# Patient Record
Sex: Female | Born: 1975 | Race: Black or African American | Hispanic: No | Marital: Single | Smoking: Never smoker
Health system: Southern US, Community
[De-identification: ages and names within clinical notes are randomized; demographics above are authoritative.]

## PROBLEM LIST (undated history)

## (undated) DIAGNOSIS — T7840XA Allergy, unspecified, initial encounter: Secondary | ICD-10-CM

## (undated) DIAGNOSIS — E669 Obesity, unspecified: Secondary | ICD-10-CM

## (undated) DIAGNOSIS — I10 Essential (primary) hypertension: Secondary | ICD-10-CM

## (undated) DIAGNOSIS — J45909 Unspecified asthma, uncomplicated: Secondary | ICD-10-CM

## (undated) DIAGNOSIS — M2012 Hallux valgus (acquired), left foot: Secondary | ICD-10-CM

## (undated) DIAGNOSIS — F419 Anxiety disorder, unspecified: Secondary | ICD-10-CM

## (undated) DIAGNOSIS — R7303 Prediabetes: Secondary | ICD-10-CM

## (undated) DIAGNOSIS — K219 Gastro-esophageal reflux disease without esophagitis: Secondary | ICD-10-CM

## (undated) DIAGNOSIS — E559 Vitamin D deficiency, unspecified: Secondary | ICD-10-CM

## (undated) HISTORY — DX: Allergy, unspecified, initial encounter: T78.40XA

## (undated) HISTORY — DX: Unspecified asthma, uncomplicated: J45.909

## (undated) HISTORY — DX: Gastro-esophageal reflux disease without esophagitis: K21.9

## (undated) HISTORY — DX: Prediabetes: R73.03

## (undated) HISTORY — DX: Anxiety disorder, unspecified: F41.9

## (undated) HISTORY — DX: Essential (primary) hypertension: I10

## (undated) HISTORY — DX: Obesity, unspecified: E66.9

## (undated) HISTORY — DX: Vitamin D deficiency, unspecified: E55.9

## (undated) HISTORY — PX: OTHER SURGICAL HISTORY: SHX169

## (undated) HISTORY — PX: WISDOM TOOTH EXTRACTION: SHX21

---

## 2006-11-05 ENCOUNTER — Emergency Department: Payer: Self-pay | Admitting: Emergency Medicine

## 2006-11-15 ENCOUNTER — Emergency Department: Payer: Self-pay | Admitting: Emergency Medicine

## 2010-03-06 ENCOUNTER — Ambulatory Visit: Payer: Self-pay

## 2010-03-08 ENCOUNTER — Ambulatory Visit: Payer: Self-pay

## 2011-06-06 ENCOUNTER — Ambulatory Visit: Payer: Self-pay | Admitting: Internal Medicine

## 2011-06-18 ENCOUNTER — Encounter: Payer: Self-pay | Admitting: Internal Medicine

## 2011-06-18 ENCOUNTER — Ambulatory Visit (INDEPENDENT_AMBULATORY_CARE_PROVIDER_SITE_OTHER): Payer: 59 | Admitting: Internal Medicine

## 2011-06-18 DIAGNOSIS — Z8249 Family history of ischemic heart disease and other diseases of the circulatory system: Secondary | ICD-10-CM | POA: Insufficient documentation

## 2011-06-18 DIAGNOSIS — I10 Essential (primary) hypertension: Secondary | ICD-10-CM

## 2011-06-18 NOTE — Patient Instructions (Signed)
Your physician has requested that you have an echocardiogram. Echocardiography is a painless test that uses sound waves to create images of your heart. It provides your doctor with information about the size and shape of your heart and how well your heart's chambers and valves are working. This procedure takes approximately one hour. There are no restrictions for this procedure.  We will call you with results and will schedule a follow up at that time.

## 2011-06-18 NOTE — Assessment & Plan Note (Signed)
She will follow this along

## 2011-06-18 NOTE — Progress Notes (Signed)
  HPI  Katelyn Wilson is a 35 y.o. female Seen at the request of Dr. Darrick Huntsman because of concerns about hypertrophic cardiomyopathy in the family.  She is a single child of a patient of ours who has a diagnosis of hypertrophic cardiomyopathy with previously implanted ICD and appropriate therapy. She has 2 paternal aunts who have died suddenly one more recently and it is this latter death that precipitates this evaluation.  The patient has hypertension. She denies dyspnea however orthopnea nocturnal dyspnea or palpitations.  She has an episode of syncope remotely that occurred following a procedure. She has had no episodic lightheadedness.  past Medical history is notable for an allergic diathesis  No past medical history on file.  Past Surgical History  Procedure Date  . None     Current Outpatient Prescriptions  Medication Sig Dispense Refill  . ALPRAZolam (XANAX) 0.5 MG tablet Take 0.5 mg by mouth as needed.        . cetirizine (ZYRTEC) 10 MG tablet Take 10 mg by mouth as needed.        Marland Kitchen PROVENTIL HFA 108 (90 BASE) MCG/ACT inhaler Inhale 1 puff into the lungs as needed.        Allergies  Allergen Reactions  . Penicillins    Social history she is single she does not use cigarettes she drinks alcohol occasionally she denies use of recreational drugs.  Family history is positive for [facility] as well as diabetes hypertension and cancer  Review of Systems negative except from HPI and PMH  Physical Exam Well developed and well nourished young [place] American female appearing her stated age moderately obese in no acute distress HENT normal E scleral and icterus clear Neck Supple JVP flat; carotids brisk and full Clear to ausculation Regular rate and rhythm, no murmurs gallops or rub Soft with active bowel sounds No clubbing cyanosis and edema Alert and oriented, grossly normal motor and sensory function Skin Warm and Dry  ECGsinus rhythm at 77 Intervals 0.18/0.08/0.40 Axis  is 21 Otherwise normal  Assessment and  Plan

## 2011-06-18 NOTE — Assessment & Plan Note (Signed)
We will plan to pursue genetic testing in her father and used as a genetic screen for the rest of the family. We'll plan to undertake an echo in the patient to look for evidence of hypertrophic cardiopathy

## 2011-06-24 ENCOUNTER — Other Ambulatory Visit (INDEPENDENT_AMBULATORY_CARE_PROVIDER_SITE_OTHER): Payer: 59 | Admitting: *Deleted

## 2011-06-24 DIAGNOSIS — I369 Nonrheumatic tricuspid valve disorder, unspecified: Secondary | ICD-10-CM

## 2011-06-24 DIAGNOSIS — I379 Nonrheumatic pulmonary valve disorder, unspecified: Secondary | ICD-10-CM

## 2011-06-24 DIAGNOSIS — I10 Essential (primary) hypertension: Secondary | ICD-10-CM

## 2011-06-24 DIAGNOSIS — Z8249 Family history of ischemic heart disease and other diseases of the circulatory system: Secondary | ICD-10-CM

## 2011-07-04 ENCOUNTER — Telehealth: Payer: Self-pay | Admitting: *Deleted

## 2011-07-04 NOTE — Telephone Encounter (Signed)
Called and left msg with pt to call back regarding which genetic testing Dr. Graciela Husbands recommended.

## 2011-07-07 ENCOUNTER — Other Ambulatory Visit: Payer: Self-pay

## 2011-07-07 NOTE — Telephone Encounter (Signed)
Jasmine December spoke to pt, and gave her recommendation per Dr. Graciela Husbands.

## 2011-09-22 ENCOUNTER — Encounter: Payer: Self-pay | Admitting: Internal Medicine

## 2011-09-22 ENCOUNTER — Telehealth: Payer: Self-pay | Admitting: *Deleted

## 2011-09-22 NOTE — Telephone Encounter (Signed)
Notified pt lab results negative for hcm reflex genetic testing. Will contact her father to complete testing.

## 2011-09-23 NOTE — Telephone Encounter (Signed)
thanks

## 2011-10-23 ENCOUNTER — Ambulatory Visit: Payer: Self-pay

## 2011-11-14 ENCOUNTER — Encounter: Payer: Self-pay | Admitting: Internal Medicine

## 2011-11-14 ENCOUNTER — Ambulatory Visit (INDEPENDENT_AMBULATORY_CARE_PROVIDER_SITE_OTHER): Payer: 59 | Admitting: Internal Medicine

## 2011-11-14 ENCOUNTER — Ambulatory Visit: Payer: Self-pay | Admitting: Internal Medicine

## 2011-11-14 VITALS — BP 126/86 | HR 87 | Temp 97.9°F | Resp 16 | Ht 64.0 in | Wt 236.2 lb

## 2011-11-14 DIAGNOSIS — R05 Cough: Secondary | ICD-10-CM

## 2011-11-14 DIAGNOSIS — R059 Cough, unspecified: Secondary | ICD-10-CM

## 2011-11-14 DIAGNOSIS — H6120 Impacted cerumen, unspecified ear: Secondary | ICD-10-CM

## 2011-11-14 DIAGNOSIS — E119 Type 2 diabetes mellitus without complications: Secondary | ICD-10-CM

## 2011-11-14 DIAGNOSIS — I10 Essential (primary) hypertension: Secondary | ICD-10-CM

## 2011-11-14 DIAGNOSIS — Z309 Encounter for contraceptive management, unspecified: Secondary | ICD-10-CM

## 2011-11-14 DIAGNOSIS — J189 Pneumonia, unspecified organism: Secondary | ICD-10-CM

## 2011-11-14 LAB — POCT URINE PREGNANCY: Preg Test, Ur: NEGATIVE

## 2011-11-14 MED ORDER — BENZONATATE 200 MG PO CAPS: 200.0000 mg | ORAL_CAPSULE | Freq: Three times a day (TID) | ORAL | Status: AC | PRN

## 2011-11-14 MED ORDER — NORETHIN ACE-ETH ESTRAD-FE 1-20 MG-MCG(24) PO TABS: 1.0000 | ORAL_TABLET | Freq: Every day | ORAL | Status: DC

## 2011-11-14 MED ORDER — NEBIVOLOL HCL 5 MG PO TABS: 5.0000 mg | ORAL_TABLET | Freq: Every day | ORAL | Status: DC

## 2011-11-14 MED ORDER — DOXYCYCLINE HYCLATE 50 MG PO CAPS: 50.0000 mg | ORAL_CAPSULE | Freq: Two times a day (BID) | ORAL | Status: AC

## 2011-11-14 NOTE — Progress Notes (Signed)
Subjective:    Patient ID: Katelyn Wilson, female    DOB: 1976-03-22, 35 y.o.   MRN: [id number]  HPI  29 you AA female recently evaluated for HOCM due to FH of sudden death (father and paternal aunt), presents for followup on recent treatment for bronchitis .  She was treated on Nov 15 for bronchitis with laryngitis,  With a z pack and prednisone 6 day taper , told to increase her use of inhaler.  Still coughing at night but her wheezing has improved .  She did not have an x ray done.  Past Medical History  Diagnosis Date  . Diabetes mellitus   . Obesity   . Obesity (BMI 30-39.9)   . Anxiety    Current Outpatient Prescriptions on File Prior to Visit  Medication Sig Dispense Refill  . ALPRAZolam (XANAX) 0.5 MG tablet Take 0.5 mg by mouth as needed.        . cetirizine (ZYRTEC) 10 MG tablet Take 10 mg by mouth as needed.        Marland Kitchen PROVENTIL HFA 108 (90 BASE) MCG/ACT inhaler Inhale 1 puff into the lungs as needed.         Review of Systems  Constitutional: Negative for fever, chills and unexpected weight change.  HENT: Positive for rhinorrhea. Negative for hearing loss, ear pain, nosebleeds, congestion, sore throat, facial swelling, sneezing, mouth sores, trouble swallowing, neck pain, neck stiffness, voice change, postnasal drip, sinus pressure, tinnitus and ear discharge.   Eyes: Negative for pain, discharge, redness and visual disturbance.  Respiratory: Positive for cough. Negative for chest tightness, shortness of breath, wheezing and stridor.   Cardiovascular: Negative for chest pain, palpitations and leg swelling.  Musculoskeletal: Negative for myalgias and arthralgias.  Skin: Negative for color change and rash.  Neurological: Negative for dizziness, weakness, light-headedness and headaches.  Hematological: Negative for adenopathy.   BP 126/86  Pulse 87  Temp(Src) 97.9 F (36.6 C) (Oral)  Resp 16  Ht 5\' 4"  (1.626 m)  Wt 236 lb 4 oz (107.162 kg)  BMI 40.55 kg/m2  SpO2 99%  LMP  11/01/2011     Objective:   Physical Exam  Constitutional: She is oriented to person, place, and time. She appears well-developed and well-nourished.  HENT:  Mouth/Throat: Oropharynx is clear and moist.  Eyes: EOM are normal. Pupils are equal, round, and reactive to light. No scleral icterus.  Neck: Normal range of motion. Neck supple. No JVD present. No thyromegaly present.  Cardiovascular: Normal rate, regular rhythm, normal heart sounds and intact distal pulses.   Pulmonary/Chest: Effort normal and breath sounds normal.  Abdominal: Soft. Bowel sounds are normal. She exhibits no mass. There is no tenderness.  Musculoskeletal: Normal range of motion. She exhibits no edema.  Lymphadenopathy:    She has no cervical adenopathy.  Neurological: She is alert and oriented to person, place, and time.  Skin: Skin is warm and dry.  Psychiatric: She has a normal mood and affect.      Assessment & Plan:   Hypertension Not at goal with one medication.  Recent ECHO per patient showed mild ventricular wall thickening,  Will add bystolic.   Cough Persistent for over two weeks .  CXR and one week of empiric doxycycline ordered.  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  Will start low dose Bystolic.   Contraceptive management She is requesting to resume OCPs for contracptive management.  UPT was negative today and she is not a smoker and has no personal history of thrombosis. Rx given.    Updated Medication List Outpatient Encounter Prescriptions as of 11/14/2011  Medication Sig Dispense Refill  . ALPRAZolam (XANAX) 0.5 MG tablet Take 0.5 mg by mouth as needed.        . cetirizine (ZYRTEC) 10 MG tablet Take 10 mg by mouth as needed.        . mometasone (NASONEX) 50 MCG/ACT nasal spray Place 2 sprays into the nose daily.        . Olopatadine HCl (PATANASE) 0.6 % SOLN Place into the nose.         Marland Kitchen PROVENTIL HFA 108 (90 BASE) MCG/ACT inhaler Inhale 1 puff into the lungs as needed.      . benzonatate (TESSALON) 200 MG capsule Take 1 capsule (200 mg total) by mouth 3 (three) times daily as needed for cough.  45 capsule  1  . doxycycline (VIBRAMYCIN) 50 MG capsule Take 1 capsule (50 mg total) by mouth 2 (two) times daily.  14 capsule  0  . nebivolol (BYSTOLIC) 5 MG tablet Take 1 tablet (5 mg total) by mouth daily.  30 tablet  11  . Norethindrone Acetate-Ethinyl Estrad-FE (LOESTRIN 24 FE) 1-20 MG-MCG(24) tablet Take 1 tablet by mouth daily.  1 Package  11  . DISCONTD: Norethindrone Acetate-Ethinyl Estrad-FE (LOESTRIN 24 FE) 1-20 MG-MCG(24) tablet Take 1 tablet by mouth daily.  1 Package  11

## 2011-11-14 NOTE — Patient Instructions (Addendum)
I am treating you for pneumonia because of your persistent symptoms with doxycycline and tessalon for cough   Please go get a chest x ray at your convenience  Start your OCPS on the Sunday after your next menstrual period begins.   We are starting bystolic for your blood pressure

## 2011-11-14 NOTE — Assessment & Plan Note (Signed)
Not at goal with one medication.  Recent ECHO per patient showed mild ventricular wall thickening,  Will add bystolic.

## 2011-11-16 ENCOUNTER — Encounter: Payer: Self-pay | Admitting: Internal Medicine

## 2011-11-16 DIAGNOSIS — R05 Cough: Secondary | ICD-10-CM | POA: Insufficient documentation

## 2011-11-16 DIAGNOSIS — Z309 Encounter for contraceptive management, unspecified: Secondary | ICD-10-CM | POA: Insufficient documentation

## 2011-11-16 DIAGNOSIS — E669 Obesity, unspecified: Secondary | ICD-10-CM | POA: Insufficient documentation

## 2011-11-16 DIAGNOSIS — F419 Anxiety disorder, unspecified: Secondary | ICD-10-CM | POA: Insufficient documentation

## 2011-11-16 DIAGNOSIS — R059 Cough, unspecified: Secondary | ICD-10-CM | POA: Insufficient documentation

## 2011-11-16 DIAGNOSIS — R7303 Prediabetes: Secondary | ICD-10-CM | POA: Insufficient documentation

## 2011-11-16 NOTE — Assessment & Plan Note (Signed)
Her last A1c was 6.2 in July 2012 and urine ws negative for protein.  Reminders for annual eye exam and 3 month followups for diabetes monitoring.

## 2011-11-16 NOTE — Assessment & Plan Note (Addendum)
She is requesting to resume OCPs for contracptive management.  UPT was negative today and she is not a smoker and has no personal history of thrombosis. Rx given.

## 2011-11-16 NOTE — Assessment & Plan Note (Signed)
Persistent for over two weeks .  CXR and one week of empiric doxycycline ordered.

## 2011-11-16 NOTE — Assessment & Plan Note (Addendum)
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  Will start low dose Bystolic.

## 2011-11-17 ENCOUNTER — Telehealth: Payer: Self-pay | Admitting: Internal Medicine

## 2011-11-17 NOTE — Telephone Encounter (Signed)
Message copied by Edd Fabian on Mon Nov 17, 2011  5:01 PM ------      Message from: Duncan Dull      Created: Sun Nov 16, 2011  4:01 PM      Regarding: Needs fasting labs       Please remind Katelyn Wilson that she has not had a hgba1c, urine microalb / cr ratio , fasting lipids, and CMET since July and should have them this week and follow up in 3 months

## 2011-11-17 NOTE — Telephone Encounter (Signed)
Patient will come in Thursday for labs.

## 2011-11-18 ENCOUNTER — Telehealth: Payer: Self-pay | Admitting: Internal Medicine

## 2011-11-18 NOTE — Telephone Encounter (Signed)
Left message notifying patient.

## 2011-11-18 NOTE — Telephone Encounter (Signed)
Chest xray is fine

## 2011-11-20 ENCOUNTER — Other Ambulatory Visit (INDEPENDENT_AMBULATORY_CARE_PROVIDER_SITE_OTHER): Payer: 59 | Admitting: *Deleted

## 2011-11-20 ENCOUNTER — Telehealth: Payer: Self-pay | Admitting: *Deleted

## 2011-11-20 DIAGNOSIS — I10 Essential (primary) hypertension: Secondary | ICD-10-CM

## 2011-11-20 NOTE — Telephone Encounter (Signed)
Patient came in this morning for labs and a micoralbumin was part of the order, but it wasn't collected. Patient says that she will come in the week of Christmas to give [place] urine sample to run the micro. All of the other labs were collected and sent to [facility] at patient's request. I just wanted you to know about the urine.

## 2011-11-20 NOTE — Telephone Encounter (Signed)
Thanks. Hopefully this will save it in chart where I can find it!

## 2011-11-21 LAB — COMPREHENSIVE METABOLIC PANEL
Albumin/Globulin Ratio: 1.4 (ref 1.1–2.5)
Calcium: 8.8 mg/dL (ref 8.7–10.2)
Creatinine, Ser: 0.8 mg/dL (ref 0.57–1.00)
GFR calc non Af Amer: 96 mL/min/{1.73_m2} (ref >59)
Globulin, Total: 2.8 g/dL (ref 1.5–4.5)
Sodium: 136 mmol/L (ref 134–144)
Total Protein: 6.8 g/dL (ref 6.0–8.5)

## 2011-11-21 LAB — LIPID PANEL
Cholesterol, Total: 169 mg/dL (ref 100–199)
VLDL Cholesterol Cal: 23 mg/dL (ref 5–40)

## 2011-11-21 LAB — HEMOGLOBIN A1C: Est. average glucose Bld gHb Est-mCnc: 134 mg/dL

## 2011-12-08 ENCOUNTER — Encounter: Payer: Self-pay | Admitting: Internal Medicine

## 2011-12-11 ENCOUNTER — Encounter: Payer: Self-pay | Admitting: Internal Medicine

## 2011-12-22 ENCOUNTER — Encounter: Payer: Self-pay | Admitting: Internal Medicine

## 2011-12-29 ENCOUNTER — Other Ambulatory Visit: Payer: Self-pay | Admitting: Internal Medicine

## 2011-12-29 DIAGNOSIS — Z309 Encounter for contraceptive management, unspecified: Secondary | ICD-10-CM

## 2011-12-29 MED ORDER — NORETHIN ACE-ETH ESTRAD-FE 1-20 MG-MCG(24) PO TABS: 1.0000 | ORAL_TABLET | Freq: Every day | ORAL | Status: DC

## 2011-12-29 MED ORDER — NEBIVOLOL HCL 5 MG PO TABS: 5.0000 mg | ORAL_TABLET | Freq: Every day | ORAL | Status: DC

## 2011-12-29 NOTE — Telephone Encounter (Signed)
[phone] Pt called her rx company has changed she needs 90 rx for bystolic and lostrine 24   Please advise pt when these are done and she will come by to pick them up

## 2011-12-29 NOTE — Telephone Encounter (Signed)
Patient is now required to have 90 day supply on rxs. She wants to pick these up. Please call when ready.

## 2011-12-30 NOTE — Telephone Encounter (Signed)
Patient needs written script for the bystolic. She needs it for mail order.

## 2011-12-31 ENCOUNTER — Telehealth: Payer: Self-pay | Admitting: *Deleted

## 2011-12-31 DIAGNOSIS — Z309 Encounter for contraceptive management, unspecified: Secondary | ICD-10-CM

## 2011-12-31 MED ORDER — NORETHIN ACE-ETH ESTRAD-FE 1-20 MG-MCG(24) PO TABS: 1.0000 | ORAL_TABLET | Freq: Every day | ORAL | Status: DC

## 2011-12-31 MED ORDER — NEBIVOLOL HCL 5 MG PO TABS: 5.0000 mg | ORAL_TABLET | Freq: Every day | ORAL | Status: DC

## 2011-12-31 NOTE — Telephone Encounter (Signed)
Patient notified that rxs are ready.  

## 2011-12-31 NOTE — Telephone Encounter (Signed)
Pt requests 90 day scripts for mail order, she will pick up when ready.  Note sent to Dr. Darrick Huntsman for approval.

## 2012-01-01 ENCOUNTER — Encounter: Payer: Self-pay | Admitting: Internal Medicine

## 2012-01-01 ENCOUNTER — Ambulatory Visit (INDEPENDENT_AMBULATORY_CARE_PROVIDER_SITE_OTHER): Payer: 59 | Admitting: Internal Medicine

## 2012-01-01 VITALS — BP 108/88 | HR 72 | Temp 99.1°F | Wt 236.0 lb

## 2012-01-01 DIAGNOSIS — M549 Dorsalgia, unspecified: Secondary | ICD-10-CM

## 2012-01-01 DIAGNOSIS — M62830 Muscle spasm of back: Secondary | ICD-10-CM

## 2012-01-01 DIAGNOSIS — M5380 Other specified dorsopathies, site unspecified: Secondary | ICD-10-CM

## 2012-01-01 DIAGNOSIS — E119 Type 2 diabetes mellitus without complications: Secondary | ICD-10-CM

## 2012-01-01 DIAGNOSIS — I10 Essential (primary) hypertension: Secondary | ICD-10-CM

## 2012-01-01 LAB — POCT URINALYSIS DIPSTICK
Bilirubin, UA: NEGATIVE
Glucose, UA: NEGATIVE
Nitrite, UA: NEGATIVE
Urobilinogen, UA: 0.2

## 2012-01-01 MED ORDER — HYDROCHLOROTHIAZIDE 12.5 MG PO TABS: 12.5000 mg | ORAL_TABLET | Freq: Every day | ORAL | Status: DC

## 2012-01-01 MED ORDER — METHOCARBAMOL 750 MG PO TABS: 750.0000 mg | ORAL_TABLET | Freq: Four times a day (QID) | ORAL | Status: AC

## 2012-01-01 NOTE — Progress Notes (Signed)
Subjective:    Patient ID: Katelyn Wilson, female    DOB: Jan 21, 1976, 36 y.o.   MRN: [id number]  HPIr, J. this is a healthy 36 year old African American female with a history of obesity who presents with right-sided back pain lasting one week. Patient states the pain occurred after falling while walking her dog. She fell to her knees and did does remember stretching out the right side since the dog was on a leash the help of the right hand. Since then she's had pain in the upper upper right midthoracic region which is aggravated by lying down at night. It is occasionally stabbing in nature and precipitated by sudden movements or coughing. She denies any history of kidney stones or hematuria and she has no history of abdominal pain or nausea. Past Medical History  Diagnosis Date  . Diabetes mellitus   . Obesity   . Obesity (BMI 30-39.9)   . Anxiety   . Hypertension    Current Outpatient Prescriptions on File Prior to Visit  Medication Sig Dispense Refill  . ALPRAZolam (XANAX) 0.5 MG tablet Take 0.5 mg by mouth as needed.        . cetirizine (ZYRTEC) 10 MG tablet Take 10 mg by mouth as needed.        . mometasone (NASONEX) 50 MCG/ACT nasal spray Place 2 sprays into the nose daily.        . nebivolol (BYSTOLIC) 5 MG tablet Take 1 tablet (5 mg total) by mouth daily.  90 tablet  3  . Norethindrone Acetate-Ethinyl Estrad-FE (LOESTRIN 24 FE) 1-20 MG-MCG(24) tablet Take 1 tablet by mouth daily.  3 Package  3  . Olopatadine HCl (PATANASE) 0.6 % SOLN Place into the nose.        Marland Kitchen PROVENTIL HFA 108 (90 BASE) MCG/ACT inhaler Inhale 1 puff into the lungs as needed.          Review of Systems  Constitutional: Negative for fever, chills and unexpected weight change.  HENT: Negative for hearing loss, ear pain, nosebleeds, congestion, sore throat, facial swelling, rhinorrhea, sneezing, mouth sores, trouble swallowing, neck pain, neck stiffness, voice change, postnasal drip, sinus pressure, tinnitus and ear  discharge.   Eyes: Negative for pain, discharge, redness and visual disturbance.  Respiratory: Negative for cough, chest tightness, shortness of breath, wheezing and stridor.   Cardiovascular: Negative for chest pain, palpitations and leg swelling.  Musculoskeletal: Positive for back pain. Negative for myalgias and arthralgias.  Skin: Negative for color change and rash.  Neurological: Negative for dizziness, weakness, light-headedness and headaches.  Hematological: Negative for adenopathy.       Objective:   Physical Exam  Constitutional: She is oriented to person, place, and time. She appears well-developed and well-nourished.  HENT:  Mouth/Throat: Oropharynx is clear and moist.  Eyes: EOM are normal. Pupils are equal, round, and reactive to light. No scleral icterus.  Neck: Normal range of motion. Neck supple. No JVD present. No thyromegaly present.  Cardiovascular: Normal rate, regular rhythm, normal heart sounds and intact distal pulses.   Pulmonary/Chest: Effort normal and breath sounds normal.  Abdominal: Soft. Bowel sounds are normal. She exhibits no mass. There is no tenderness.  Musculoskeletal: Normal range of motion. She exhibits tenderness. She exhibits no edema.       Arms: Lymphadenopathy:    She has no cervical adenopathy.  Neurological: She is alert and oriented to person, place, and time.  Skin: Skin is warm and dry.  Psychiatric: She has a normal  mood and affect.          Assessment & Plan:   Spasm of muscle, back Secondary to recent fall. Continue nonsteroidal anti-inflammatories and add muscle relaxant. Also try alternating ice and heat for 15 minutes a time to help the muscle flaps.  Hypertension Well controlled on current regimen no changes today.  Diabetes mellitus type 2, diet-controlled Well-controlled on diet alone. Hemoglobin A1c was 6.3 in December. Patient encouraged to continue to exercise and follow glycemic index diet.    Updated  Medication List Outpatient Encounter Prescriptions as of 01/01/2012  Medication Sig Dispense Refill  . ALPRAZolam (XANAX) 0.5 MG tablet Take 0.5 mg by mouth as needed.        . cetirizine (ZYRTEC) 10 MG tablet Take 10 mg by mouth as needed.        . mometasone (NASONEX) 50 MCG/ACT nasal spray Place 2 sprays into the nose daily.        . nebivolol (BYSTOLIC) 5 MG tablet Take 1 tablet (5 mg total) by mouth daily.  90 tablet  3  . Norethindrone Acetate-Ethinyl Estrad-FE (LOESTRIN 24 FE) 1-20 MG-MCG(24) tablet Take 1 tablet by mouth daily.  3 Package  3  . Olopatadine HCl (PATANASE) 0.6 % SOLN Place into the nose.        Marland Kitchen PROVENTIL HFA 108 (90 BASE) MCG/ACT inhaler Inhale 1 puff into the lungs as needed.      . hydrochlorothiazide (HYDRODIURIL) 12.5 MG tablet Take 1 tablet (12.5 mg total) by mouth daily.  30 tablet  3  . methocarbamol (ROBAXIN-750) 750 MG tablet Take 1 tablet (750 mg total) by mouth 4 (four) times daily.  30 tablet  1

## 2012-01-01 NOTE — Patient Instructions (Signed)
You pulled a muscle,  But we are going to be careful and rule out UTI and gallbladder disease before you go.  I will call you with the results.    Ibuprofen 800 mg every 8 hours,  Add tylenol 500 mg to ibuprofern for pain relief  Robaxin,  Muscle relaxer at night

## 2012-01-02 ENCOUNTER — Encounter: Payer: Self-pay | Admitting: Internal Medicine

## 2012-01-02 DIAGNOSIS — M62830 Muscle spasm of back: Secondary | ICD-10-CM | POA: Insufficient documentation

## 2012-01-02 NOTE — Telephone Encounter (Signed)
Pt came in yesterday, picked up scripts.

## 2012-01-02 NOTE — Assessment & Plan Note (Signed)
Secondary to recent fall. Continue nonsteroidal anti-inflammatories and add muscle relaxant. Also try alternating ice and heat for 15 minutes a time to help the muscle flaps.

## 2012-01-02 NOTE — Assessment & Plan Note (Signed)
Well controlled on current regimen.  no changes today.   

## 2012-01-02 NOTE — Assessment & Plan Note (Signed)
Well-controlled on diet alone. Hemoglobin A1c was 6.3 in December. Patient encouraged to continue to exercise and follow glycemic index diet.

## 2012-01-03 LAB — CULTURE, URINE COMPREHENSIVE: Organism ID, Bacteria: NO GROWTH

## 2012-01-14 ENCOUNTER — Telehealth: Payer: Self-pay | Admitting: Internal Medicine

## 2012-01-14 NOTE — Telephone Encounter (Signed)
Advised pt.  She says she is still having some pain from her pulled muscle, I advised her pulled muscles can take  weeks to feel better.

## 2012-01-14 NOTE — Telephone Encounter (Signed)
Her recent labs were normal.

## 2012-01-14 NOTE — Telephone Encounter (Signed)
Left message on machine asking that pt call back.

## 2012-02-13 ENCOUNTER — Encounter: Payer: Self-pay | Admitting: Internal Medicine

## 2012-02-26 ENCOUNTER — Telehealth: Payer: Self-pay | Admitting: Internal Medicine

## 2012-02-26 NOTE — Telephone Encounter (Signed)
Diabetes remains well controlled.She will need to add fasting lipids and urine microalb/cr ratio to next lab draw in 3 months along with cmet and hgba1c. And will need to followup with me then , unless she is scheduled to see me soon.

## 2012-02-27 NOTE — Telephone Encounter (Signed)
Patient notified.  She has an appt next Tuesday.

## 2012-03-02 ENCOUNTER — Encounter: Payer: Self-pay | Admitting: Internal Medicine

## 2012-03-02 ENCOUNTER — Ambulatory Visit (INDEPENDENT_AMBULATORY_CARE_PROVIDER_SITE_OTHER): Payer: 59 | Admitting: Internal Medicine

## 2012-03-02 VITALS — BP 120/84 | HR 88 | Temp 98.3°F | Wt 237.0 lb

## 2012-03-02 DIAGNOSIS — E119 Type 2 diabetes mellitus without complications: Secondary | ICD-10-CM

## 2012-03-02 DIAGNOSIS — E669 Obesity, unspecified: Secondary | ICD-10-CM

## 2012-03-02 DIAGNOSIS — I10 Essential (primary) hypertension: Secondary | ICD-10-CM

## 2012-03-02 MED ORDER — AMLODIPINE BESYLATE 5 MG PO TABS: 5.0000 mg | ORAL_TABLET | Freq: Every day | ORAL | Status: DC

## 2012-03-02 NOTE — Patient Instructions (Signed)
Consider the Low Glycemic Index Diet and 6 smaller meals daily :   7 AM Low carbohydrate Protein  Shakes (EAS Carb Control  Or Atkins ,  Available everywhere,   In  cases at [employer]Js )  2.5 carbs  (Add or substitute a toasted sandwhich thin w/ peanut butter  = 17 net carbs )  10 AM: Protein bar by Atkins (snack size,  Chocolate lover's variety at  [employer])    Lunch: sandwich on pita bread or flatbread (Joseph's makes a pita bread and a flat bread , available at [employer] and [employer]; Toufayah makes a low carb flatbread available at [employer] and [employer]) [employer] and Mission make low carb tortillas whole wheat available at [employer]  3 PM:  Mid day :  Another protein bar,  Or a  cheese stick, fruit (cherries,  Berries, apples,  Kiwi, )  Fruit plus cheese.    6 PM  Dinner:  "mean and green:"  Meat/chicken/fish or pinto beans, salad, and green veggie : use ranch, vinagrette,  [employer], etc  Avoid [place],  [address] and [place].   9 PM snack : Breyer's low carb fudgiscle (100 cal , 3 net carbs)  or  ice cream bar (Carb Smart) Weight Watcher's ice cream bar , or your shake

## 2012-03-06 ENCOUNTER — Encounter: Payer: Self-pay | Admitting: Internal Medicine

## 2012-03-06 NOTE — Progress Notes (Signed)
Patient ID: Katelyn Wilson, female   DOB: 02-12-1976, 36 y.o.   MRN: [id number]     Patient Active Problem List  Diagnoses  . High blood pressure  . Family history of hypertrophic cardiomyopathy  . Hypertension  . Obesity (BMI 30-39.9)  . Anxiety  . Cough  . Contraceptive management  . Diabetes mellitus type 2, diet-controlled  . Back pain  . Spasm of muscle, back    Subjective:  CC:   Chief Complaint  Patient presents with  . Follow-up    HPI:   Katelyn Wilson a 36 y.o. female who presents   for six-month follow up on obesity hypertension and diet-controlled diabetes. At her last visit she was having right thoracic back pain and shoulder pain secondary to a hyperextension event which occurred while walking her dog. Her symptoms have improved but she has recently going detoxing/position the exercise class which has aggravated her symptoms. She denies any numbness or tingling loss of strength and daily pain. Her weight has been stable for the last year this apparently since her BMI is 40 and she is trying to lose weight. Her exercise habits have not been consistent and she is not following a prescribed diet.   Past Medical History  Diagnosis Date  . Diabetes mellitus   . Obesity   . Obesity (BMI 30-39.9)   . Anxiety   . Hypertension     Past Surgical History  Procedure Date  . None          The following portions of the patient's history were reviewed and updated as appropriate: Allergies, current medications, and problem list.    Review of Systems:   12 Pt  review of systems was negative except those addressed in the HPI,     History   Social History  . Marital Status: Single    Spouse Name: N/A    Number of Children: 0  . Years of Education: N/A   Occupational History  . project Surveyor, [employer]   Social History Main Topics  . Smoking status: Never Smoker   . Smokeless tobacco: Never Used  . Alcohol Use: 2.5 oz/week    5 drink(s) per week    occasional  . Drug Use: No  . Sexually Active: Not on file   Other Topics Concern  . Not on file   Social History Narrative  . No narrative on file    Objective:  BP 120/84  Pulse 88  Temp(Src) 98.3 F (36.8 C) (Oral)  Wt 237 lb (107.502 kg)  SpO2 100%  LMP 02/12/2012  General appearance: alert, cooperative and appears stated age Ears: normal TM's and external ear canals both ears Throat: lips, mucosa, and tongue normal; teeth and gums normal Neck: no adenopathy, no carotid bruit, supple, symmetrical, trachea midline and thyroid not enlarged, symmetric, no tenderness/mass/nodules Back: symmetric, no curvature. ROM normal. No CVA tenderness. Lungs: clear to auscultation bilaterally Heart: regular rate and rhythm, S1, S2 normal, no murmur, click, rub or gallop Abdomen: soft, non-tender; bowel sounds normal; no masses,  no organomegaly Pulses: 2+ and symmetric Skin: Skin color, texture, turgor normal. No rashes or lesions Lymph nodes: Cervical, supraclavicular, and axillary nodes normal.  Assessment and Plan:  Obesity (BMI 30-39.9) She has had no improvement overal in reducing BMI due to lack of consistent dietary efforts and exercise.  I have addressed  BMI and recommended a low glycemic index diet utilizing smaller more frequent meals to increase metabolism.  I have  also recommended that patient start exercising with a goal of 30 minutes of aerobic exercise a minimum of 5 days per week. Direct have recommended a goal weight loss of 4 pounds per month and we'll see her back in 3 months..    Diabetes mellitus type 2, diet-controlled Historically. well controlled. Last A1c was 6.3 in December. Triglycerides and LDL were both normal. Is normal renal function and normal foot exam. Reminders for diabetic eye exam to be done annually.    Updated Medication List Outpatient Encounter Prescriptions as of 03/02/2012  Medication Sig Dispense Refill  . ALPRAZolam (XANAX) 0.5 MG  tablet Take 0.5 mg by mouth as needed.        . cetirizine (ZYRTEC) 10 MG tablet Take 10 mg by mouth as needed.        . hydrochlorothiazide (HYDRODIURIL) 12.5 MG tablet Take 1 tablet (12.5 mg total) by mouth daily.  30 tablet  3  . mometasone (NASONEX) 50 MCG/ACT nasal spray Place 2 sprays into the nose daily.        . nebivolol (BYSTOLIC) 5 MG tablet Take 1 tablet (5 mg total) by mouth daily.  90 tablet  3  . Norethindrone Acetate-Ethinyl Estrad-FE (LOESTRIN 24 FE) 1-20 MG-MCG(24) tablet Take 1 tablet by mouth daily.  3 Package  3  . Olopatadine HCl (PATANASE) 0.6 % SOLN Place into the nose.        Marland Kitchen PROVENTIL HFA 108 (90 BASE) MCG/ACT inhaler Inhale 1 puff into the lungs as needed.      Marland Kitchen amLODipine (NORVASC) 5 MG tablet Take 1 tablet (5 mg total) by mouth daily.  90 tablet  3     No orders of the defined types were placed in this encounter.    Return in about 3 months (around 06/02/2012).

## 2012-03-06 NOTE — Assessment & Plan Note (Signed)
Historically. well controlled. Last A1c was 6.3 in December. Triglycerides and LDL were both normal. Is normal renal function and normal foot exam. Reminders for diabetic eye exam to be done annually.

## 2012-03-06 NOTE — Assessment & Plan Note (Signed)
She has had no improvement overal in reducing BMI due to lack of consistent dietary efforts and exercise.  I have addressed  BMI and recommended a low glycemic index diet utilizing smaller more frequent meals to increase metabolism.  I have also recommended that patient start exercising with a goal of 30 minutes of aerobic exercise a minimum of 5 days per week. Direct have recommended a goal weight loss of 4 pounds per month and we'll see her back in 3 months.Marland Kitchen

## 2012-03-09 ENCOUNTER — Encounter: Payer: Self-pay | Admitting: Internal Medicine

## 2012-05-24 ENCOUNTER — Other Ambulatory Visit: Payer: Self-pay | Admitting: Internal Medicine

## 2012-06-02 ENCOUNTER — Ambulatory Visit: Payer: 59 | Admitting: Internal Medicine

## 2012-06-09 ENCOUNTER — Other Ambulatory Visit: Payer: Self-pay | Admitting: Internal Medicine

## 2012-06-09 DIAGNOSIS — E119 Type 2 diabetes mellitus without complications: Secondary | ICD-10-CM

## 2012-06-09 DIAGNOSIS — E785 Hyperlipidemia, unspecified: Secondary | ICD-10-CM

## 2012-06-09 DIAGNOSIS — Z79899 Other long term (current) drug therapy: Secondary | ICD-10-CM

## 2012-06-15 ENCOUNTER — Telehealth: Payer: Self-pay | Admitting: Internal Medicine

## 2012-06-15 NOTE — Telephone Encounter (Signed)
Diabetes is well controlled on diet alone, cholesterol all other labs fine .  Repeat A1c and  CMET in 3 months

## 2012-06-16 NOTE — Telephone Encounter (Signed)
Patient notified

## 2012-06-18 ENCOUNTER — Encounter: Payer: Self-pay | Admitting: Internal Medicine

## 2012-06-18 ENCOUNTER — Ambulatory Visit (INDEPENDENT_AMBULATORY_CARE_PROVIDER_SITE_OTHER): Payer: 59 | Admitting: Internal Medicine

## 2012-06-18 VITALS — BP 110/78 | HR 86 | Temp 98.4°F | Resp 16 | Wt 232.0 lb

## 2012-06-18 DIAGNOSIS — E1169 Type 2 diabetes mellitus with other specified complication: Secondary | ICD-10-CM

## 2012-06-18 DIAGNOSIS — E119 Type 2 diabetes mellitus without complications: Secondary | ICD-10-CM

## 2012-06-18 DIAGNOSIS — E538 Deficiency of other specified B group vitamins: Secondary | ICD-10-CM

## 2012-06-18 DIAGNOSIS — E669 Obesity, unspecified: Secondary | ICD-10-CM

## 2012-06-18 DIAGNOSIS — I10 Essential (primary) hypertension: Secondary | ICD-10-CM

## 2012-06-18 MED ORDER — HYDROCHLOROTHIAZIDE 12.5 MG PO CAPS: 12.5000 mg | ORAL_CAPSULE | ORAL | Status: DC

## 2012-06-18 MED ORDER — AMLODIPINE BESYLATE 5 MG PO TABS: 2.5000 mg | ORAL_TABLET | Freq: Every day | ORAL | Status: DC

## 2012-06-18 MED ORDER — CYANOCOBALAMIN 1000 MCG/ML IJ SOLN
1000.0000 ug | Freq: Once | INTRAMUSCULAR | Status: AC
  Administered 2012-06-18: 1000 ug via INTRAMUSCULAR

## 2012-06-18 NOTE — Patient Instructions (Addendum)
You may decrease the amlodipine to 1/2 tablet daily.  If after one week bp is still < 130/80,  You can stop it.       Consider a Low Glycemic Index Diet and eating 6 smaller meals daily .  This frequent feeding stimulates your metabolism and the lower glycemic index foods will lower your blood sugars:   This is an example of a "Low GI"  Diet:  All of the foods can be found at grocery stores and in bulk at [employer]  club   7 AM Breakfast:  Low carbohydrate Protein  Shakes (I recommend the EAS AdvantEdge "Carb Control" shakes  Or the low carb shakes by Atkins.   Both are available everywhere:  In  cases at [employer]  Or in 4 packs at grocery stores and pharmacies  2.5 carbs  (Alternative is  a toasted Arnold's Sandwhich Thin w/ peanut butter, a Begel Thin with cream cheese and salmon) or  a scrambled egg burrito made with a low carb tortilla .  Avoid cereal and bananas, oatmeal too!   10 AM: Protein bar by Atkins (the snack size,  Many varieties , available widely again)    Lunch: sandwich of [place] avocado and cheese on a lower carbohydrate pita bread, flatbread, or tortilla   (Joseph's makes a pita bread and a flat bread  50 cal and 4 net carbs ; Toufayan makes a low carb flatbread 100 cal and 9 net carbs ) and  Mission makes a low carb whole wheat tortilla  210 cal and 6 net carbs  3 PM:  Mid day :  Another protein bar,  Or a  cheese stick,  Or 1 ounce of  almonds, walnuts, pistachios, pecans, peanuts,  Macadamia nuts. Or low GI fruit serving: cherries,  Berries, whipped cream  6 PM  Dinner:  "mean and green:"  Meat/chicken/fish or a high protein legume; , with a green salad, and a low GI  Veggie (broccoli, cauliflower, green beans, spinach, brussel sprouts. Lima beans) : Avoid "Low fat dressings! They are loaded with sugar! Instead use ranch, vinagrette,  Blue cheese, etc  9 PM snack : Breyer's "low carb" fudgsicle or  ice cream bar (Carb Smart), or  Weight Watcher's ice cream bar , or another protein  shake or a serving of fresh fruit with whipped cream (Avoid bananas, pineapple, grapes  and watermelon on a regular basis because they are high in sugar   Remember that snack Substitutions should be less than 15 to 20 carbs  Per serving. Remember to subtract fiber grams to get the "net carbs."

## 2012-06-18 NOTE — Progress Notes (Signed)
Patient ID: Katelyn Wilson, female   DOB: [birth year], 36 y.o.   MRN: [id number] Patient Active Problem List  Diagnosis  . High blood pressure  . Family history of hypertrophic cardiomyopathy  . Obesity (BMI 30-39.9)  . Anxiety  . Contraceptive management  . Diabetes mellitus type 2, diet-controlled    Subjective:  CC:   Chief Complaint  Patient presents with  . Follow-up    3 month    HPI:   Katelyn L Chavisis a 36 y.o. female who presents for follow up on diet controlled diabetes, obesity, etc.  Recent labs done at [facility] are not available at time of visit but were reviewed several days ago by me and were reported as normal, well controlled, with a1c < 7.0. She has lost 5 lbs, which she attributes to initiation of an exercise program .  She she reports that up until 3 weeks ago when it started to rain a daily basis she was walking at lunchtime to 3 miles per day. She has not changed her diet very much due to her schedule and her inability to plan her meals and advance. She does not check her blood sugars on a regular basis. She denies any joint pain shortness of breath skin changes or heat or cold intolerance. She has no trouble with insomnia averages 6-8 hours of sleep per night. She admits that the low glycemic index diet that I have laid out for her at prior visit seemed impossible for her and she is more inclined to follow a Weight Watchers type of regimen, as that has worked for her in the past.   Past Medical History  Diagnosis Date  . Diabetes mellitus   . Obesity   . Obesity (BMI 30-39.9)   . Anxiety   . Hypertension     Past Surgical History  Procedure Date  . None          The following portions of the patient's history were reviewed and updated as appropriate: Allergies, current medications, and problem list.    Review of Systems:   12 Pt  review of systems was negative except those addressed in the HPI,     History   Social History  . Marital Status:  Single    Spouse Name: N/A    Number of Children: 0  . Years of Education: N/A   Occupational History  . project Surveyor, quantity   Social History Main Topics  . Smoking status: Never Smoker   . Smokeless tobacco: Never Used  . Alcohol Use: 2.5 oz/week    5 drink(s) per week     occasional  . Drug Use: No  . Sexually Active: Not on file   Other Topics Concern  . Not on file   Social History Narrative  . No narrative on file    Objective:  BP 110/78  Pulse 86  Temp 98.4 F (36.9 C) (Oral)  Resp 16  Wt 232 lb (105.235 kg)  SpO2 97%  LMP 05/23/2012  General appearance: alert, cooperative and appears stated age Ears: normal TM's and external ear canals both ears Throat: lips, mucosa, and tongue normal; teeth and gums normal Neck: no adenopathy, no carotid bruit, supple, symmetrical, trachea midline and thyroid not enlarged, symmetric, no tenderness/mass/nodules Back: symmetric, no curvature. ROM normal. No CVA tenderness. Lungs: clear to auscultation bilaterally Heart: regular rate and rhythm, S1, S2 normal, no murmur, click, rub or gallop Abdomen: soft, non-tender; bowel sounds normal; no masses,  no  organomegaly Pulses: 2+ and symmetric Skin: Skin color, texture, turgor normal. No rashes or lesions Lymph nodes: Cervical, supraclavicular, and axillary nodes normal.  Assessment and Plan:  High blood pressure Improved compared to last time. She is motivated to stop using medication but only when directed. We discussed decreasing her amlodipine dose by 50% today as long as her blood pressures remain less than 130 systolic I recommend he continue hydrochlorothiazide to manage any fluid retention and bloating might be causing her.  Obesity (BMI 30-39.9) With diet-controlled diabetes is complication. Secondary lifestyle environment. He continue to recommend local estimated index diet but have encouraged her to find her own version of this whether the weight watchers  manifested persistent or another outlines diet. She has lost 5 pounds. I recommended that she could be more consistent with her exercise with a goal of 30 minutes 5 times a week of aerobic exercise.  Diabetes mellitus type 2, diet-controlled Well-controlled with diet alone. Given her hypertension she will need a repeat urinalysis for protein and a change in medication to ACE inhibitor if positive prior tests have been negative. Given her age I'm reluctant to start a statin for her LDL goal of 70 or less. Depending on her current LDL we'll recommend diet versus trial of red yeast rice.   Updated Medication List Outpatient Encounter Prescriptions as of 06/18/2012  Medication Sig Dispense Refill  . ALPRAZolam (XANAX) 0.5 MG tablet Take 0.5 mg by mouth as needed.        Marland Kitchen amLODipine (NORVASC) 5 MG tablet Take 0.5 tablets (2.5 mg total) by mouth daily.  30 tablet  3  . cetirizine (ZYRTEC) 10 MG tablet Take 10 mg by mouth as needed.        . hydrochlorothiazide (MICROZIDE) 12.5 MG capsule Take 1 capsule (12.5 mg total) by mouth every morning.  30 capsule  2  . mometasone (NASONEX) 50 MCG/ACT nasal spray Place 2 sprays into the nose daily.        . Olopatadine HCl (PATANASE) 0.6 % SOLN Place into the nose.        Marland Kitchen PROVENTIL HFA 108 (90 BASE) MCG/ACT inhaler Inhale 1 puff into the lungs as needed.      Marland Kitchen DISCONTD: amLODipine (NORVASC) 5 MG tablet Take 1 tablet (5 mg total) by mouth daily.  90 tablet  3  . DISCONTD: hydrochlorothiazide (MICROZIDE) 12.5 MG capsule TAKE ONE CAPSULE BY MOUTH ONE TIME DAILY  30 capsule  2  . DISCONTD: nebivolol (BYSTOLIC) 5 MG tablet Take 1 tablet (5 mg total) by mouth daily.  90 tablet  3  . DISCONTD: Norethindrone Acetate-Ethinyl Estrad-FE (LOESTRIN 24 FE) 1-20 MG-MCG(24) tablet Take 1 tablet by mouth daily.  3 Package  3  . cyanocobalamin ((VITAMIN B-12)) injection 1,000 mcg          Orders Placed This Encounter  Procedures  . COMPLETE METABOLIC PANEL WITH GFR  .  Hemoglobin A1c    No Follow-up on file.

## 2012-06-20 ENCOUNTER — Encounter: Payer: Self-pay | Admitting: Internal Medicine

## 2012-06-20 NOTE — Assessment & Plan Note (Addendum)
Improved compared to last time. She is motivated to stop using medication but only when directed. We discussed decreasing her amlodipine dose by 50% today as long as her blood pressures remain less than 130 systolic I recommend he continue hydrochlorothiazide to manage any fluid retention and bloating might be causing her.

## 2012-06-20 NOTE — Assessment & Plan Note (Signed)
Well-controlled with diet alone. Given her hypertension she will need a repeat urinalysis for protein and a change in medication to ACE inhibitor if positive prior tests have been negative. Given her age I'm reluctant to start a statin for her LDL goal of 70 or less. Depending on her current LDL we'll recommend diet versus trial of red yeast rice.

## 2012-06-20 NOTE — Assessment & Plan Note (Signed)
With diet-controlled diabetes is complication. Secondary lifestyle environment. He continue to recommend local estimated index diet but have encouraged her to find her own version of this whether the weight watchers manifested persistent or another outlines diet. She has lost 5 pounds. I recommended that she could be more consistent with her exercise with a goal of 30 minutes 5 times a week of aerobic exercise.

## 2012-06-24 ENCOUNTER — Encounter: Payer: Self-pay | Admitting: Internal Medicine

## 2012-08-05 ENCOUNTER — Other Ambulatory Visit: Payer: Self-pay | Admitting: *Deleted

## 2012-08-05 NOTE — Telephone Encounter (Signed)
Opened in error

## 2012-08-31 ENCOUNTER — Encounter: Payer: Self-pay | Admitting: Internal Medicine

## 2012-08-31 ENCOUNTER — Ambulatory Visit (INDEPENDENT_AMBULATORY_CARE_PROVIDER_SITE_OTHER): Payer: 59 | Admitting: Internal Medicine

## 2012-08-31 VITALS — BP 106/78 | HR 102 | Temp 98.0°F | Resp 18 | Wt 233.8 lb

## 2012-08-31 DIAGNOSIS — I10 Essential (primary) hypertension: Secondary | ICD-10-CM

## 2012-08-31 DIAGNOSIS — J069 Acute upper respiratory infection, unspecified: Secondary | ICD-10-CM

## 2012-08-31 DIAGNOSIS — E119 Type 2 diabetes mellitus without complications: Secondary | ICD-10-CM

## 2012-08-31 MED ORDER — ALBUTEROL SULFATE HFA 108 (90 BASE) MCG/ACT IN AERS: 1.0000 | INHALATION_SPRAY | Freq: Four times a day (QID) | RESPIRATORY_TRACT | Status: DC

## 2012-08-31 MED ORDER — AZITHROMYCIN 500 MG PO TABS: 500.0000 mg | ORAL_TABLET | Freq: Every day | ORAL | Status: DC

## 2012-08-31 MED ORDER — HYDROCHLOROTHIAZIDE 12.5 MG PO CAPS: 12.5000 mg | ORAL_CAPSULE | ORAL | Status: DC

## 2012-08-31 MED ORDER — AMLODIPINE BESYLATE 5 MG PO TABS: 2.5000 mg | ORAL_TABLET | Freq: Every day | ORAL | Status: DC

## 2012-08-31 NOTE — Progress Notes (Signed)
Patient ID: Katelyn Wilson, female   DOB: Jul 21, 1976, 36 y.o.   MRN: [id number] Patient Active Problem List  Diagnosis  . High blood pressure  . Family history of hypertrophic cardiomyopathy  . Obesity (BMI 30-39.9)  . Anxiety  . Contraceptive management  . Diabetes mellitus type 2, diet-controlled  . URI (upper respiratory infection)    Subjective:  CC:   Chief Complaint  Patient presents with  . Sinusitis  . Cough    HPI:   Katelyn L Chavisis a 36 y.o. female who presents with a 5 day history of rhinitis,  sore throat, sinus congestion.  No fevers, myalgias, purulent sinus drainage. Minimal cough.  Has history of allergic rhinits and uses steroid nasal spray and eye drops regularly.  Past Medical History  Diagnosis Date  . Diabetes mellitus   . Obesity   . Obesity (BMI 30-39.9)   . Anxiety   . Hypertension   . allergic rhinitis     Past Surgical History  Procedure Date  . None     The following portions of the patient's history were reviewed and updated as appropriate: Allergies, current medications, and problem list.    Review of Systems:   12 Pt  review of systems was negative except those addressed in the HPI,     History   Social History  . Marital Status: Single    Spouse Name: N/A    Number of Children: 0  . Years of Education: N/A   Occupational History  . project Surveyor, quantity   Social History Main Topics  . Smoking status: Never Smoker   . Smokeless tobacco: Never Used  . Alcohol Use: 2.5 oz/week    5 drink(s) per week     occasional  . Drug Use: No  . Sexually Active: Not on file   Other Topics Concern  . Not on file   Social History Narrative  . No narrative on file    Objective:  BP 106/78  Pulse 102  Temp 98 F (36.7 C) (Oral)  Resp 18  Wt 233 lb 12 oz (106.028 kg)  SpO2 97%  LMP 08/14/2012  General appearance: alert, cooperative and appears stated age Ears: normal TM's and external ear canals both ears Throat:  lips, mucosa, and tongue normal; teeth and gums normal Neck: no adenopathy, no carotid bruit, supple, symmetrical, trachea midline and thyroid not enlarged, symmetric, no tenderness/mass/nodules Back: symmetric, no curvature. ROM normal. No CVA tenderness. Lungs: clear to auscultation bilaterally Heart: regular rate and rhythm, S1, S2 normal, no murmur, click, rub or gallop Abdomen: soft, non-tender; bowel sounds normal; no masses,  no organomegaly Pulses: 2+ and symmetric Skin: Skin color, texture, turgor normal. No rashes or lesions Lymph nodes: Cervical, supraclavicular, and axillary nodes normal.  Assessment and Plan:  Diabetes mellitus type 2, diet-controlled Repeat hgba1c is due. Last one was 6.2   URI (upper respiratory infection) .This URI is most likely viral   I have explained that in viral URIS, an antibiotic will not help the symptoms and will increase the risk of developing diarrhea.,  Continue oral and nasal decongestants,  Ibuprofen 400 mg and tylenol 650 mq 8 hrs for aches and pains,    And abx only if symptoms worsen to include fevers, facial pain, purulent sputum./drainage.   High blood pressure She has resumed amlodipine for elevated bp at recent health screening.    Updated Medication List Outpatient Encounter Prescriptions as of 08/31/2012  Medication Sig Dispense Refill  .  albuterol (PROVENTIL HFA) 108 (90 BASE) MCG/ACT inhaler Inhale 1 puff into the lungs 4 (four) times daily.  8.5 g  6  . ALPRAZolam (XANAX) 0.5 MG tablet Take 0.5 mg by mouth as needed.        Marland Kitchen amLODipine (NORVASC) 5 MG tablet Take 0.5 tablets (2.5 mg total) by mouth daily.  30 tablet  0  . cetirizine (ZYRTEC) 10 MG tablet Take 10 mg by mouth as needed.        . hydrochlorothiazide (MICROZIDE) 12.5 MG capsule Take 1 capsule (12.5 mg total) by mouth every morning.  90 capsule  3  . mometasone (NASONEX) 50 MCG/ACT nasal spray Place 2 sprays into the nose daily.        . Olopatadine HCl (PATANASE)  0.6 % SOLN Place into the nose.        Marland Kitchen DISCONTD: amLODipine (NORVASC) 5 MG tablet Take 0.5 tablets (2.5 mg total) by mouth daily.  30 tablet  3  . DISCONTD: amLODipine (NORVASC) 5 MG tablet Take 0.5 tablets (2.5 mg total) by mouth daily.  90 tablet  3  . DISCONTD: hydrochlorothiazide (MICROZIDE) 12.5 MG capsule Take 1 capsule (12.5 mg total) by mouth every morning.  30 capsule  2  . DISCONTD: PROVENTIL HFA 108 (90 BASE) MCG/ACT inhaler Inhale 1 puff into the lungs as needed.      Marland Kitchen azithromycin (ZITHROMAX) 500 MG tablet Take 1 tablet (500 mg total) by mouth daily.  7 tablet  0     No orders of the defined types were placed in this encounter.    No Follow-up on file.

## 2012-08-31 NOTE — Assessment & Plan Note (Signed)
Repeat hgba1c is due. Last one was 6.2

## 2012-08-31 NOTE — Patient Instructions (Addendum)
You have a viral  Syndrome .  The post nasal drip is causing your sore throat.  Lavage your sinuses twice daly with Simply saline nasal spray.  Use benadryl 25 mg every 8 hours and Sudafed PE 10 to 30 every 8 hours to manage the drainage and congestion.  Gargle with salt water often for the sore throat.  I am calling in Cheratussin cough syrup (has codeine) for the cough.  If the throat is no better  In 3 to 4 days OR  if you develop T > 100.4,  Green nasal discharge,  Or facial pain,  Call for an antibiotic.

## 2012-08-31 NOTE — Assessment & Plan Note (Signed)
.  This URI is most likely viral   I have explained that in viral URIS, an antibiotic will not help the symptoms and will increase the risk of developing diarrhea.,  Continue oral and nasal decongestants,  Ibuprofen 400 mg and tylenol 650 mq 8 hrs for aches and pains,    And abx only if symptoms worsen to include fevers, facial pain, purulent sputum./drainage.

## 2012-08-31 NOTE — Assessment & Plan Note (Signed)
She has resumed amlodipine for elevated bp at recent health screening.

## 2012-09-08 ENCOUNTER — Telehealth: Payer: Self-pay | Admitting: Internal Medicine

## 2012-09-08 LAB — HM PAP SMEAR: HM Pap smear: NEGATIVE

## 2012-09-08 NOTE — Telephone Encounter (Signed)
Her labs have been ordered since July.  Please clarify request. Did they not receive them?  Because we do not fax,  We use the [facility] interface,.

## 2012-09-08 NOTE — Telephone Encounter (Signed)
Patient is needing lab order faxed to [facility] [phone]

## 2012-09-08 NOTE — Telephone Encounter (Signed)
Patient needs written lab order to be faxed to [facility] at [phone]. Thanks!

## 2012-09-09 ENCOUNTER — Telehealth: Payer: Self-pay | Admitting: Internal Medicine

## 2012-09-09 NOTE — Telephone Encounter (Signed)
Message left notifying patient to call and schedule lab appt due to interface with [facility].

## 2012-09-09 NOTE — Telephone Encounter (Signed)
Pt wants to go [facility] for her labs Pt would like to go Monday or tuesday

## 2012-09-17 ENCOUNTER — Other Ambulatory Visit: Payer: Self-pay | Admitting: Internal Medicine

## 2012-09-20 ENCOUNTER — Ambulatory Visit: Payer: 59 | Admitting: Internal Medicine

## 2012-09-20 LAB — COMPREHENSIVE METABOLIC PANEL
Albumin: 4.2 g/dL (ref 3.5–5.5)
BUN: 7 mg/dL (ref 6–20)
CO2: 28 mmol/L (ref 19–28)
Calcium: 9.4 mg/dL (ref 8.7–10.2)
Chloride: 99 mmol/L (ref 97–108)
GFR calc Af Amer: 106 mL/min/{1.73_m2} (ref >59)
Glucose: 121 mg/dL — ABNORMAL HIGH (ref 65–99)
Total Bilirubin: 0.2 mg/dL (ref 0.0–1.2)
Total Protein: 7 g/dL (ref 6.0–8.5)

## 2012-09-20 LAB — LIPID PANEL W/O CHOL/HDL RATIO
Cholesterol, Total: 160 mg/dL (ref 100–199)
HDL: 55 mg/dL (ref >39)
LDL Calculated: 87 mg/dL (ref 0–99)
Triglycerides: 90 mg/dL (ref 0–149)
VLDL Cholesterol Cal: 18 mg/dL (ref 5–40)

## 2012-09-20 LAB — MICROALBUMIN / CREATININE URINE RATIO
Creatinine, Ur: 104.5 mg/dL (ref 16.0–327.0)
Microalbumin, Urine: <1 ug/mL (ref 0.0–17.0)

## 2012-10-07 ENCOUNTER — Telehealth: Payer: Self-pay | Admitting: Internal Medicine

## 2012-10-07 NOTE — Telephone Encounter (Signed)
Spoke to patient, she will make an appt for 3 months.

## 2012-10-07 NOTE — Telephone Encounter (Signed)
Pt called stating she received her labs in mail.  Pt wanted to know when she needed to come back

## 2013-06-22 ENCOUNTER — Ambulatory Visit (INDEPENDENT_AMBULATORY_CARE_PROVIDER_SITE_OTHER): Payer: 59 | Admitting: Internal Medicine

## 2013-06-22 ENCOUNTER — Encounter: Payer: Self-pay | Admitting: Internal Medicine

## 2013-06-22 VITALS — BP 106/82 | HR 78 | Temp 98.2°F | Resp 14 | Wt 229.2 lb

## 2013-06-22 DIAGNOSIS — E669 Obesity, unspecified: Secondary | ICD-10-CM

## 2013-06-22 DIAGNOSIS — E119 Type 2 diabetes mellitus without complications: Secondary | ICD-10-CM

## 2013-06-22 DIAGNOSIS — D259 Leiomyoma of uterus, unspecified: Secondary | ICD-10-CM

## 2013-06-22 DIAGNOSIS — I10 Essential (primary) hypertension: Secondary | ICD-10-CM

## 2013-06-22 MED ORDER — ALPRAZOLAM 0.5 MG PO TABS: 0.5000 mg | ORAL_TABLET | ORAL | Status: DC | PRN

## 2013-06-22 NOTE — Patient Instructions (Addendum)
You are doing great!  Our goal is to get weight under 200; then we will suspend your bp meds to see if you still need them.  We are checking hgba1c,  CMET and iron studies  Return in 3 months

## 2013-06-22 NOTE — Progress Notes (Signed)
Patient ID: Katelyn Wilson, female   DOB: Apr 30, 1976, 37 y.o.   MRN: [id number]  Patient Active Problem List   Diagnosis Date Noted  . Fibroid uterus 06/22/2013  . Contraceptive management 11/16/2011  . Diabetes mellitus type 2, diet-controlled 11/16/2011  . Obesity (BMI 30-39.9)   . Anxiety   . High blood pressure 06/18/2011  . Family history of hypertrophic cardiomyopathy 06/18/2011    Subjective:  CC:   Chief Complaint  Patient presents with  . Follow-up    BP medication    HPI:   Katelyn Wilson a 37 y.o. female who presents Follow up on chronic conditions including DM, diet controlled, HTN,  GAD,  And obesity. She is actively trying to reduce her weight using  a web based interactive program called [employer] fitness pal .  She is exercising 3 times per week using the T 25 workout program .  Has reduced the starches in her diet. She is not checking her blood sugars since her diabetes has been diet controlled. She has lost 4 pounds since September. She has no joint pain.   Past Medical History  Diagnosis Date  . Diabetes mellitus   . Obesity   . Obesity (BMI 30-39.9)   . Anxiety   . Hypertension   . allergic rhinitis     Past Surgical History  Procedure Laterality Date  . None         The following portions of the patient's history were reviewed and updated as appropriate: Allergies, current medications, and problem list.    Review of Systems:   Patient denies headache, fevers, malaise, unintentional weight loss, skin rash, eye pain, sinus congestion and sinus pain, sore throat, dysphagia,  hemoptysis , cough, dyspnea, wheezing, chest pain, palpitations, orthopnea, edema, abdominal pain, nausea, melena, diarrhea, constipation, flank pain, dysuria, hematuria, urinary  Frequency, nocturia, numbness, tingling, seizures,  Focal weakness, Loss of consciousness,  Tremor, insomnia, depression, anxiety, and suicidal ideation.     History   Social History  . Marital  Status: Single    Spouse Name: N/A    Number of Children: 0  . Years of Education: N/A   Occupational History  . project Surveyor, quantity   Social History Main Topics  . Smoking status: Never Smoker   . Smokeless tobacco: Never Used  . Alcohol Use: 2.5 oz/week    5 drink(s) per week     Comment: occasional  . Drug Use: No  . Sexually Active: Not on file   Other Topics Concern  . Not on file   Social History Narrative  . No narrative on file    Objective:  BP 106/82  Pulse 78  Temp(Src) 98.2 F (36.8 C) (Oral)  Resp 14  Wt 229 lb 4 oz (103.987 kg)  BMI 39.33 kg/m2  SpO2 97%  LMP 01/23/2013  General appearance: alert, cooperative and appears stated age Ears: normal TM's and external ear canals both ears Throat: lips, mucosa, and tongue normal; teeth and gums normal Neck: no adenopathy, no carotid bruit, supple, symmetrical, trachea midline and thyroid not enlarged, symmetric, no tenderness/mass/nodules Back: symmetric, no curvature. ROM normal. No CVA tenderness. Lungs: clear to auscultation bilaterally Heart: regular rate and rhythm, S1, S2 normal, no murmur, click, rub or gallop Abdomen: soft, non-tender; bowel sounds normal; no masses,  no organomegaly Pulses: 2+ and symmetric Skin: Skin color, texture, turgor normal. No rashes or lesions Lymph nodes: Cervical, supraclavicular, and axillary nodes normal. Foot exam:  Nails are  well trimmed,  No callouses,  Sensation intact to microfilament   Assessment and Plan:  Diabetes mellitus type 2, diet-controlled She is overdue for hemoglobin A1c. Last one was 10.1 in October. Reminded to get diabetic eye exam done. Low glycemic index diet reviewed with patient. Recommended to increase her exercise to 5 days per week for 30 minutes of aerobic exercise.  Obesity (BMI 30-39.9) I have spent 20 minutes addressing her   BMI and recommended a low glycemic index diet utilizing smaller more frequent meals to increase  metabolism.  I have also recommended that patient start exercising with a goal of 30 minutes of aerobic exercise a minimum of 5 days per week.    High blood pressure Controlled with amlodipine and hydrochlorothiazide.  A total of 40 minutes was spent with patient more than half of which was spent in counseling, reviewing records from other prviders and coordination of care.  Updated Medication List Outpatient Encounter Prescriptions as of 06/22/2013  Medication Sig Dispense Refill  . albuterol (PROVENTIL HFA) 108 (90 BASE) MCG/ACT inhaler Inhale 1 puff into the lungs 4 (four) times daily.  8.5 g  6  . amLODipine (NORVASC) 5 MG tablet Take 0.5 tablets (2.5 mg total) by mouth daily.  30 tablet  0  . cetirizine (ZYRTEC) 10 MG tablet Take 10 mg by mouth as needed.        . hydrochlorothiazide (MICROZIDE) 12.5 MG capsule Take 1 capsule (12.5 mg total) by mouth every morning.  90 capsule  3  . mometasone (NASONEX) 50 MCG/ACT nasal spray Place 2 sprays into the nose daily.        . norethindrone-ethinyl estradiol (GILDESS 1/20) 1-20 MG-MCG tablet Take 1 tablet by mouth daily.      . Olopatadine HCl (PATANASE) 0.6 % SOLN Place into the nose.        Marland Kitchen ALPRAZolam (XANAX) 0.5 MG tablet Take 1 tablet (0.5 mg total) by mouth as needed for sleep or anxiety.  30 tablet  4  . azithromycin (ZITHROMAX) 500 MG tablet Take 1 tablet (500 mg total) by mouth daily.  7 tablet  0  . [DISCONTINUED] ALPRAZolam (XANAX) 0.5 MG tablet Take 0.5 mg by mouth as needed.         No facility-administered encounter medications on file as of 06/22/2013.     Orders Placed This Encounter  Procedures  . HM DIABETES FOOT EXAM    No Follow-up on file.

## 2013-06-23 ENCOUNTER — Encounter: Payer: Self-pay | Admitting: Internal Medicine

## 2013-06-24 ENCOUNTER — Encounter: Payer: Self-pay | Admitting: Emergency Medicine

## 2013-06-24 NOTE — Assessment & Plan Note (Addendum)
I have spent 20 minutes addressing her   BMI and recommended a low glycemic index diet utilizing smaller more frequent meals to increase metabolism.  I have also recommended that patient start exercising with a goal of 30 minutes of aerobic exercise a minimum of 5 days per week.

## 2013-06-24 NOTE — Assessment & Plan Note (Signed)
Controlled with amlodipine and hydrochlorothiazide.

## 2013-06-24 NOTE — Assessment & Plan Note (Signed)
She is overdue for hemoglobin A1c. Last one was 10.1 in October. Reminded to get diabetic eye exam done. Low glycemic index diet reviewed with patient. Recommended to increase her exercise to 5 days per week for 30 minutes of aerobic exercise.

## 2013-06-29 ENCOUNTER — Encounter: Payer: Self-pay | Admitting: Internal Medicine

## 2013-06-29 ENCOUNTER — Telehealth: Payer: Self-pay | Admitting: Internal Medicine

## 2013-06-29 DIAGNOSIS — E119 Type 2 diabetes mellitus without complications: Secondary | ICD-10-CM

## 2013-06-29 NOTE — Assessment & Plan Note (Signed)
a1c 6.08 June 2013

## 2013-06-29 NOTE — Telephone Encounter (Signed)
Left message for patient to return call to office. 

## 2013-06-29 NOTE — Telephone Encounter (Signed)
Not taking iron supplement but she believes her birth control has some iron in it. Placed lab order up front for patient to pick up as requested.

## 2013-06-29 NOTE — Telephone Encounter (Signed)
[facility] labs received.  Her A1c is 6.2 indicating that her diabetes is well-controlled. However she has not had fasting lipids or a urine microscopic protein analysis in over a year. She needs to do these before next visit .  she will need an order for [facility]. I will print out a letter.   Problem #2 she is not anemic but her iron stores were borderline. Is she taking an iron supplement?

## 2013-06-29 NOTE — Telephone Encounter (Signed)
i suggest repeat the iron studies in 6 months continue current iron in birth control

## 2013-07-05 NOTE — Telephone Encounter (Signed)
Left message to call office

## 2013-07-06 NOTE — Telephone Encounter (Signed)
Message left on mobile per patients request.

## 2013-07-13 ENCOUNTER — Encounter: Payer: Self-pay | Admitting: Internal Medicine

## 2013-07-15 ENCOUNTER — Other Ambulatory Visit: Payer: Self-pay | Admitting: *Deleted

## 2013-07-17 MED ORDER — ALPRAZOLAM 0.5 MG PO TABS: 0.5000 mg | ORAL_TABLET | ORAL | Status: DC | PRN

## 2013-07-18 NOTE — Telephone Encounter (Signed)
Script faxed as requested.

## 2013-08-15 ENCOUNTER — Encounter: Payer: Self-pay | Admitting: Internal Medicine

## 2013-09-08 ENCOUNTER — Encounter: Payer: Self-pay | Admitting: Internal Medicine

## 2013-09-23 ENCOUNTER — Ambulatory Visit: Payer: 59 | Admitting: Internal Medicine

## 2013-09-27 NOTE — Telephone Encounter (Signed)
Faxed 08/10/13

## 2013-10-07 ENCOUNTER — Ambulatory Visit: Payer: 59 | Admitting: Internal Medicine

## 2013-10-13 ENCOUNTER — Other Ambulatory Visit: Payer: Self-pay

## 2013-10-16 ENCOUNTER — Other Ambulatory Visit: Payer: Self-pay | Admitting: Internal Medicine

## 2013-10-20 ENCOUNTER — Encounter: Payer: Self-pay | Admitting: *Deleted

## 2013-10-21 ENCOUNTER — Ambulatory Visit (INDEPENDENT_AMBULATORY_CARE_PROVIDER_SITE_OTHER): Payer: 59 | Admitting: Internal Medicine

## 2013-10-21 ENCOUNTER — Encounter: Payer: Self-pay | Admitting: Internal Medicine

## 2013-10-21 VITALS — BP 124/72 | HR 76 | Temp 98.1°F | Resp 12 | Ht 64.0 in | Wt 234.2 lb

## 2013-10-21 DIAGNOSIS — I10 Essential (primary) hypertension: Secondary | ICD-10-CM

## 2013-10-21 DIAGNOSIS — E119 Type 2 diabetes mellitus without complications: Secondary | ICD-10-CM

## 2013-10-21 MED ORDER — HYDROCHLOROTHIAZIDE 12.5 MG PO CAPS: 12.5000 mg | ORAL_CAPSULE | ORAL | Status: DC

## 2013-10-21 MED ORDER — AMLODIPINE BESYLATE 5 MG PO TABS: ORAL_TABLET | ORAL | Status: DC

## 2013-10-21 NOTE — Progress Notes (Signed)
Patient ID: Katelyn Wilson, female   DOB: 1976/04/17, 37 y.o.   MRN: [id number]   Patient Active Problem List   Diagnosis Date Noted  . Severe obesity (BMI >= 40) 10/23/2013  . Fibroid uterus 06/22/2013  . Contraceptive management 11/16/2011  . Type II or unspecified type diabetes mellitus without mention of complication, not stated as uncontrolled 11/16/2011  . Anxiety   . High blood pressure 06/18/2011  . Family history of hypertrophic cardiomyopathy 06/18/2011    Subjective:  CC:   Chief Complaint  Patient presents with  . Follow-up    3 month    HPI:   Katelyn L Chavisis a 37 y.o. female who presents for Follow up on diet controlled diabetes mellitus, hypertension and obesity.    Cc: Scratchy throat for the last 2 days , multiple sick contacts at work .  Denies fevers, myalgias and cough but has had some rhinitis.   DM:  Her last diabetic eye exam was in July by Dr. Alvester Morin. She has had prior Lasik procedure.  She has not been following a low GI diet or exercising for the last 4 weeks after stopping to recover from a viral URI.  Her schedule has also become complicated bc she is taking  a night time class for work.     Past Medical History  Diagnosis Date  . Diabetes mellitus   . Obesity   . Obesity (BMI 30-39.9)   . Anxiety   . Hypertension   . allergic rhinitis     Past Surgical History  Procedure Laterality Date  . None         The following portions of the patient's history were reviewed and updated as appropriate: Allergies, current medications, and problem list.    Review of Systems:   12 Pt  review of systems was negative except those addressed in the HPI,     History   Social History  . Marital Status: Single    Spouse Name: N/A    Number of Children: 0  . Years of Education: N/A   Occupational History  . project Surveyor, quantity   Social History Main Topics  . Smoking status: Never Smoker   . Smokeless tobacco: Never Used  . Alcohol Use:  2.5 oz/week    5 drink(s) per week     Comment: occasional  . Drug Use: No  . Sexual Activity: Not on file   Other Topics Concern  . Not on file   Social History Narrative  . No narrative on file    Objective:  Filed Vitals:   10/21/13 1408  BP: 124/72  Pulse: 76  Temp: 98.1 F (36.7 C)  Resp: 12     General appearance: alert, cooperative and appears stated age Ears: normal TM's and external ear canals both ears Throat: lips, mucosa, and tongue normal; teeth and gums normal Neck: no adenopathy, no carotid bruit, supple, symmetrical, trachea midline and thyroid not enlarged, symmetric, no tenderness/mass/nodules Back: symmetric, no curvature. ROM normal. No CVA tenderness. Lungs: clear to auscultation bilaterally Heart: regular rate and rhythm, S1, S2 normal, no murmur, click, rub or gallop Abdomen: soft, non-tender; bowel sounds normal; no masses,  no organomegaly Pulses: 2+ and symmetric Skin: Skin color, texture, turgor normal. No rashes or lesions Lymph nodes: Cervical, supraclavicular, and axillary nodes normal.  Assessment and Plan:  Type II or unspecified type diabetes mellitus without mention of complication, not stated as uncontrolled Well-controlled on diet alone .  hemoglobin A1c  has been consistently less than 7.0 . Patient is up-to-date on eye exams and foot exam was done today.  There is  no proteinuria on today's micro urinalysis .  Fasting lipids and A1c  have been ordered and therapy will be advised if indicated according to new ACC guidelines based on patient's 10 year risk of CAD.  I have addressed  BMI and recommended wt loss of 10% of body weigh over the next 6 months using a low glycemic index diet and regular exercise a minimum of 5 days per week.     Severe obesity (BMI >= 40) I have addressed  BMI and recommended wt loss of 10% of body weigh over the next 6 months using a low glycemic index diet and regular exercise a minimum of 5 days per week.  Since she is not exercising or follow a low GI diet, appetite suppressants have not been discussed   High blood pressure Well controlled on current regimen. Renal function has been stable, no changes until labs can be reviewed.    Updated Medication List Outpatient Encounter Prescriptions as of 10/21/2013  Medication Sig  . albuterol (PROVENTIL HFA) 108 (90 BASE) MCG/ACT inhaler Inhale 1 puff into the lungs 4 (four) times daily.  Marland Kitchen amLODipine (NORVASC) 5 MG tablet Take one-half tablet by  mouth daily  . cetirizine (ZYRTEC) 10 MG tablet Take 10 mg by mouth as needed.    . hydrochlorothiazide (MICROZIDE) 12.5 MG capsule Take 1 capsule (12.5 mg total) by mouth every morning.  . mometasone (NASONEX) 50 MCG/ACT nasal spray Place 2 sprays into the nose daily.    . norethindrone-ethinyl estradiol (GILDESS 1/20) 1-20 MG-MCG tablet Take 1 tablet by mouth daily.  . Olopatadine HCl (PATANASE) 0.6 % SOLN Place into the nose.    . [DISCONTINUED] amLODipine (NORVASC) 5 MG tablet Take one-half tablet by  mouth daily  . [DISCONTINUED] hydrochlorothiazide (MICROZIDE) 12.5 MG capsule Take 1 capsule (12.5 mg total) by mouth every morning.  . [DISCONTINUED] ALPRAZolam (XANAX) 0.5 MG tablet Take 1 tablet (0.5 mg total) by mouth as needed for sleep or anxiety.  . [DISCONTINUED] azithromycin (ZITHROMAX) 500 MG tablet Take 1 tablet (500 mg total) by mouth daily.     Orders Placed This Encounter  Procedures  . HM DIABETES EYE EXAM    No Follow-up on file.

## 2013-10-21 NOTE — Progress Notes (Signed)
Pre-visit discussion using our clinic review tool. No additional management support is needed unless otherwise documented below in the visit note.  

## 2013-10-23 ENCOUNTER — Encounter: Payer: Self-pay | Admitting: Internal Medicine

## 2013-10-23 NOTE — Assessment & Plan Note (Signed)
Well controlled on current regimen. Renal function has been stable, no changes until labs can be reviewed.

## 2013-10-23 NOTE — Assessment & Plan Note (Signed)
Well-controlled on diet alone .  hemoglobin A1c has been consistently less than 7.0 . Patient is up-to-date on eye exams and foot exam was done today.  There is  no proteinuria on today's micro urinalysis .  Fasting lipids and A1c  have been ordered and therapy will be advised if indicated according to new ACC guidelines based on patient's 10 year risk of CAD.  I have addressed  BMI and recommended wt loss of 10% of body weigh over the next 6 months using a low glycemic index diet and regular exercise a minimum of 5 days per week.

## 2013-10-23 NOTE — Assessment & Plan Note (Signed)
I have addressed  BMI and recommended wt loss of 10% of body weigh over the next 6 months using a low glycemic index diet and regular exercise a minimum of 5 days per week. Since she is not exercising or follow a low GI diet, appetite suppressants have not been discussed

## 2013-10-25 MED ORDER — PREDNISONE (PAK) 10 MG PO TABS: ORAL_TABLET | ORAL | Status: DC

## 2013-10-25 MED ORDER — BENZONATATE 200 MG PO CAPS: 200.0000 mg | ORAL_CAPSULE | Freq: Three times a day (TID) | ORAL | Status: DC | PRN

## 2013-12-09 ENCOUNTER — Other Ambulatory Visit: Payer: Self-pay | Admitting: Internal Medicine

## 2013-12-10 LAB — COMPREHENSIVE METABOLIC PANEL
ALBUMIN: 4 g/dL (ref 3.5–5.5)
ALT: 11 IU/L (ref 0–32)
AST: 12 IU/L (ref 0–40)
Albumin/Globulin Ratio: 1.4 (ref 1.1–2.5)
Alkaline Phosphatase: 43 IU/L (ref 39–117)
BUN/Creatinine Ratio: 10 (ref 8–20)
BUN: 8 mg/dL (ref 6–20)
CO2: 25 mmol/L (ref 18–29)
Calcium: 9.4 mg/dL (ref 8.7–10.2)
Chloride: 100 mmol/L (ref 97–108)
Creatinine, Ser: 0.79 mg/dL (ref 0.57–1.00)
GFR calc non Af Amer: 97 mL/min/{1.73_m2} (ref >59)
GFR, EST AFRICAN AMERICAN: 111 mL/min/{1.73_m2} (ref >59)
Globulin, Total: 2.8 g/dL (ref 1.5–4.5)
Glucose: 116 mg/dL — ABNORMAL HIGH (ref 65–99)
Potassium: 5.3 mmol/L — ABNORMAL HIGH (ref 3.5–5.2)
Sodium: 139 mmol/L (ref 134–144)
TOTAL PROTEIN: 6.8 g/dL (ref 6.0–8.5)

## 2013-12-10 LAB — TSH: TSH: 1.01 u[IU]/mL (ref 0.450–4.500)

## 2013-12-10 LAB — LIPID PANEL W/O CHOL/HDL RATIO
Cholesterol, Total: 164 mg/dL (ref 100–199)
HDL: 59 mg/dL (ref >39)
LDL CALC: 88 mg/dL (ref 0–99)
TRIGLYCERIDES: 85 mg/dL (ref 0–149)
VLDL CHOLESTEROL CAL: 17 mg/dL (ref 5–40)

## 2013-12-10 LAB — HGB A1C W/O EAG: Hgb A1c MFr Bld: 6.3 % — ABNORMAL HIGH (ref 4.8–5.6)

## 2013-12-11 ENCOUNTER — Encounter: Payer: Self-pay | Admitting: Internal Medicine

## 2014-01-27 ENCOUNTER — Ambulatory Visit (INDEPENDENT_AMBULATORY_CARE_PROVIDER_SITE_OTHER): Payer: 59 | Admitting: Internal Medicine

## 2014-01-27 ENCOUNTER — Other Ambulatory Visit: Payer: Self-pay | Admitting: Internal Medicine

## 2014-01-27 ENCOUNTER — Encounter: Payer: Self-pay | Admitting: Internal Medicine

## 2014-01-27 VITALS — BP 118/88 | HR 79 | Temp 97.9°F | Resp 16 | Wt 230.5 lb

## 2014-01-27 DIAGNOSIS — E875 Hyperkalemia: Secondary | ICD-10-CM

## 2014-01-27 DIAGNOSIS — E119 Type 2 diabetes mellitus without complications: Secondary | ICD-10-CM

## 2014-01-27 DIAGNOSIS — I10 Essential (primary) hypertension: Secondary | ICD-10-CM

## 2014-01-27 DIAGNOSIS — D649 Anemia, unspecified: Secondary | ICD-10-CM

## 2014-01-27 MED ORDER — CYANOCOBALAMIN 1000 MCG SL SUBL: 1.0000 | SUBLINGUAL_TABLET | Freq: Every day | SUBLINGUAL | Status: DC

## 2014-01-27 MED ORDER — PHENTERMINE HCL 37.5 MG PO TABS: 37.5000 mg | ORAL_TABLET | Freq: Every day | ORAL | Status: DC

## 2014-01-27 NOTE — Patient Instructions (Signed)
I am prescribing phentermine to help you lose 23 lbs by June (or August if you must dealy...) Take 1/2 tablet daily in the morning  Return before the ed of the month for vital signs.  It will not be refilled until you do this. Make sure you have developed a consistent exercise program 4 to 5 days per week/30 minutes   This is my version of a  "Low GI"  Diet:  It will still lower your blood sugars and allow you to lose 4 to 8  lbs  per month if you follow it carefully.  Your goal with exercise is a minimum of 30 minutes of aerobic exercise 5 days per week (Walking does not count once it becomes easy!)    All of the foods can be found at grocery stores and in bulk at [employer].  The Atkins protein bars and shakes are available in more varieties at [employer], [employer] and [place].     7 AM Breakfast:  Choose from the following:  Low carbohydrate Protein  Shakes (I recommend the EAS AdvantEdge "Carb Control" shakes  Or the low carb shakes by Atkins.    2.5 carbs   Arnold's "Sandwhich Thin"toasted  w/ peanut butter (no jelly: about 20 net carbs  "Bagel Thin" with cream cheese and salmon: about 20 carbs   a scrambled egg/bacon/cheese burrito made with Mission's "carb balance" whole wheat tortilla  (about 10 net carbs )   Avoid cereal and bananas, oatmeal and cream of wheat and grits. They are loaded with carbohydrates!   10 AM: high protein snack  Protein bar by Atkins (the snack size, under 200 cal, usually < 6 net carbs).    A stick of cheese:  Around 1 carb,  100 cal     Dannon Light n Fit [place] Yogurt  (80 cal, 8 carbs)  Other so called "protein bars" and Greek yogurts tend to be loaded with carbohydrates.  Remember, in food advertising, the word "energy" is synonymous for " carbohydrate."  Lunch:   A Sandwich using the bread choices listed, Can use any  Eggs,  lunchmeat, grilled meat or canned tuna), avocado, regular mayo/mustard  and cheese.  A Salad using blue cheese, ranch,  Goddess  or vinagrette,  No croutons or "confetti" and no "candied nuts" but regular nuts OK.   No pretzels or chips.  Pickles and miniature sweet peppers are a good low carb alternative that provide a "crunch"  The bread is the only source of carbohydrate in a sandwich and  can be decreased by trying some of these alternatives to traditional loaf bread  Joseph's makes a pita bread and a flat bread that are 50 cal and 4 net carbs available at [employer] and [place].  This can be toasted to use with hummous as well  Toufayan makes a low carb flatbread that's 100 cal and 9 net carbs available at [employer] and [employer]J's makes 2 sizes of  Low carb whole wheat tortilla  (The large one is 210 cal and 6 net carbs) Avoid "Low fat dressings, as well as Barry Brunner and [place] dressings They are loaded with sugar!   3 PM/ Mid day  Snack:  Consider  1 ounce of  almonds, walnuts, pistachios, pecans, peanuts,  Macadamia nuts or a nut medley.  Avoid "granola"; the dried cranberries and raisins are loaded with carbohydrates. Mixed nuts as long as there are no raisins,  cranberries or dried fruit.  6 PM  Dinner:     Meat/fowl/fish with a green salad, and either broccoli, cauliflower, green beans, spinach, brussel sprouts or  Lima beans. DO NOT BREAD THE PROTEIN!!      There is a low carb pasta by Dreamfield's that is acceptable and tastes great: only 5 digestible carbs/serving.( All grocery stores but BJs carry it )  Try Hurley Cisco Angelo's chicken piccata or chicken or eggplant parm over low carb pasta.(Lowes and BJs)   Marjory Lies Sanchez's "Carnitas" (pulled pork, no sauce,  0 carbs) or his beef pot roast to make a dinner burrito (at [employer]J's)  Pesto over low carb pasta (bj's sells a good quality pesto in the center refrigerated section of the deli   Whole wheat pasta is still full of digestible carbs and  Not as low in glycemic index as Dreamfield's.   Brown rice is still rice,  So skip the rice and noodles if you eat  [place] or [place] (or at least limit to 1/2 cup)  9 PM snack :   Breyer's "low carb" fudgsicle or  ice cream bar (Carb Smart line), or  Weight Watcher's ice cream bar , or another "no sugar added" ice cream;  a serving of fresh berries/cherries with whipped cream   Cheese or DANNON'S LlGHT N FIT [place] YOGURT  Avoid bananas, pineapple, grapes  and watermelon on a regular basis because they are high in sugar.  THINK OF THEM AS DESSERT  Remember that snack Substitutions should be less than 10 NET carbs per serving and meals < 20 carbs. Remember to subtract fiber grams to get the "net carbs."

## 2014-01-27 NOTE — Progress Notes (Signed)
Patient ID: Katelyn Wilson, female   DOB: [birth year], 38 y.o.   MRN: [id number]  Patient Active Problem List   Diagnosis Date Noted  . Severe obesity (BMI >= 40) 10/23/2013  . Fibroid uterus 06/22/2013  . Contraceptive management 11/16/2011  . Type II or unspecified type diabetes mellitus without mention of complication, not stated as uncontrolled 11/16/2011  . Anxiety   . High blood pressure 06/18/2011  . Family history of hypertrophic cardiomyopathy 06/18/2011    Subjective:  CC:   Chief Complaint  Patient presents with  . Follow-up  . Diabetes    HPI:   Katelyn Wilson is a 38 y.o. female who presents for Follow up on diet controlled diabetes mellitus, hypertension and obesity.    DM:  Her last diabetic eye exam was in July by Dr. Gloriann Loan. She has had prior Lasik procedure.  She has been trying to follow a low GI diet and walking several times a week but has not lost weight,  She has received an ultimatum  For [facility] that she will receive an obesity tax if she has not lost 10% of bodyweight by June .  Her schedule has also become complicated bc she is taking  a night time class for work.      Past Medical History  Diagnosis Date  . Diabetes mellitus   . Obesity   . Obesity (BMI 30-39.9)   . Anxiety   . Hypertension   . allergic rhinitis     Past Surgical History  Procedure Laterality Date  . None         The following portions of the patient's history were reviewed and updated as appropriate: Allergies, current medications, and problem list.    Review of Systems:   Patient denies headache, fevers, malaise, unintentional weight loss, skin rash, eye pain, sinus congestion and sinus pain, sore throat, dysphagia,  hemoptysis , cough, dyspnea, wheezing, chest pain, palpitations, orthopnea, edema, abdominal pain, nausea, melena, diarrhea, constipation, flank pain, dysuria, hematuria, urinary  Frequency, nocturia, numbness, tingling, seizures,  Focal weakness, Loss of  consciousness,  Tremor, insomnia, depression, anxiety, and suicidal ideation.     History   Social History  . Marital Status: Single    Spouse Name: N/A    Number of Children: 0  . Years of Education: N/A   Occupational History  . project Environmental education officer   Social History Main Topics  . Smoking status: Never Smoker   . Smokeless tobacco: Never Used  . Alcohol Use: 2.5 oz/week    5 drink(s) per week     Comment: occasional  . Drug Use: No  . Sexual Activity: Yes   Other Topics Concern  . Not on file   Social History Narrative  . No narrative on file    Objective:  Filed Vitals:   01/27/14 1545  BP: 118/88  Pulse: 79  Temp: 97.9 F (36.6 C)  Resp: 16     General appearance: alert, cooperative and appears stated age Ears: normal TM's and external ear canals both ears Throat: lips, mucosa, and tongue normal; teeth and gums normal Neck: no adenopathy, no carotid bruit, supple, symmetrical, trachea midline and thyroid not enlarged, symmetric, no tenderness/mass/nodules Back: symmetric, no curvature. ROM normal. No CVA tenderness. Lungs: clear to auscultation bilaterally Heart: regular rate and rhythm, S1, S2 normal, no murmur, click, rub or gallop Abdomen: soft, non-tender; bowel sounds normal; no masses,  no organomegaly Pulses: 2+ and symmetric Skin: Skin color, texture, turgor  normal. No rashes or lesions Lymph nodes: Cervical, supraclavicular, and axillary nodes normal.  Assessment and Plan:  Severe obesity (BMI >= 40) I have addressed  BMI and recommended wt loss of 10% of body weigh over the next 6 months using a low glycemic index diet and regular exercise a minimum of 5 days per week. Trial of phentermine requested as she has used it in the past [facility]   Type II or unspecified type diabetes mellitus without mention of complication, not stated as uncontrolled Well-controlled on diet alone .  hemoglobin A1c has been  consistently less than 7.0 . Patient is up-to-date on eye exams and foot exam was done today.  There is  no proteinuria on today's micro urinalysis .  Fasting lipids and A1c  have been ordered and therapy will be advised if indicated according to new ACC guidelines based on patient's 10 year risk of CAD.  I have addressed  BMI and recommended wt loss of 10% of body weight over the next 6 months using a low glycemic index diet and regular exercise a minimum of 5 days per week.       High blood pressure Well controlled on current regimen. Renal function stable, no changes today.  Cautioned about the potential effect of phentermine on bp .   She will return in 2 or 3 weeksfor vital sign check .    Updated Medication List Outpatient Encounter Prescriptions as of 01/27/2014  Medication Sig  . albuterol (PROVENTIL HFA) 108 (90 BASE) MCG/ACT inhaler Inhale 1 puff into the lungs 4 (four) times daily.  Marland Kitchen amLODipine (NORVASC) 5 MG tablet Take one-half tablet by  mouth daily  . cetirizine (ZYRTEC) 10 MG tablet Take 10 mg by mouth as needed.    . hydrochlorothiazide (MICROZIDE) 12.5 MG capsule Take 1 capsule (12.5 mg total) by mouth every morning.  . mometasone (NASONEX) 50 MCG/ACT nasal spray Place 2 sprays into the nose daily.    . norethindrone-ethinyl estradiol (GILDESS 1/20) 1-20 MG-MCG tablet Take 1 tablet by mouth daily.  . Olopatadine HCl (PATANASE) 0.6 % SOLN Place into the nose.    . benzonatate (TESSALON) 200 MG capsule Take 1 capsule (200 mg total) by mouth 3 (three) times daily as needed for cough.  . phentermine (ADIPEX-P) 37.5 MG tablet Take 1 tablet (37.5 mg total) by mouth daily before breakfast.  . predniSONE (STERAPRED UNI-PAK) 10 MG tablet 6 tablets on Day 1 , then reduce by 1 tablet daily until gone  . [DISCONTINUED] Cyanocobalamin 1000 MCG SUBL Place 1 tablet (1,000 mcg total) under the tongue daily.     Orders Placed This Encounter  Procedures  . Vitamin B12  . Basic metabolic  panel  . Vitamin B12  . Basic metabolic panel    No Follow-up on file.

## 2014-01-29 ENCOUNTER — Encounter: Payer: Self-pay | Admitting: Internal Medicine

## 2014-01-29 NOTE — Assessment & Plan Note (Signed)
Well-controlled on diet alone .  hemoglobin A1c has been consistently less than 7.0 . Patient is up-to-date on eye exams and foot exam was done today.  There is  no proteinuria on today's micro urinalysis .  Fasting lipids and A1c  have been ordered and therapy will be advised if indicated according to new ACC guidelines based on patient's 10 year risk of CAD.  I have addressed  BMI and recommended wt loss of 10% of body weight over the next 6 months using a low glycemic index diet and regular exercise a minimum of 5 days per week.

## 2014-01-29 NOTE — Assessment & Plan Note (Signed)
Well controlled on current regimen. Renal function stable, no changes today.  Cautioned about the potential effect of phentermine on bp .   She will return in 2 or 3 weeksfor vital sign check .

## 2014-01-29 NOTE — Assessment & Plan Note (Signed)
I have addressed  BMI and recommended wt loss of 10% of body weigh over the next 6 months using a low glycemic index diet and regular exercise a minimum of 5 days per week. Trial of phentermine requested as she has used it in the past when she was treated at the [facility]

## 2014-01-30 ENCOUNTER — Telehealth: Payer: Self-pay | Admitting: Internal Medicine

## 2014-01-30 NOTE — Telephone Encounter (Signed)
Relevant patient education assigned to patient using Emmi. ° °

## 2014-02-22 ENCOUNTER — Telehealth: Payer: Self-pay | Admitting: *Deleted

## 2014-02-22 ENCOUNTER — Ambulatory Visit (INDEPENDENT_AMBULATORY_CARE_PROVIDER_SITE_OTHER): Payer: 59 | Admitting: *Deleted

## 2014-02-22 VITALS — BP 118/88 | HR 102 | Resp 16

## 2014-02-22 DIAGNOSIS — I10 Essential (primary) hypertension: Secondary | ICD-10-CM

## 2014-02-22 NOTE — Telephone Encounter (Signed)
Pt presents for BP/pulse check. Has been taking Phentermine 0.5 tab daily. Reports a 10 lb weight loss so far. No complaints. Pulse 102. BP 118/88. Dr. Tullo notified 

## 2014-02-22 NOTE — Progress Notes (Signed)
Pt presents for BP/pulse check. Has been taking Phentermine 0.5 tab daily. Reports a 10 lb weight loss so far. No complaints. Pulse 102. BP 118/88. Dr. Derrel Nip notified

## 2014-02-22 NOTE — Telephone Encounter (Signed)
The wt los is great . The pulse is a little elevated from the medication but if she is not feeling jittery or short of breath she can continue it

## 2014-02-22 NOTE — Telephone Encounter (Signed)
Sent mychart message

## 2014-02-27 ENCOUNTER — Encounter: Payer: Self-pay | Admitting: Internal Medicine

## 2014-03-08 ENCOUNTER — Encounter: Payer: Self-pay | Admitting: Internal Medicine

## 2014-03-08 ENCOUNTER — Ambulatory Visit (INDEPENDENT_AMBULATORY_CARE_PROVIDER_SITE_OTHER): Payer: 59 | Admitting: Internal Medicine

## 2014-03-08 VITALS — BP 128/98 | HR 89 | Temp 98.1°F | Wt 229.0 lb

## 2014-03-08 DIAGNOSIS — L02219 Cutaneous abscess of trunk, unspecified: Secondary | ICD-10-CM

## 2014-03-08 DIAGNOSIS — L03319 Cellulitis of trunk, unspecified: Principal | ICD-10-CM

## 2014-03-08 MED ORDER — DOXYCYCLINE HYCLATE 100 MG PO TABS: 100.0000 mg | ORAL_TABLET | Freq: Two times a day (BID) | ORAL | Status: DC

## 2014-03-08 MED ORDER — OLOPATADINE HCL 0.6 % NA SOLN: NASAL | Status: DC

## 2014-03-08 MED ORDER — ALBUTEROL SULFATE HFA 108 (90 BASE) MCG/ACT IN AERS: 1.0000 | INHALATION_SPRAY | Freq: Four times a day (QID) | RESPIRATORY_TRACT | Status: DC

## 2014-03-08 MED ORDER — MOMETASONE FUROATE 50 MCG/ACT NA SUSP: 2.0000 | Freq: Every day | NASAL | Status: DC

## 2014-03-08 MED ORDER — GENTAMICIN SULFATE 0.1 % EX OINT: 1.0000 "application " | TOPICAL_OINTMENT | Freq: Three times a day (TID) | CUTANEOUS | Status: DC

## 2014-03-08 NOTE — Progress Notes (Signed)
Pre visit review using our clinic review tool, if applicable. No additional management support is needed unless otherwise documented below in the visit note. 

## 2014-03-08 NOTE — Progress Notes (Signed)
   Subjective:    Patient ID: Katelyn Wilson, female    DOB: [birth year], 38 y.o.   MRN: [id number]  HPI 38YO female presents for acute visit.  Spot on mid back for years. Occasionally drains "chunky" white fluid. Over the last few days has been tender and red. Draining purulent fluid for several days. No fever, chills.  Review of Systems  Constitutional: Negative for fever, chills and diaphoresis.  Respiratory: Negative for shortness of breath.   Cardiovascular: Negative for chest pain.  Skin: Positive for color change and wound.       Objective:    BP 128/98  Pulse 89  Temp(Src) 98.1 F (36.7 C) (Oral)  Wt 229 lb (103.874 kg)  SpO2 98%  LMP 02/01/2014 Physical Exam  Constitutional: She is oriented to person, place, and time. She appears well-developed and well-nourished. No distress.  HENT:  Head: Normocephalic and atraumatic.  Right Ear: External ear normal.  Left Ear: External ear normal.  Nose: Nose normal.  Mouth/Throat: Oropharynx is clear and moist.  Eyes: Conjunctivae are normal. Pupils are equal, round, and reactive to light. Right eye exhibits no discharge. Left eye exhibits no discharge. No scleral icterus.  Neck: Normal range of motion. Neck supple. No tracheal deviation present. No thyromegaly present.  Pulmonary/Chest: Effort normal. No accessory muscle usage. Not tachypneic. She has no decreased breath sounds. She has no rhonchi.  Musculoskeletal: Normal range of motion. She exhibits no edema and no tenderness.  Lymphadenopathy:    She has no cervical adenopathy.  Neurological: She is alert and oriented to person, place, and time. No cranial nerve deficit. She exhibits normal muscle tone. Coordination normal.  Skin: Skin is warm and dry. Lesion noted. No rash noted. She is not diaphoretic. There is erythema. No pallor.     Psychiatric: She has a normal mood and affect. Her behavior is normal. Judgment and thought content normal.          Assessment &  Plan:   Problem List Items Addressed This Visit   Cellulitis and abscess of trunk - Primary     Symptoms and exam are most consistent with infected sebaceous gland. Will start doxycycline 100 mg twice daily. Will set up dermatology evaluation for cyst removal. Patient will call if any worsening pain, redness, fever or chills.    Relevant Medications      DOXYCYCLINE HYCLATE 100 MG PO TABS      gentamicin (GARAMYCIN) ointment 0.1%   Other Relevant Orders      Ambulatory referral to Dermatology       Return if symptoms worsen or fail to improve.

## 2014-03-08 NOTE — Assessment & Plan Note (Signed)
Symptoms and exam are most consistent with infected sebaceous gland. Will start doxycycline 100 mg twice daily. Will set up dermatology evaluation for cyst removal. Patient will call if any worsening pain, redness, fever or chills.

## 2014-04-12 ENCOUNTER — Encounter: Payer: Self-pay | Admitting: Internal Medicine

## 2014-04-12 NOTE — Telephone Encounter (Signed)
See my chart message

## 2014-04-13 MED ORDER — ALPRAZOLAM 0.5 MG PO TABS: 0.5000 mg | ORAL_TABLET | ORAL | Status: DC | PRN

## 2014-07-05 ENCOUNTER — Encounter: Payer: Self-pay | Admitting: *Deleted

## 2014-07-05 DIAGNOSIS — E119 Type 2 diabetes mellitus without complications: Secondary | ICD-10-CM

## 2014-07-05 NOTE — Progress Notes (Signed)
Chart reviewed for DM bundle. Pt due for appt and fasting labs. Sent mychart message on need for appt. Orders placed

## 2014-07-06 NOTE — Telephone Encounter (Signed)
Pt called states she received an email from you.  She has scheduled her appointment, however she requests that her labs be sent to [facility].  Please advise

## 2014-07-10 LAB — HM DIABETES EYE EXAM

## 2014-07-10 NOTE — Telephone Encounter (Signed)
Called pt and notified lab order ready for pickup

## 2014-07-23 ENCOUNTER — Other Ambulatory Visit: Payer: Self-pay | Admitting: Internal Medicine

## 2014-07-24 NOTE — Telephone Encounter (Signed)
Appt sch 08/07/14

## 2014-07-28 ENCOUNTER — Other Ambulatory Visit: Payer: Self-pay | Admitting: Internal Medicine

## 2014-07-29 LAB — MICROALBUMIN / CREATININE URINE RATIO
Creatinine, Ur: 134.2 mg/dL (ref 16.0–327.0)
MICROALB/CREAT RATIO: 4.5 mg/g creat (ref 0.0–30.0)
MICROALBUM., U, RANDOM: 6 ug/mL (ref 0.0–17.0)

## 2014-07-29 LAB — LIPID PANEL W/O CHOL/HDL RATIO
Cholesterol, Total: 193 mg/dL (ref 100–199)
HDL: 58 mg/dL (ref >39)
LDL Calculated: 114 mg/dL — ABNORMAL HIGH (ref 0–99)
Triglycerides: 105 mg/dL (ref 0–149)
VLDL Cholesterol Cal: 21 mg/dL (ref 5–40)

## 2014-08-02 LAB — HEMOGLOBIN A1C: Hgb A1c MFr Bld: 6.2 % — AB (ref 4.0–6.0)

## 2014-08-04 ENCOUNTER — Telehealth: Payer: Self-pay | Admitting: Internal Medicine

## 2014-08-04 NOTE — Telephone Encounter (Signed)
Sent my chart with results. 

## 2014-08-04 NOTE — Telephone Encounter (Signed)
a1c is 6.2  Excellent  No med changes needed

## 2014-08-07 ENCOUNTER — Ambulatory Visit (INDEPENDENT_AMBULATORY_CARE_PROVIDER_SITE_OTHER): Payer: 59 | Admitting: Internal Medicine

## 2014-08-07 ENCOUNTER — Encounter: Payer: Self-pay | Admitting: Internal Medicine

## 2014-08-07 DIAGNOSIS — N921 Excessive and frequent menstruation with irregular cycle: Secondary | ICD-10-CM | POA: Insufficient documentation

## 2014-08-07 DIAGNOSIS — E119 Type 2 diabetes mellitus without complications: Secondary | ICD-10-CM

## 2014-08-07 DIAGNOSIS — N920 Excessive and frequent menstruation with regular cycle: Secondary | ICD-10-CM

## 2014-08-07 DIAGNOSIS — I10 Essential (primary) hypertension: Secondary | ICD-10-CM

## 2014-08-07 LAB — HM DIABETES FOOT EXAM: HM DIABETIC FOOT EXAM: NORMAL

## 2014-08-07 MED ORDER — PHENTERMINE HCL 37.5 MG PO TABS: 37.5000 mg | ORAL_TABLET | Freq: Every day | ORAL | Status: DC

## 2014-08-07 NOTE — Progress Notes (Signed)
Pre-visit discussion using our clinic review tool. No additional management support is needed unless otherwise documented below in the visit note.  

## 2014-08-07 NOTE — Patient Instructions (Signed)
Your diabetes remains under excellent control currently. .Please return in 3 months for follow up on diabetes and make sure you are seeing your eye doctor at least once a year.   I have authorized the use of phentermine for 3 months.  Please have your vital signs checked a week after starting, and return to see me in 3 months.t  Take 1/2 tablet in the morning,    Goal wt is 12 lbs off by next office visit

## 2014-08-07 NOTE — Assessment & Plan Note (Addendum)
She has had difficulty losing weight due to increased appetite and is requesting a trial of  Phentermine.  She is aware of the possible side effects and risks and understands that    The medication will be discontinued if she has not lost 5% of her body weight over the next 3 months, which , based on today's weight is 12 lbs Her eventual goal is 175 lbs for bmi 30.

## 2014-08-09 NOTE — Progress Notes (Signed)
Patient ID: Katelyn Wilson, female   DOB: Jul 20, 1976, 38 y.o.   MRN: [id number]   Patient Active Problem List   Diagnosis Date Noted  . Menometrorrhagia 08/07/2014  . Cellulitis and abscess of trunk 03/08/2014  . Severe obesity (BMI >= 40) 10/23/2013  . Fibroid uterus 06/22/2013  . Contraceptive management 11/16/2011  . Type II or unspecified type diabetes mellitus without mention of complication, not stated as uncontrolled 11/16/2011  . Anxiety   . High blood pressure 06/18/2011  . Family history of hypertrophic cardiomyopathy 06/18/2011    Subjective:  CC:   Chief Complaint  Patient presents with  . Follow-up  . Diabetes    HPI:   Katelyn Wilson is a 38 y.o. female who presents for Follow up on chronic conditions including type 2 DM with obesity, hypertension, and menometrorrhagia.  She has gained 5 more lbs since April.  She is not exercising regularly but has made plans to start a walking program with several people at work.  She avoiding excessive starches  But has been unable to lose weight due to increased appetite. .    Past Medical History  Diagnosis Date  . Diabetes mellitus   . Obesity   . Obesity (BMI 30-39.9)   . Anxiety   . Hypertension   . allergic rhinitis     Past Surgical History  Procedure Laterality Date  . None         The following portions of the patient's history were reviewed and updated as appropriate: Allergies, current medications, and problem list.    Review of Systems:   Patient denies headache, fevers, malaise, unintentional weight loss, skin rash, eye pain, sinus congestion and sinus pain, sore throat, dysphagia,  hemoptysis , cough, dyspnea, wheezing, chest pain, palpitations, orthopnea, edema, abdominal pain, nausea, melena, diarrhea, constipation, flank pain, dysuria, hematuria, urinary  Frequency, nocturia, numbness, tingling, seizures,  Focal weakness, Loss of consciousness,  Tremor, insomnia, depression, anxiety, and suicidal  ideation.     History   Social History  . Marital Status: Single    Spouse Name: N/A    Number of Children: 0  . Years of Education: N/A   Occupational History  . project Environmental education officer   Social History Main Topics  . Smoking status: Never Smoker   . Smokeless tobacco: Never Used  . Alcohol Use: 2.5 oz/week    5 drink(s) per week     Comment: occasional  . Drug Use: No  . Sexual Activity: Yes   Other Topics Concern  . Not on file   Social History Narrative  . No narrative on file    Objective:  Filed Vitals:   08/07/14 1600  BP: 118/78  Pulse: 78  Temp: 98.1 F (36.7 C)  Resp: 16     General appearance: alert, cooperative and appears stated age Ears: normal TM's and external ear canals both ears Throat: lips, mucosa, and tongue normal; teeth and gums normal Neck: no adenopathy, no carotid bruit, supple, symmetrical, trachea midline and thyroid not enlarged, symmetric, no tenderness/mass/nodules Back: symmetric, no curvature. ROM normal. No CVA tenderness. Lungs: clear to auscultation bilaterally Heart: regular rate and rhythm, S1, S2 normal, no murmur, click, rub or gallop Abdomen: soft, non-tender; bowel sounds normal; no masses,  no organomegaly Pulses: 2+ and symmetric Skin: Skin color, texture, turgor normal. No rashes or lesions Lymph nodes: Cervical, supraclavicular, and axillary nodes normal.  Assessment and Plan:  Severe obesity (BMI >= 40) She has had difficulty  losing weight due to increased appetite and is requesting a trial of  Phentermine.  She is aware of the possible side effects and risks and understands that    The medication will be discontinued if she has not lost 5% of her body weight over the next 3 months, which , based on today's weight is 12 lbs Her eventual goal is 175 lbs for bmi 30.   Type II or unspecified type diabetes mellitus without mention of complication, not stated as uncontrolled Well-controlled on diet alone .   hemoglobin A1c has been consistently less than 7.0 . Patient is up-to-date on eye exams and foot exam was done today.  There is  no proteinuria on today's micro urinalysis .  Fasting lipids and A1c  have been ordered and therapy will be advised if indicated according to new ACC guidelines based on patient's 10 year risk of CAD.  I have addressed  BMI and recommended wt loss of 10% of body weight over the next 6 months using a low glycemic index diet and regular exercise a minimum of 5 days per week.    Lab Results  Component Value Date   HGBA1C 6.2* 08/02/2014   Lab Results  Component Value Date   HDL 58 07/28/2014   LDLCALC 114* 07/28/2014   TRIG 105 07/28/2014   CHOLHDL 3.1 11/20/2011   No results found for this basename: MICROALBUR, [id number]         Menometrorrhagia Thyroid function is normal.  May be due to PCOS.  Has GYN appt in 2 weeks    Updated Medication List Outpatient Encounter Prescriptions as of 08/07/2014  Medication Sig  . albuterol (PROVENTIL HFA) 108 (90 BASE) MCG/ACT inhaler Inhale 1 puff into the lungs 4 (four) times daily.  Marland Kitchen amLODipine (NORVASC) 5 MG tablet Take one-half tablet by  mouth daily  . cetirizine (ZYRTEC) 10 MG tablet Take 10 mg by mouth as needed.    . hydrochlorothiazide (MICROZIDE) 12.5 MG capsule Take 1 capsule by mouth  every morning  . mometasone (NASONEX) 50 MCG/ACT nasal spray Place 2 sprays into the nose daily.  . norethindrone-ethinyl estradiol (GILDESS 1/20) 1-20 MG-MCG tablet Take 1 tablet by mouth daily.  . Olopatadine HCl (PATANASE) 0.6 % SOLN Use as directed  . ALPRAZolam (XANAX) 0.5 MG tablet Take 1 tablet (0.5 mg total) by mouth as needed for sleep or anxiety.  . phentermine (ADIPEX-P) 37.5 MG tablet Take 1 tablet (37.5 mg total) by mouth daily before breakfast.  . [DISCONTINUED] benzonatate (TESSALON) 200 MG capsule Take 1 capsule (200 mg total) by mouth 3 (three) times daily as needed for cough.  . [DISCONTINUED] doxycycline  (VIBRA-TABS) 100 MG tablet Take 1 tablet (100 mg total) by mouth 2 (two) times daily.  . [DISCONTINUED] gentamicin ointment (GARAMYCIN) 0.1 % Apply 1 application topically 3 (three) times daily.  . [DISCONTINUED] phentermine (ADIPEX-P) 37.5 MG tablet Take 1 tablet (37.5 mg total) by mouth daily before breakfast.

## 2014-08-09 NOTE — Assessment & Plan Note (Addendum)
Well-controlled on diet alone .  hemoglobin A1c has been consistently less than 7.0 . Patient is up-to-date on eye exams and foot exam was done today.  There is  no proteinuria on today's micro urinalysis .  Fasting lipids and A1c  have been ordered and therapy will be advised if indicated according to new ACC guidelines based on patient's 10 year risk of CAD.  I have addressed  BMI and recommended wt loss of 10% of body weight over the next 6 months using a low glycemic index diet and regular exercise a minimum of 5 days per week.    Lab Results  Component Value Date   HGBA1C 6.2* 08/02/2014   Lab Results  Component Value Date   HDL 58 07/28/2014   LDLCALC 114* 07/28/2014   TRIG 105 07/28/2014   CHOLHDL 3.1 11/20/2011   No results found for this basename: MICROALBUR, [id number]

## 2014-08-09 NOTE — Assessment & Plan Note (Signed)
Well controlled on current regimen. Renal function stable, no changes today.  Cautioned about the potential effect of phentermine on bp .   She will return in 2 or 3 weeksfor vital sign check .   Lab Results  Component Value Date   CREATININE 0.79 12/09/2013

## 2014-08-09 NOTE — Assessment & Plan Note (Signed)
Thyroid function is normal.  May be due to PCOS.  Has GYN appt in 2 weeks

## 2014-08-11 LAB — CBC AND DIFFERENTIAL
HCT: 36 % (ref 36–46)
HEMOGLOBIN: 12.3 g/dL (ref 12.0–16.0)
PLATELETS: 366 10*3/uL (ref 150–399)
WBC: 5 10^3/mL

## 2014-08-17 ENCOUNTER — Telehealth: Payer: Self-pay | Admitting: Internal Medicine

## 2014-08-17 NOTE — Telephone Encounter (Signed)
Sent mychart message

## 2014-08-17 NOTE — Telephone Encounter (Signed)
CBC, iron studies ,  T3 are all normal

## 2014-08-30 ENCOUNTER — Encounter: Payer: Self-pay | Admitting: Internal Medicine

## 2014-10-03 ENCOUNTER — Other Ambulatory Visit: Payer: Self-pay | Admitting: Internal Medicine

## 2014-11-06 ENCOUNTER — Ambulatory Visit: Payer: 59 | Admitting: Internal Medicine

## 2014-11-27 ENCOUNTER — Telehealth: Payer: Self-pay | Admitting: Internal Medicine

## 2014-11-27 NOTE — Telephone Encounter (Signed)
left msg to call office to reschedule appt 12/21.msn

## 2014-12-05 ENCOUNTER — Ambulatory Visit: Payer: 59 | Admitting: Internal Medicine

## 2014-12-06 ENCOUNTER — Ambulatory Visit (INDEPENDENT_AMBULATORY_CARE_PROVIDER_SITE_OTHER): Payer: 59 | Admitting: Internal Medicine

## 2014-12-06 ENCOUNTER — Encounter: Payer: Self-pay | Admitting: Internal Medicine

## 2014-12-06 DIAGNOSIS — E119 Type 2 diabetes mellitus without complications: Secondary | ICD-10-CM

## 2014-12-06 DIAGNOSIS — E1169 Type 2 diabetes mellitus with other specified complication: Secondary | ICD-10-CM

## 2014-12-06 DIAGNOSIS — I10 Essential (primary) hypertension: Secondary | ICD-10-CM

## 2014-12-06 DIAGNOSIS — E669 Obesity, unspecified: Secondary | ICD-10-CM

## 2014-12-06 DIAGNOSIS — Z8249 Family history of ischemic heart disease and other diseases of the circulatory system: Secondary | ICD-10-CM

## 2014-12-06 NOTE — Patient Instructions (Addendum)
I want you to lose 12 lbs by your next visit. (3 months:  after  March 30th. )  STOP SKIPPING BREAKFAST!!!!  Try the Premier Protein premixed shakes in chocolate or vanilla   You are due for A1c and other nonfasting labs   You should have a pneumonia vaccine but have deferred this for now   Diabetes and Exercise Exercising regularly is important. It is not just about losing weight. It has many health benefits, such as:  Improving your overall fitness, flexibility, and endurance.  Increasing your bone density.  Helping with weight control.  Decreasing your body fat.  Increasing your muscle strength.  Reducing stress and tension.  Improving your overall health. People with diabetes who exercise gain additional benefits because exercise:  Reduces appetite.  Improves the body's use of blood sugar (glucose).  Helps lower or control blood glucose.  Decreases blood pressure.  Helps control blood lipids (such as cholesterol and triglycerides).  Improves the body's use of the hormone insulin by:  Increasing the body's insulin sensitivity.  Reducing the body's insulin needs.  Decreases the risk for heart disease because exercising:  Lowers cholesterol and triglycerides levels.  Increases the levels of good cholesterol (such as high-density lipoproteins [HDL]) in the body.  Lowers blood glucose levels. YOUR ACTIVITY PLAN  Choose an activity that you enjoy and set realistic goals. Your health care provider or diabetes educator can help you make an activity plan that works for you. Exercise regularly as directed by your health care provider. This includes:  Performing resistance training twice a week such as push-ups, sit-ups, lifting weights, or using resistance bands.  Performing 150 minutes of cardio exercises each week such as walking, running, or playing sports.  Staying active and spending no more than 90 minutes at one time being inactive. Even short bursts of  exercise are good for you. Three 10-minute sessions spread throughout the day are just as beneficial as a single 30-minute session. Some exercise ideas include:  Taking the dog for a walk.  Taking the stairs instead of the elevator.  Dancing to your favorite song.  Doing an exercise video.  Doing your favorite exercise with a friend. RECOMMENDATIONS FOR EXERCISING WITH TYPE 1 OR TYPE 2 DIABETES   Check your blood glucose before exercising. If blood glucose levels are greater than 240 mg/dL, check for urine ketones. Do not exercise if ketones are present.  Avoid injecting insulin into areas of the body that are going to be exercised. For example, avoid injecting insulin into:  The arms when playing tennis.  The legs when jogging.  Keep a record of:  Food intake before and after you exercise.  Expected peak times of insulin action.  Blood glucose levels before and after you exercise.  The type and amount of exercise you have done.  Review your records with your health care provider. Your health care provider will help you to develop guidelines for adjusting food intake and insulin amounts before and after exercising.  If you take insulin or oral hypoglycemic agents, watch for signs and symptoms of hypoglycemia. They include:  Dizziness.  Shaking.  Sweating.  Chills.  Confusion.  Drink plenty of water while you exercise to prevent dehydration or heat stroke. Body water is lost during exercise and must be replaced.  Talk to your health care provider before starting an exercise program to make sure it is safe for you. Remember, almost any type of activity is better than none. Document Released: 02/14/2004 Document  Revised: 04/10/2014 Document Reviewed: 05/03/2013 [facility] Patient Information 2015 [place], [place]. This information is not intended to replace advice given to you by your health care provider. Make sure you discuss any questions you have with your health care  provider.

## 2014-12-06 NOTE — Progress Notes (Signed)
Patient ID: Katelyn Wilson, female   DOB: 1976/01/21, 38 y.o.   MRN: [id number]    Patient Active Problem List   Diagnosis Date Noted  . Menometrorrhagia 08/07/2014  . Cellulitis and abscess of trunk 03/08/2014  . Severe obesity (BMI >= 40) 10/23/2013  . Fibroid uterus 06/22/2013  . Contraceptive management 11/16/2011  . Diabetes mellitus type 2 in obese 11/16/2011  . Anxiety   . High blood pressure 06/18/2011  . Family history of hypertrophic cardiomyopathy 06/18/2011    Subjective:  CC:   Chief Complaint  Patient presents with  . Follow-up  . Diabetes    HPI:   Katelyn Wilson is a 38 y.o. female who presents for follow up.   Patient arrived 65 mintues late for a 15 min appt  to follow up on DM type 2 complicated by obesity and hypertension.  She was last seen in August at which time her obesity was addressed and she was prescribed phentermine and has gained 10 lbs since August.  She has stopped taking the phentermine because she was not following a diet or exercise, .  Has made a new year's resolution  to "do better."  .she checks her blood sugars rarely,  BS has been < 130 fasting when checked,  But not recently.  She has not been following a low GI diet due to the holidays.    Wt Readings from Last 3 Encounters:  12/06/14 244 lb 8 oz (110.904 kg)  08/07/14 234 lb (106.142 kg)  03/08/14 229 lb (103.874 kg)      Past Medical History  Diagnosis Date  . Diabetes mellitus   . Obesity   . Obesity (BMI 30-39.9)   . Anxiety   . Hypertension   . allergic rhinitis     Past Surgical History  Procedure Laterality Date  . None         The following portions of the patient's history were reviewed and updated as appropriate: Allergies, current medications, and problem list.    Review of Systems:   Patient denies headache, fevers, malaise, unintentional weight loss, skin rash, eye pain, sinus congestion and sinus pain, sore throat, dysphagia,  hemoptysis , cough,  dyspnea, wheezing, chest pain, palpitations, orthopnea, edema, abdominal pain, nausea, melena, diarrhea, constipation, flank pain, dysuria, hematuria, urinary  Frequency, nocturia, numbness, tingling, seizures,  Focal weakness, Loss of consciousness,  Tremor, insomnia, depression, anxiety, and suicidal ideation.     History   Social History  . Marital Status: Single    Spouse Name: N/A    Number of Children: 0  . Years of Education: N/A   Occupational History  . project Environmental education officer   Social History Main Topics  . Smoking status: Never Smoker   . Smokeless tobacco: Never Used  . Alcohol Use: 2.5 oz/week    5 drink(s) per week     Comment: occasional  . Drug Use: No  . Sexual Activity: Yes   Other Topics Concern  . Not on file   Social History Narrative    Objective:  Filed Vitals:   12/06/14 1441  BP: 120/88  Pulse: 75  Temp: 98.9 F (37.2 C)  Resp: 16     General appearance: alert, cooperative and appears stated age Ears: normal TM's and external ear canals both ears Throat: lips, mucosa, and tongue normal; teeth and gums normal Neck: no adenopathy, no carotid bruit, supple, symmetrical, trachea midline and thyroid not enlarged, symmetric, no tenderness/mass/nodules Back: symmetric, no curvature.  ROM normal. No CVA tenderness. Lungs: clear to auscultation bilaterally Heart: regular rate and rhythm, S1, S2 normal, no murmur, click, rub or gallop Abdomen: soft, non-tender; bowel sounds normal; no masses,  no organomegaly Pulses: 2+ and symmetric Skin: Skin color, texture, turgor normal. No rashes or lesions Lymph nodes: Cervical, supraclavicular, and axillary nodes normal.  Assessment and Plan:  Severe obesity (BMI >= 40) Her BMI has increased because she has not been compliant with diet or exercise. I suggested that she consider joining a gym and using a personal trainer to help guide her efforts.   I also am advising her to get back on the low GI diet  using six smaller meals a day to stimulate her metabolism.  Given her history of mild venrticular hypertrophy, we will not resume phentermine    Diabetes mellitus type 2 in obese Well-controlled on diet alone .  hemoglobin A1c has been consistently less than 7.0 . Patient is up-to-date on eye exams and foot exam was done today.  There is  no proteinuria on today's micro urinalysis .  Fasting lipids and A1c  have been ordered and therapy will be advised if indicated according to new ACC guidelines based on patient's 10 year risk of CAD.  I have addressed  BMI and recommended wt loss of 10% of body weight over the next 6 months using a low glycemic index diet and regular exercise a minimum of 5 days per week.    Lab Results  Component Value Date   HGBA1C 6.2* 08/02/2014   Lab Results  Component Value Date   HDL 58 07/28/2014   LDLCALC 114* 07/28/2014   TRIG 105 07/28/2014   CHOLHDL 3.1 11/20/2011   No results found for: MICROALBUR         High blood pressure Well controlled on current regimen. Renal function stable, no changes today. Repeat potassium will be needed.   Lab Results  Component Value Date   CREATININE 0.79 12/09/2013   Lab Results  Component Value Date   NA 139 12/09/2013   K 5.3* 12/09/2013   CL 100 12/09/2013   CO2 25 12/09/2013     Family history of hypertrophic cardiomyopathy ECHO was mildly abnormal in 2012 and genetic testing was advised   A total of 25  minutes of face to face time was spent with patient more than half of which was spent in counselling and coordination of care   Updated Medication List Outpatient Encounter Prescriptions as of 12/06/2014  Medication Sig  . albuterol (PROVENTIL HFA) 108 (90 BASE) MCG/ACT inhaler Inhale 1 puff into the lungs 4 (four) times daily.  Marland Kitchen amLODipine (NORVASC) 5 MG tablet Take one-half tablet by  mouth daily  . cetirizine (ZYRTEC) 10 MG tablet Take 10 mg by mouth as needed.    . hydrochlorothiazide  (MICROZIDE) 12.5 MG capsule Take 1 capsule by mouth  every morning  . mometasone (NASONEX) 50 MCG/ACT nasal spray Place 2 sprays into the nose daily.  . norethindrone-ethinyl estradiol (GILDESS 1/20) 1-20 MG-MCG tablet Take 1 tablet by mouth daily.  . Olopatadine HCl (PATANASE) 0.6 % SOLN Use as directed  . [DISCONTINUED] phentermine (ADIPEX-P) 37.5 MG tablet Take 1 tablet (37.5 mg total) by mouth daily before breakfast.  . ALPRAZolam (XANAX) 0.5 MG tablet Take 1 tablet (0.5 mg total) by mouth as needed for sleep or anxiety. (Patient not taking: Reported on 12/06/2014)     No orders of the defined types were placed in this encounter.  No Follow-up on file.

## 2014-12-06 NOTE — Progress Notes (Signed)
Pre-visit discussion using our clinic review tool. No additional management support is needed unless otherwise documented below in the visit note.  

## 2014-12-08 NOTE — Assessment & Plan Note (Addendum)
Her BMI has increased because she has not been compliant with diet or exercise. I suggested that she consider joining a gym and using a personal trainer to help guide her efforts.   I also am advising her to get back on the low GI diet using six smaller meals a day to stimulate her metabolism.  Given her history of mild venrticular hypertrophy, we will not resume phentermine

## 2014-12-08 NOTE — Assessment & Plan Note (Signed)
Well-controlled on diet alone .  hemoglobin A1c has been consistently less than 7.0 . Patient is up-to-date on eye exams and foot exam was done today.  There is  no proteinuria on today's micro urinalysis .  Fasting lipids and A1c  have been ordered and therapy will be advised if indicated according to new ACC guidelines based on patient's 10 year risk of CAD.  I have addressed  BMI and recommended wt loss of 10% of body weight over the next 6 months using a low glycemic index diet and regular exercise a minimum of 5 days per week.    Lab Results  Component Value Date   HGBA1C 6.2* 08/02/2014   Lab Results  Component Value Date   HDL 58 07/28/2014   LDLCALC 114* 07/28/2014   TRIG 105 07/28/2014   CHOLHDL 3.1 11/20/2011   No results found for: [facility]

## 2014-12-09 ENCOUNTER — Telehealth: Payer: Self-pay | Admitting: Internal Medicine

## 2014-12-09 NOTE — Assessment & Plan Note (Signed)
Well controlled on current regimen. Renal function stable, no changes today. Repeat potassium will be needed.   Lab Results  Component Value Date   CREATININE 0.79 12/09/2013   Lab Results  Component Value Date   NA 139 12/09/2013   K 5.3* 12/09/2013   CL 100 12/09/2013   CO2 25 12/09/2013

## 2014-12-09 NOTE — Telephone Encounter (Signed)
I do not want her to resume the phentermine.  I don't think it's  a good idea given her history of mild ventricular hypertrophy.

## 2014-12-09 NOTE — Assessment & Plan Note (Signed)
ECHO was mildly abnormal in 2012 and genetic testing was advised

## 2014-12-11 NOTE — Telephone Encounter (Signed)
Patient notified and voiced understanding.

## 2015-03-02 LAB — CBC AND DIFFERENTIAL
HCT: 38 % (ref 36–46)
Hemoglobin: 12.5 g/dL (ref 12.0–16.0)
Neutrophils Absolute: 2 /uL
PLATELETS: 414 10*3/uL — AB (ref 150–399)
WBC: 5.2 10*3/mL

## 2015-03-02 LAB — HEPATIC FUNCTION PANEL
ALT: 13 U/L (ref 7–35)
AST: 12 U/L — AB (ref 13–35)
Alkaline Phosphatase: 60 U/L (ref 25–125)
Bilirubin, Total: 0.3 mg/dL

## 2015-03-02 LAB — BASIC METABOLIC PANEL
BUN: 9 mg/dL (ref 4–21)
Creatinine: 0.8 mg/dL (ref 0.5–1.1)
GLUCOSE: 108 mg/dL
POTASSIUM: 4.3 mmol/L (ref 3.4–5.3)
Sodium: 137 mmol/L (ref 137–147)

## 2015-03-02 LAB — TSH: TSH: 1.38 u[IU]/mL (ref 0.41–5.90)

## 2015-03-02 LAB — HEMOGLOBIN A1C: Hgb A1c MFr Bld: 6.4 % — AB (ref 4.0–6.0)

## 2015-03-05 ENCOUNTER — Telehealth: Payer: Self-pay | Admitting: Internal Medicine

## 2015-03-05 NOTE — Telephone Encounter (Signed)
Abstraction  

## 2015-03-06 ENCOUNTER — Encounter: Payer: Self-pay | Admitting: *Deleted

## 2015-03-06 ENCOUNTER — Telehealth: Payer: Self-pay | Admitting: Internal Medicine

## 2015-03-06 NOTE — Telephone Encounter (Signed)
Labs received from [facility].   DM is under good control . Follow up OV needed in June  , will need a1c,  cmet and fasting lipids  prior to visit  E11.9

## 2015-03-06 NOTE — Telephone Encounter (Signed)
Lab sheet placed in quick sign. Awaiting signature. Letter mailed with lab order.

## 2015-03-09 ENCOUNTER — Ambulatory Visit: Payer: 59 | Admitting: Internal Medicine

## 2015-03-22 ENCOUNTER — Ambulatory Visit: Payer: Self-pay | Admitting: Internal Medicine

## 2015-03-29 ENCOUNTER — Encounter: Payer: Self-pay | Admitting: Internal Medicine

## 2015-05-03 ENCOUNTER — Ambulatory Visit (INDEPENDENT_AMBULATORY_CARE_PROVIDER_SITE_OTHER): Payer: Self-pay | Admitting: Nurse Practitioner

## 2015-05-03 ENCOUNTER — Encounter: Payer: Self-pay | Admitting: Nurse Practitioner

## 2015-05-03 VITALS — BP 120/80 | HR 93 | Temp 98.5°F | Resp 12 | Ht 64.0 in | Wt 240.0 lb

## 2015-05-03 DIAGNOSIS — J069 Acute upper respiratory infection, unspecified: Secondary | ICD-10-CM

## 2015-05-03 DIAGNOSIS — B9789 Other viral agents as the cause of diseases classified elsewhere: Principal | ICD-10-CM

## 2015-05-03 MED ORDER — HYDROCOD POLST-CPM POLST ER 10-8 MG/5ML PO SUER: 5.0000 mL | Freq: Every evening | ORAL | Status: DC | PRN

## 2015-05-03 NOTE — Patient Instructions (Signed)
Call us Tuesday if not improving.

## 2015-05-03 NOTE — Progress Notes (Signed)
   Subjective:    Patient ID: Hilma Favors, female    DOB: Apr 20, 1976, 39 y.o.   MRN: [id number]  HPI  Ms. Kobrin is a 39 yo female with a CC of cough x 5 days.   1) 100.5 temp yesterday, started with sore throat, other sick persons at work.   Mucinex and Robitussin- Helpful somewhat  Zyrtec and Nasonex   Review of Systems  Constitutional: Negative for fever, chills, diaphoresis and fatigue.  HENT: Positive for congestion, postnasal drip and sore throat. Negative for ear discharge, ear pain, sinus pressure and sneezing.   Respiratory: Positive for cough. Negative for chest tightness, shortness of breath and wheezing.   Cardiovascular: Negative for chest pain, palpitations and leg swelling.  Gastrointestinal: Negative for nausea, vomiting and diarrhea.  Skin: Negative for rash.  Neurological: Negative for dizziness, numbness and headaches.      Objective:   Physical Exam  Constitutional: She is oriented to person, place, and time. She appears well-developed and well-nourished. No distress.  BP 120/80 mmHg  Pulse 93  Temp(Src) 98.5 F (36.9 C) (Oral)  Resp 12  Ht 5\' 4"  (1.626 m)  Wt 240 lb (108.863 kg)  BMI 41.18 kg/m2  SpO2 97%  LMP 04/06/2015   HENT:  Head: Normocephalic and atraumatic.  Right Ear: External ear normal.  Left Ear: External ear normal.  Mouth/Throat: No oropharyngeal exudate.  Erythematous oropharynx  Eyes: EOM are normal. Pupils are equal, round, and reactive to light. Right eye exhibits no discharge. Left eye exhibits no discharge. No scleral icterus.  Neck: Normal range of motion. Neck supple. No thyromegaly present.  Cardiovascular: Normal rate, regular rhythm and normal heart sounds.  Exam reveals no gallop and no friction rub.   No murmur heard. Pulmonary/Chest: Effort normal and breath sounds normal. No respiratory distress. She has no wheezes. She has no rales. She exhibits no tenderness.  Lymphadenopathy:    She has cervical adenopathy.    Neurological: She is alert and oriented to person, place, and time. No cranial nerve deficit. She exhibits normal muscle tone. Coordination normal.  Skin: Skin is warm and dry. No rash noted. She is not diaphoretic.  Psychiatric: She has a normal mood and affect. Her behavior is normal. Judgment and thought content normal.      Assessment & Plan:  Viral URI w/ cough  1) Gave pt prescription for Tussionex to take to pharmacy 2) Gave verbal instructions of 1 tsp at night 3) Instructed it will cause drowsiness.  4) FU prn worsening/failure to improve.

## 2015-05-03 NOTE — Progress Notes (Signed)
Pre visit review using our clinic review tool, if applicable. No additional management support is needed unless otherwise documented below in the visit note. 

## 2015-05-08 LAB — HEPATIC FUNCTION PANEL
ALK PHOS: 62 U/L (ref 25–125)
ALT: 16 U/L (ref 7–35)
AST: 14 U/L (ref 13–35)
BILIRUBIN, TOTAL: 0.1 mg/dL

## 2015-05-08 LAB — BASIC METABOLIC PANEL
BUN: 10 mg/dL (ref 4–21)
Creatinine: 0.7 mg/dL (ref 0.5–1.1)
Glucose: 111 mg/dL
POTASSIUM: 4.3 mmol/L (ref 3.4–5.3)
Sodium: 137 mmol/L (ref 137–147)

## 2015-05-08 LAB — LIPID PANEL
Cholesterol: 180 mg/dL (ref 0–200)
HDL: 41 mg/dL (ref 35–70)
LDL Cholesterol: 116 mg/dL
LDl/HDL Ratio: 2.8
Triglycerides: 113 mg/dL (ref 40–160)

## 2015-05-08 LAB — HEMOGLOBIN A1C: Hgb A1c MFr Bld: 6.6 % — AB (ref 4.0–6.0)

## 2015-05-09 ENCOUNTER — Ambulatory Visit: Payer: Self-pay | Admitting: Internal Medicine

## 2015-05-09 ENCOUNTER — Encounter: Payer: Self-pay | Admitting: Internal Medicine

## 2015-06-06 ENCOUNTER — Ambulatory Visit (INDEPENDENT_AMBULATORY_CARE_PROVIDER_SITE_OTHER): Payer: 59 | Admitting: Internal Medicine

## 2015-06-06 ENCOUNTER — Encounter: Payer: Self-pay | Admitting: Internal Medicine

## 2015-06-06 VITALS — BP 108/80 | HR 98 | Temp 98.5°F | Resp 14 | Ht 64.0 in | Wt 244.2 lb

## 2015-06-06 DIAGNOSIS — E669 Obesity, unspecified: Secondary | ICD-10-CM

## 2015-06-06 DIAGNOSIS — E119 Type 2 diabetes mellitus without complications: Secondary | ICD-10-CM | POA: Diagnosis not present

## 2015-06-06 DIAGNOSIS — E1169 Type 2 diabetes mellitus with other specified complication: Secondary | ICD-10-CM

## 2015-06-06 NOTE — Patient Instructions (Addendum)
I would like you to lose 20 lbs this year , using regular exercise and either a low carb diet or a low fat diet   Katelyn Wilson  Has written  "10 Day Green Smoothie Detox Diet" available on [place] for $10 , and is very helpful .  You can use it for two meals and then eat a sensible dinner   Please return in 3 months for diabetes recheck,  And get your labs done prior to the visit   Please get the urine test done ASAP

## 2015-06-06 NOTE — Progress Notes (Signed)
Subjective:  Patient ID: Katelyn Wilson, female    DOB: Aug 02, 1976  Age: 39 y.o. MRN: [id number]  CC: The primary encounter diagnosis was Diabetes mellitus type 2 in obese. A diagnosis of Severe obesity (BMI >= 40) was also pertinent to this visit.  HPI Katelyn Wilson presents for follow up on DM Type 2 , diet controlled, with morbid obesity,  And hypertension . Since her last visit she has   gained 4 lbs,  Body mass index is 41.9 kg/(m^2).  She attributes the weight gain to her recent change in positions  At [employer] and the work schedule now involving more meetings and less walking.  However she has not started any regular exercise program and is not  following a weight loss diet.    Lab Results  Component Value Date   HGBA1C 6.6* 05/08/2015      Outpatient Prescriptions Prior to Visit  Medication Sig Dispense Refill  . albuterol (PROVENTIL HFA) 108 (90 BASE) MCG/ACT inhaler Inhale 1 puff into the lungs 4 (four) times daily. 8.5 g 6  . amLODipine (NORVASC) 5 MG tablet Take one-half tablet by  mouth daily 45 tablet 3  . cetirizine (ZYRTEC) 10 MG tablet Take 10 mg by mouth as needed.      . hydrochlorothiazide (MICROZIDE) 12.5 MG capsule Take 1 capsule by mouth  every morning 90 capsule 1  . mometasone (NASONEX) 50 MCG/ACT nasal spray Place 2 sprays into the nose daily. 17 g 6  . norethindrone-ethinyl estradiol (GILDESS 1/20) 1-20 MG-MCG tablet Take 1 tablet by mouth daily.    . Olopatadine HCl (PATANASE) 0.6 % SOLN Use as directed 30.5 g 6  . chlorpheniramine-HYDROcodone (TUSSIONEX PENNKINETIC ER) 10-8 MG/5ML SUER Take 5 mLs by mouth at bedtime as needed for cough. 115 mL 0   No facility-administered medications prior to visit.    Review of Systems;  Patient denies headache, fevers, malaise, unintentional weight loss, skin rash, eye pain, sinus congestion and sinus pain, sore throat, dysphagia,  hemoptysis , cough, dyspnea, wheezing, chest pain, palpitations, orthopnea, edema,  abdominal pain, nausea, melena, diarrhea, constipation, flank pain, dysuria, hematuria, urinary  Frequency, nocturia, numbness, tingling, seizures,  Focal weakness, Loss of consciousness,  Tremor, insomnia, depression, anxiety, and suicidal ideation.      Objective:  BP 108/80 mmHg  Pulse 98  Temp(Src) 98.5 F (36.9 C) (Oral)  Resp 14  Ht 5\' 4"  (1.626 m)  Wt 244 lb 4 oz (110.791 kg)  BMI 41.90 kg/m2  SpO2 98%  LMP 05/31/2015 (Exact Date)  BP Readings from Last 3 Encounters:  06/06/15 108/80  05/03/15 120/80  12/06/14 120/88    Wt Readings from Last 3 Encounters:  06/06/15 244 lb 4 oz (110.791 kg)  05/03/15 240 lb (108.863 kg)  12/06/14 244 lb 8 oz (110.904 kg)    General appearance: alert, cooperative and appears stated age Ears: normal TM's and external ear canals both ears Throat: lips, mucosa, and tongue normal; teeth and gums normal Neck: no adenopathy, no carotid bruit, supple, symmetrical, trachea midline and thyroid not enlarged, symmetric, no tenderness/mass/nodules Back: symmetric, no curvature. ROM normal. No CVA tenderness. Lungs: clear to auscultation bilaterally Heart: regular rate and rhythm, S1, S2 normal, no murmur, click, rub or gallop Abdomen: soft, non-tender; bowel sounds normal; no masses,  no organomegaly Pulses: 2+ and symmetric Skin: Skin color, texture, turgor normal. No rashes or lesions Lymph nodes: Cervical, supraclavicular, and axillary nodes normal. Foot exam:  Nails are well trimmed,  No callouses,  Sensation intact to microfilament   Lab Results  Component Value Date   HGBA1C 6.6* 05/08/2015   HGBA1C 6.4* 03/02/2015   HGBA1C 6.2* 08/02/2014    Lab Results  Component Value Date   CREATININE 0.7 05/08/2015   CREATININE 0.8 03/02/2015   CREATININE 0.79 12/09/2013    Lab Results  Component Value Date   WBC 5.2 03/02/2015   HGB 12.5 03/02/2015   HCT 38 03/02/2015   PLT 414* 03/02/2015   GLUCOSE 116* 12/09/2013   CHOL 180  05/08/2015   TRIG 113 05/08/2015   HDL 41 05/08/2015   LDLCALC 116 05/08/2015   ALT 16 05/08/2015   AST 14 05/08/2015   NA 137 05/08/2015   K 4.3 05/08/2015   CL 100 12/09/2013   CREATININE 0.7 05/08/2015   BUN 10 05/08/2015   CO2 25 12/09/2013   TSH 1.38 03/02/2015   HGBA1C 6.6* 05/08/2015    No results found.  Assessment & Plan:   Problem List Items Addressed This Visit      Unprioritized   Diabetes mellitus type 2 in obese - Primary    Remains controlled on diet alone,  but the progressive upward trend in her A1c was dsicussed today  .  Marland Kitchen Patient is up-to-date on eye exams and foot exam was done today.  There is  no proteinuria on today's micro urinalysis .  Lab Results  Component Value Date   HGBA1C 6.6* 05/08/2015   Lab Results  Component Value Date   CHOL 180 05/08/2015   HDL 41 05/08/2015   LDLCALC 116 05/08/2015   TRIG 113 05/08/2015   CHOLHDL 3.1 11/20/2011   No results found for: MICROALBUR               Severe obesity (BMI >= 40)    She has not fulfilled the expectations of the health insurance appeal for waiver that I signed last year and is now at risk for elevated premiums.  I will not sign the waiver again since I did so in good faith and have seen no real effort on the patient's part to make her health a priority other than keep her appointments with me. I have addressed  BMI and recommended wt loss of 10% of body weigh over the next 6 months using a low glycemic index diet and regular exercise a minimum of 5 days per week.          A total of 25 minutes of face to face time was spent with patient more than half of which was spent in counselling about the above mentioned conditions  and coordination of care  I have discontinued Ms. Katelyn Wilson's chlorpheniramine-HYDROcodone. I am also having her maintain her cetirizine, norethindrone-ethinyl estradiol, albuterol, mometasone, Olopatadine HCl, amLODipine, and hydrochlorothiazide.  No orders of the  defined types were placed in this encounter.    Medications Discontinued During This Encounter  Medication Reason  . chlorpheniramine-HYDROcodone (TUSSIONEX PENNKINETIC ER) 10-8 MG/5ML SUER Completed Course    Follow-up: Return in about 3 months (around 09/06/2015) for follow up diabetes.   Crecencio Mc, MD

## 2015-06-06 NOTE — Progress Notes (Signed)
Pre-visit discussion using our clinic review tool. No additional management support is needed unless otherwise documented below in the visit note.  

## 2015-06-09 ENCOUNTER — Encounter: Payer: Self-pay | Admitting: Internal Medicine

## 2015-06-09 NOTE — Assessment & Plan Note (Signed)
She has not fulfilled the expectations of the health insurance appeal for waiver that I signed last year and is now at risk for elevated premiums.  I will not sign the waiver again since I did so in good faith and have seen no real effort on the patient's part to make her health a priority other than keep her appointments with me. I have addressed  BMI and recommended wt loss of 10% of body weigh over the next 6 months using a low glycemic index diet and regular exercise a minimum of 5 days per week.

## 2015-06-09 NOTE — Assessment & Plan Note (Signed)
Remains controlled on diet alone,  but the progressive upward trend in her A1c was dsicussed today  .  Marland Kitchen Patient is up-to-date on eye exams and foot exam was done today.  There is  no proteinuria on today's micro urinalysis .  Lab Results  Component Value Date   HGBA1C 6.6* 05/08/2015   Lab Results  Component Value Date   CHOL 180 05/08/2015   HDL 41 05/08/2015   LDLCALC 116 05/08/2015   TRIG 113 05/08/2015   CHOLHDL 3.1 11/20/2011   No results found for: [facility]

## 2015-06-14 ENCOUNTER — Encounter: Payer: Self-pay | Admitting: Internal Medicine

## 2015-09-07 ENCOUNTER — Ambulatory Visit (INDEPENDENT_AMBULATORY_CARE_PROVIDER_SITE_OTHER): Payer: 59 | Admitting: Internal Medicine

## 2015-09-07 DIAGNOSIS — E669 Obesity, unspecified: Secondary | ICD-10-CM

## 2015-09-07 DIAGNOSIS — E119 Type 2 diabetes mellitus without complications: Secondary | ICD-10-CM

## 2015-09-07 NOTE — Progress Notes (Signed)
Patient failed to keep scheduled appointment and will be charged a no show fee.   

## 2015-09-07 NOTE — Assessment & Plan Note (Signed)
Patient failed to keep scheduled appointment and will be charged a no show fee.   

## 2015-09-14 ENCOUNTER — Encounter: Payer: Self-pay | Admitting: Internal Medicine

## 2015-09-28 ENCOUNTER — Other Ambulatory Visit: Payer: Self-pay | Admitting: Internal Medicine

## 2015-10-09 ENCOUNTER — Encounter: Payer: Self-pay | Admitting: Obstetrics and Gynecology

## 2015-10-16 ENCOUNTER — Encounter: Payer: Self-pay | Admitting: Obstetrics and Gynecology

## 2015-10-24 ENCOUNTER — Other Ambulatory Visit: Payer: Self-pay | Admitting: Internal Medicine

## 2015-10-25 ENCOUNTER — Telehealth: Payer: 59 | Admitting: Physician Assistant

## 2015-10-25 DIAGNOSIS — J208 Acute bronchitis due to other specified organisms: Principal | ICD-10-CM

## 2015-10-25 DIAGNOSIS — B9689 Other specified bacterial agents as the cause of diseases classified elsewhere: Secondary | ICD-10-CM

## 2015-10-25 DIAGNOSIS — J00 Acute nasopharyngitis [common cold]: Secondary | ICD-10-CM

## 2015-10-25 MED ORDER — DOXYCYCLINE HYCLATE 100 MG PO CAPS: 100.0000 mg | ORAL_CAPSULE | Freq: Two times a day (BID) | ORAL | Status: DC

## 2015-10-25 MED ORDER — BENZONATATE 100 MG PO CAPS: 100.0000 mg | ORAL_CAPSULE | Freq: Three times a day (TID) | ORAL | Status: DC | PRN

## 2015-10-25 NOTE — Progress Notes (Signed)
We are sorry that you are not feeling well.  Here is how we plan to help!  Based on what you have shared with me it looks like you have upper respiratory tract inflammation that has resulted in a significant cough.  Inflammation and infection in the upper respiratory tract is commonly called bronchitis and has four common causes:  Allergies, Viral Infections, Acid Reflux and Bacterial Infections.  Allergies, viruses and acid reflux are treated by controlling symptoms or eliminating the cause. An example might be a cough caused by taking certain blood pressure medications. You stop the cough by changing the medication. Another example might be a cough caused by acid reflux. Controlling the reflux helps control the cough.  Based on your presentation I believe you most likely have A cough due to bacteria.  When patients have a fever and a productive cough with a change in color or increased sputum production, we are concerned about bacterial bronchitis.  If left untreated it can progress to pneumonia.  If your symptoms do not improve with your treatment plan it is important that you contact your provider.   I hve prescribed Doxycycline 100 mg twice a day for 7 days    In addition you may use A prescription cough medication called Tessalon Perles 100mg . You may take 1-2 capsules every 8 hours as needed for your cough.    HOME CARE . Only take medications as instructed by your medical team. . Complete the entire course of an antibiotic. . Drink plenty of fluids and get plenty of rest. . Avoid close contacts especially the very young and the elderly . Cover your mouth if you cough or cough into your sleeve. . Always remember to wash your hands . A steam or ultrasonic humidifier can help congestion.    GET HELP RIGHT AWAY IF: . You develop worsening fever. . You become short of breath . You cough up blood. . Your symptoms persist after you have completed your treatment plan MAKE SURE YOU    Understand these instructions.  Will watch your condition.  Will get help right away if you are not doing well or get worse.  Your e-visit answers were reviewed by a board certified advanced clinical practitioner to complete your personal care plan.  Depending on the condition, your plan could have included both over the counter or prescription medications. If there is a problem please reply  once you have received a response from your provider. Your safety is important to [place].  If you have drug allergies check your prescription carefully.    You can use MyChart to ask questions about today's visit, request a non-urgent call back, or ask for a work or school excuse for 24 hours related to this e-Visit. If it has been greater than 24 hours you will need to follow up with your provider, or enter a new e-Visit to address those concerns. You will get an e-mail in the next two days asking about your experience.  I hope that your e-visit has been valuable and will speed your recovery. Thank you for using e-visits.

## 2015-11-08 ENCOUNTER — Encounter: Payer: Self-pay | Admitting: Internal Medicine

## 2015-11-12 ENCOUNTER — Ambulatory Visit (INDEPENDENT_AMBULATORY_CARE_PROVIDER_SITE_OTHER): Payer: 59 | Admitting: Internal Medicine

## 2015-11-12 ENCOUNTER — Encounter: Payer: Self-pay | Admitting: Internal Medicine

## 2015-11-12 VITALS — BP 128/88 | HR 84 | Temp 98.5°F | Resp 14 | Ht 64.0 in | Wt 237.5 lb

## 2015-11-12 DIAGNOSIS — J4521 Mild intermittent asthma with (acute) exacerbation: Secondary | ICD-10-CM

## 2015-11-12 DIAGNOSIS — J45909 Unspecified asthma, uncomplicated: Secondary | ICD-10-CM | POA: Insufficient documentation

## 2015-11-12 MED ORDER — PREDNISONE 10 MG PO TABS: ORAL_TABLET | ORAL | Status: DC

## 2015-11-12 MED ORDER — GUAIFENESIN-CODEINE 100-10 MG/5ML PO SYRP: 10.0000 mL | ORAL_SOLUTION | Freq: Three times a day (TID) | ORAL | Status: DC | PRN

## 2015-11-12 MED ORDER — AZITHROMYCIN 500 MG PO TABS: 500.0000 mg | ORAL_TABLET | Freq: Every day | ORAL | Status: DC

## 2015-11-12 NOTE — Progress Notes (Signed)
Subjective:  Patient ID: Katelyn Wilson, female    DOB: March 14, 1976  Age: 39 y.o. MRN: [id number]  CC: The encounter diagnosis was Asthma with exacerbation, mild intermittent.  HPI Katelyn Wilson presents for persistent cough.  Patient was treated on Nov 17th via E visit for bronchitis with doxycycline and tessalon perlees.  Sputum cleared up with the doxy, but the cough returned once the doxy was finished.   Dry cough  ,  Worse when she lies down,  Aggravated by talking   Still taking the perles at night  Some nights it helps  often wakes up at 4 a m coughing,  No sinus drainage anymore.  Wheezing,  Using inhaler a lot more       Outpatient Prescriptions Prior to Visit  Medication Sig Dispense Refill  . amLODipine (NORVASC) 5 MG tablet Take one-half tablet by  mouth daily 45 tablet 2  . benzonatate (TESSALON) 100 MG capsule Take 1 capsule (100 mg total) by mouth 3 (three) times daily as needed for cough. 30 capsule 0  . cetirizine (ZYRTEC) 10 MG tablet Take 10 mg by mouth as needed.      . hydrochlorothiazide (MICROZIDE) 12.5 MG capsule Take 1 capsule by mouth  every morning 90 capsule 1  . mometasone (NASONEX) 50 MCG/ACT nasal spray Place 2 sprays into the nose daily. 17 g 6  . norethindrone-ethinyl estradiol (GILDESS 1/20) 1-20 MG-MCG tablet Take 1 tablet by mouth daily.    . Olopatadine HCl (PATANASE) 0.6 % SOLN Use as directed 30.5 g 6  . PROVENTIL HFA 108 (90 BASE) MCG/ACT inhaler INHALE ONE PUFF BY MOUTH FOUR TIMES DAILY 6.7 Inhaler 1  . doxycycline (VIBRAMYCIN) 100 MG capsule Take 1 capsule (100 mg total) by mouth 2 (two) times daily. (Patient not taking: Reported on 11/12/2015) 14 capsule 0   No facility-administered medications prior to visit.    Review of Systems;  Patient denies headache, fevers, malaise, unintentional weight loss, skin rash, eye pain, sinus congestion and sinus pain, sore throat, dysphagia,  hemoptysis , cough, dyspnea, wheezing, chest pain, palpitations,  orthopnea, edema, abdominal pain, nausea, melena, diarrhea, constipation, flank pain, dysuria, hematuria, urinary  Frequency, nocturia, numbness, tingling, seizures,  Focal weakness, Loss of consciousness,  Tremor, insomnia, depression, anxiety, and suicidal ideation.      Objective:  BP 128/88 mmHg  Pulse 84  Temp(Src) 98.5 F (36.9 C) (Oral)  Resp 14  Ht 5\' 4"  (1.626 m)  Wt 237 lb 8 oz (107.729 kg)  BMI 40.75 kg/m2  SpO2 98%  LMP 10/17/2015 (Exact Date)  BP Readings from Last 3 Encounters:  11/12/15 128/88  06/06/15 108/80  05/03/15 120/80    Wt Readings from Last 3 Encounters:  11/12/15 237 lb 8 oz (107.729 kg)  06/06/15 244 lb 4 oz (110.791 kg)  05/03/15 240 lb (108.863 kg)    General appearance: alert, cooperative and appears stated age Ears: normal TM's and external ear canals both ears Throat: lips, mucosa, and tongue normal; teeth and gums normal Neck: no adenopathy, no carotid bruit, supple, symmetrical, trachea midline and thyroid not enlarged, symmetric, no tenderness/mass/nodules Back: symmetric, no curvature. ROM normal. No CVA tenderness. Lungs:bilateral wheezing,  With fair air movement , no egophony Heart: regular rate and rhythm, S1, S2 normal, no murmur, click, rub or gallop Abdomen: soft, non-tender; bowel sounds normal; no masses,  no organomegaly Pulses: 2+ and symmetric Skin: Skin color, texture, turgor normal. No rashes or lesions Lymph nodes: Cervical, supraclavicular, and  axillary nodes normal.  Lab Results  Component Value Date   HGBA1C 6.6* 05/08/2015   HGBA1C 6.4* 03/02/2015   HGBA1C 6.2* 08/02/2014    Lab Results  Component Value Date   CREATININE 0.7 05/08/2015   CREATININE 0.8 03/02/2015   CREATININE 0.79 12/09/2013    Lab Results  Component Value Date   WBC 5.2 03/02/2015   HGB 12.5 03/02/2015   HCT 38 03/02/2015   PLT 414* 03/02/2015   GLUCOSE 116* 12/09/2013   CHOL 180 05/08/2015   TRIG 113 05/08/2015   HDL 41 05/08/2015    LDLCALC 116 05/08/2015   ALT 16 05/08/2015   AST 14 05/08/2015   NA 137 05/08/2015   K 4.3 05/08/2015   CL 100 12/09/2013   CREATININE 0.7 05/08/2015   BUN 10 05/08/2015   CO2 25 12/09/2013   TSH 1.38 03/02/2015   HGBA1C 6.6* 05/08/2015    No results found.  Assessment & Plan:   Problem List Items Addressed This Visit    Asthma with exacerbation - Primary   Relevant Medications   predniSONE (DELTASONE) 10 MG tablet      I am having Ms. Mccreery start on predniSONE, azithromycin, and guaiFENesin-codeine. I am also having her maintain her cetirizine, norethindrone-ethinyl estradiol, mometasone, Olopatadine HCl, hydrochlorothiazide, amLODipine, PROVENTIL HFA, benzonatate, and doxycycline.  Meds ordered this encounter  Medications  . predniSONE (DELTASONE) 10 MG tablet    Sig: 6 tablets daily on Days 1 , 2 and 3 ,   then reduce by 1 tablet daily until gone    Dispense:  33 tablet    Refill:  0  . azithromycin (ZITHROMAX) 500 MG tablet    Sig: Take 1 tablet (500 mg total) by mouth daily.    Dispense:  7 tablet    Refill:  0  . guaiFENesin-codeine (CHERATUSSIN AC) 100-10 MG/5ML syrup    Sig: Take 10 mLs by mouth 3 (three) times daily as needed for cough.    Dispense:  180 mL    Refill:  0    There are no discontinued medications.  Follow-up: No Follow-up on file.   Crecencio Mc, MD

## 2015-11-12 NOTE — Progress Notes (Signed)
Pre-visit discussion using our clinic review tool. No additional management support is needed unless otherwise documented below in the visit note.  

## 2015-11-12 NOTE — Patient Instructions (Addendum)
  I am treating you for an asthma exacerbation   Prednisone:  60 mg  daily for 3 days,  Then begin to taper by 10 mg daily until gone  Azoithromycin 500 mg daily for 7 days Cheratussin   10 ml every 8 hours as needed for cough  Continue to use your  albuterol inhaler as needed   Please take a probiotic ( Align, Floraque or Culturelle) of the generic version of one of these  For a minimum of 3 weeks to prevent a serious antibiotic associated diarrhea  Called clostridium dificile colitis   OR YOU CAN EAT YOGURT DAILY WITH LOVE CULTURES      We will see you on Thursday or Friday if you are not  improving

## 2015-11-14 NOTE — Assessment & Plan Note (Signed)
  You are having an asthma exacerbation which may aggravate your heart disease  If you become more short of breath than you are now,  You need to go to the nearest ER immediately or call 911  I will try to treat you at home with the following:  Prednisone:  60 mg  daily for 3 days,  Then begin to taper by 10 mg daily until gone   Tessalon perles  100 mg everu 8 hours as needed for cough   Continue to use your [facility] as directed and uour albuterol inhaler as needed   Return on Tuesday to make sure you are improving

## 2015-11-23 ENCOUNTER — Other Ambulatory Visit: Payer: Self-pay | Admitting: Obstetrics and Gynecology

## 2015-11-23 ENCOUNTER — Ambulatory Visit (INDEPENDENT_AMBULATORY_CARE_PROVIDER_SITE_OTHER): Payer: 59 | Admitting: Obstetrics and Gynecology

## 2015-11-23 ENCOUNTER — Encounter: Payer: Self-pay | Admitting: Obstetrics and Gynecology

## 2015-11-23 VITALS — BP 118/68 | HR 85 | Ht 64.0 in | Wt 238.3 lb

## 2015-11-23 DIAGNOSIS — Z01419 Encounter for gynecological examination (general) (routine) without abnormal findings: Secondary | ICD-10-CM

## 2015-11-23 NOTE — Patient Instructions (Signed)
Place annual gynecologic exam patient instructions here.

## 2015-11-23 NOTE — Progress Notes (Signed)
Subjective:   Katelyn Wilson is a 39 y.o. No obstetric history on file. African American female here for a routine well-woman exam.  Patient's last menstrual period was 11/15/2015.    Current complaints: none, stopped OCPs due to frequent bleeding six months ago, and is now having regular bleeding once a month.  PCP: Derrel Nip       Does need labs  Social History: Sexual: heterosexual Marital Status: married Living situation: with family Occupation: unknown occupation Tobacco/alcohol: no tobacco use Illicit drugs: no history of illicit drug use  The following portions of the patient's history were reviewed and updated as appropriate: allergies, current medications, past family history, past medical history, past social history, past surgical history and problem list.  Past Medical History Past Medical History  Diagnosis Date  . Diabetes mellitus   . Obesity   . Obesity (BMI 30-39.9)   . Anxiety   . Hypertension   . allergic rhinitis     Past Surgical History Past Surgical History  Procedure Laterality Date  . None      Gynecologic History No obstetric history on file.  Patient's last menstrual period was 11/15/2015. Contraception: condoms Last Pap: 2013. Results were: normal  Obstetric History OB History  No data available    Current Medications Current Outpatient Prescriptions on File Prior to Visit  Medication Sig Dispense Refill  . amLODipine (NORVASC) 5 MG tablet Take one-half tablet by  mouth daily 45 tablet 2  . cetirizine (ZYRTEC) 10 MG tablet Take 10 mg by mouth as needed.      . hydrochlorothiazide (MICROZIDE) 12.5 MG capsule Take 1 capsule by mouth  every morning 90 capsule 1  . mometasone (NASONEX) 50 MCG/ACT nasal spray Place 2 sprays into the nose daily. 17 g 6  . Olopatadine HCl (PATANASE) 0.6 % SOLN Use as directed 30.5 g 6  . PROVENTIL HFA 108 (90 BASE) MCG/ACT inhaler INHALE ONE PUFF BY MOUTH FOUR TIMES DAILY 6.7 Inhaler 1  . azithromycin (ZITHROMAX)  500 MG tablet Take 1 tablet (500 mg total) by mouth daily. (Patient not taking: Reported on 11/23/2015) 7 tablet 0  . benzonatate (TESSALON) 100 MG capsule Take 1 capsule (100 mg total) by mouth 3 (three) times daily as needed for cough. (Patient not taking: Reported on 11/23/2015) 30 capsule 0  . doxycycline (VIBRAMYCIN) 100 MG capsule Take 1 capsule (100 mg total) by mouth 2 (two) times daily. (Patient not taking: Reported on 11/12/2015) 14 capsule 0  . guaiFENesin-codeine (CHERATUSSIN AC) 100-10 MG/5ML syrup Take 10 mLs by mouth 3 (three) times daily as needed for cough. (Patient not taking: Reported on 11/23/2015) 180 mL 0  . predniSONE (DELTASONE) 10 MG tablet 6 tablets daily on Days 1 , 2 and 3 ,   then reduce by 1 tablet daily until gone (Patient not taking: Reported on 11/23/2015) 33 tablet 0   No current facility-administered medications on file prior to visit.    Review of Systems Patient denies any headaches, blurred vision, shortness of breath, chest pain, abdominal pain, problems with bowel movements, urination, or intercourse.  Objective:  BP 118/68 mmHg  Pulse 85  Ht 5\' 4"  (1.626 m)  Wt 238 lb 4.8 oz (108.092 kg)  BMI 40.88 kg/m2  LMP 11/15/2015 Physical Exam  General:  Well developed, well nourished, no acute distress. She is alert and oriented x3. Skin:  Warm and dry Neck:  Midline trachea, no thyromegaly or nodules Cardiovascular: Regular rate and rhythm, no murmur heard Lungs:  Effort normal, all lung fields clear to auscultation bilaterally Breasts:  No dominant palpable mass, retraction, or nipple discharge Abdomen:  Soft, non tender, no hepatosplenomegaly or masses Pelvic:  External genitalia is normal in appearance.  The vagina is normal in appearance. The cervix is bulbous, no CMT.  Thin prep pap is done with HR HPV cotesting. Uterus is felt to be enlarged with fibroids, but no change from last exam.  No adnexal masses or tenderness noted. Extremities:  No swelling  or varicosities noted Psych:  She has a normal mood and affect  Assessment:   Healthy well-woman exam Hypertension Obesity Uterine fibroids  Plan:  Pap and labs obtained F/U 1 year for AE, or sooner if needed   Melody Rockney Ghee, CNM

## 2015-11-24 LAB — LIPID PANEL
CHOL/HDL RATIO: 2.7 ratio (ref 0.0–4.4)
CHOLESTEROL TOTAL: 160 mg/dL (ref 100–199)
HDL: 59 mg/dL (ref >39)
LDL Calculated: 85 mg/dL (ref 0–99)
TRIGLYCERIDES: 80 mg/dL (ref 0–149)
VLDL Cholesterol Cal: 16 mg/dL (ref 5–40)

## 2015-11-24 LAB — COMPREHENSIVE METABOLIC PANEL
A/G RATIO: 1.3 (ref 1.1–2.5)
ALT: 13 IU/L (ref 0–32)
AST: 11 IU/L (ref 0–40)
Albumin: 3.8 g/dL (ref 3.5–5.5)
Alkaline Phosphatase: 62 IU/L (ref 39–117)
BUN/Creatinine Ratio: 16 (ref 8–20)
BUN: 13 mg/dL (ref 6–20)
Bilirubin Total: 0.3 mg/dL (ref 0.0–1.2)
CALCIUM: 9.2 mg/dL (ref 8.7–10.2)
CO2: 24 mmol/L (ref 18–29)
Chloride: 98 mmol/L (ref 96–106)
Creatinine, Ser: 0.79 mg/dL (ref 0.57–1.00)
GFR, EST AFRICAN AMERICAN: 110 mL/min/{1.73_m2} (ref >59)
GFR, EST NON AFRICAN AMERICAN: 95 mL/min/{1.73_m2} (ref >59)
Globulin, Total: 3 g/dL (ref 1.5–4.5)
Glucose: 106 mg/dL — ABNORMAL HIGH (ref 65–99)
POTASSIUM: 4.4 mmol/L (ref 3.5–5.2)
Sodium: 137 mmol/L (ref 134–144)
TOTAL PROTEIN: 6.8 g/dL (ref 6.0–8.5)

## 2015-11-24 LAB — HEMOGLOBIN A1C
Est. average glucose Bld gHb Est-mCnc: 140 mg/dL
Hgb A1c MFr Bld: 6.5 % — ABNORMAL HIGH (ref 4.8–5.6)

## 2015-11-26 LAB — CYTOLOGY - PAP

## 2015-11-28 ENCOUNTER — Encounter: Payer: Self-pay | Admitting: Obstetrics and Gynecology

## 2015-12-05 ENCOUNTER — Encounter: Payer: Self-pay | Admitting: Internal Medicine

## 2015-12-05 ENCOUNTER — Other Ambulatory Visit (INDEPENDENT_AMBULATORY_CARE_PROVIDER_SITE_OTHER): Payer: 59

## 2015-12-05 ENCOUNTER — Ambulatory Visit (INDEPENDENT_AMBULATORY_CARE_PROVIDER_SITE_OTHER)
Admission: RE | Admit: 2015-12-05 | Discharge: 2015-12-05 | Disposition: A | Discharge status: Home / Self Care | Payer: 59 | Source: Ambulatory Visit | Attending: Internal Medicine | Admitting: Internal Medicine

## 2015-12-05 ENCOUNTER — Ambulatory Visit (INDEPENDENT_AMBULATORY_CARE_PROVIDER_SITE_OTHER): Payer: 59 | Admitting: Internal Medicine

## 2015-12-05 VITALS — BP 114/88 | HR 89 | Ht 64.0 in | Wt 237.0 lb

## 2015-12-05 DIAGNOSIS — J45991 Cough variant asthma: Secondary | ICD-10-CM | POA: Diagnosis not present

## 2015-12-05 LAB — CBC WITH DIFFERENTIAL/PLATELET
BASOS ABS: 0 10*3/uL (ref 0.0–0.1)
Basophils Relative: 0.6 % (ref 0.0–3.0)
Eosinophils Absolute: 0.3 10*3/uL (ref 0.0–0.7)
Eosinophils Relative: 5.9 % — ABNORMAL HIGH (ref 0.0–5.0)
HEMATOCRIT: 40.1 % (ref 36.0–46.0)
HEMOGLOBIN: 12.9 g/dL (ref 12.0–15.0)
LYMPHS PCT: 33.3 % (ref 12.0–46.0)
Lymphs Abs: 1.6 10*3/uL (ref 0.7–4.0)
MCHC: 32.2 g/dL (ref 30.0–36.0)
MCV: 87.4 fl (ref 78.0–100.0)
MONOS PCT: 5.9 % (ref 3.0–12.0)
Monocytes Absolute: 0.3 10*3/uL (ref 0.1–1.0)
NEUTROS ABS: 2.7 10*3/uL (ref 1.4–7.7)
Neutrophils Relative %: 54.3 % (ref 43.0–77.0)
Platelets: 376 10*3/uL (ref 150.0–400.0)
RBC: 4.59 Mil/uL (ref 3.87–5.11)
RDW: 14.5 % (ref 11.5–15.5)
WBC: 4.9 10*3/uL (ref 4.0–10.5)

## 2015-12-05 LAB — NITRIC OXIDE: NITRIC OXIDE: 62

## 2015-12-05 MED ORDER — FAMOTIDINE 20 MG PO TABS: ORAL_TABLET | ORAL | Status: DC

## 2015-12-05 MED ORDER — PANTOPRAZOLE SODIUM 40 MG PO TBEC: 40.0000 mg | DELAYED_RELEASE_TABLET | Freq: Every day | ORAL | Status: DC

## 2015-12-05 NOTE — Patient Instructions (Addendum)
Please remember to go to the lab and x-ray department downstairs for your tests - we will call you with the results when they are available.  Pantoprazole (protonix) 40 mg   Take  30-60 min before first meal of the day and Pepcid (famotidine)  20 mg one @  bedtime  X 4 weeks then return to this clinic if not back to normal.  If better, ok to wean off these medication but play close attention to diet   GERD (REFLUX)  is an extremely common cause of respiratory symptoms just like yours , many times with no obvious heartburn at all.    It can be treated with medication, but also with lifestyle changes including elevation of the head of your bed (ideally with 6 inch  bed blocks),  Smoking cessation, avoidance of late meals, excessive alcohol, and avoid fatty foods, chocolate, peppermint, colas, red wine, and acidic juices such as orange juice.  NO MINT OR MENTHOL PRODUCTS SO NO COUGH DROPS  USE SUGARLESS CANDY INSTEAD (Jolley ranchers or Stover's or Life Savers) or even ice chips will also do - the key is to swallow to prevent all throat clearing. NO OIL BASED VITAMINS - use powdered substitutes.

## 2015-12-05 NOTE — Progress Notes (Signed)
Subjective:     Patient ID: Katelyn Wilson, female   DOB: [birth year],    MRN: [id number]  HPI  65 yobf never smoker dx with asthma age 39 eval by ent originally with predominantly upper airway symptom "allergy to everything" rx  Shots until age 27 helped the upper symptoms with occ need for saba mostly in spring but since Tgiving 2016 much worse coughing ? [facility] [place] related rx with doxy/ tessilon / zpak / codeine and prednisone which completed the cough but still wheeze/ sob so self referred 12/05/2015 to pulmonary clinic.   12/05/2015 1st [facility] office visit/ Hodaya Curto   Chief Complaint  Patient presents with  . Pulmonary Consult    Self referral. Pt states that she was dxed with Asthma in 2008. She states she has asthma flare in Nov 2016- cough, wheezing, SOB. These symptoms have improved, but she continues to wheeze.  She is using rescue inhaler 1 x per wk on average.   no pattern to when need to use saba but didn't need it at all prior to onset of cough in Nov 2016  - noct cough is gone  Has not tried to go back to working out yet   No obvious day to day or daytime variability or assoc  excess/ purulent sputum or mucus plugs  or cp or chest tightness,  or overt sinus or hb symptoms. No unusual exp hx or h/o childhood pna/ asthma or knowledge of premature birth.  Sleeping ok without nocturnal  or early am exacerbation  of respiratory  c/o's or need for noct saba. Also denies any obvious fluctuation of symptoms with weather or environmental changes or other aggravating or alleviating factors except as outlined above   Current Medications, Allergies, Complete Past Medical History, Past Surgical History, Family History, and Social History were reviewed in [employer] [facility] record.  ROS  The following are not active complaints unless bolded sore throat, dysphagia, dental problems, itching, sneezing,  nasal congestion or excess/ purulent secretions, ear ache,   fever, chills,  sweats, unintended wt loss, classically pleuritic or exertional cp, hemoptysis,  orthopnea pnd or leg swelling, presyncope, palpitations, abdominal pain, anorexia, nausea, vomiting, diarrhea  or change in bowel or bladder habits, change in stools or urine, dysuria,hematuria,  rash, arthralgias, visual complaints, headache, numbness, weakness or ataxia or problems with walking or coordination,  change in mood/affect or memory.         Review of Systems     Objective:   Physical Exam    amb bf nad /some throat clearing    Wt Readings from Last 3 Encounters:  12/05/15 237 lb (107.502 kg)  11/23/15 238 lb 4.8 oz (108.092 kg)  11/12/15 237 lb 8 oz (107.729 kg)    Vital signs reviewed   HEENT: nl dentition, turbinates, and oropharynx. Nl external ear canals without cough reflex   NECK :  without JVD/Nodes/TM/ nl carotid upstrokes bilaterally   LUNGS: no acc muscle use,  Nl contour chest which is clear to A and P bilaterally without cough on insp or exp maneuvers/ mild pseudowheeze   CV:  RRR  no s3 or murmur or increase in P2, no edema   ABD:  soft and nontender with nl inspiratory excursion in the supine position. No bruits or organomegaly, bowel sounds nl  MS:  Nl gait/ ext warm without deformities, calf tenderness, cyanosis or clubbing No obvious joint restrictions   SKIN: warm and dry without lesions    NEURO:  alert, approp, nl sensorium with  no motor deficits   CXR PA and Lateral:   12/05/2015 :    I personally reviewed images and agree with radiology impression as follows:      Questionable overlapping shadows versus parenchymal density right upper lobe. Repeat PA and lateral chest x-ray with apical lordotic chest x-ray suggested for further evaluation    Assessment:

## 2015-12-06 ENCOUNTER — Other Ambulatory Visit: Payer: Self-pay | Admitting: Obstetrics and Gynecology

## 2015-12-06 ENCOUNTER — Telehealth: Payer: Self-pay | Admitting: Internal Medicine

## 2015-12-06 DIAGNOSIS — J45991 Cough variant asthma: Secondary | ICD-10-CM

## 2015-12-06 LAB — ALLERGY FULL PROFILE
Allergen, D pternoyssinus,d7: 4.4 kU/L — ABNORMAL HIGH
Allergen,Goose feathers, e70: <0.1 kU/L
Aspergillus fumigatus, m3: <0.1 kU/L
BAHIA GRASS: 0.9 kU/L — AB
BERMUDA GRASS: 0.34 kU/L — AB
BOX ELDER: 0.48 kU/L — AB
CAT DANDER: 1.42 kU/L — AB
Candida Albicans: 0.35 kU/L — ABNORMAL HIGH
Common Ragweed: <0.1 kU/L
D. farinae: 4.92 kU/L — ABNORMAL HIGH
Dog Dander: 36.3 kU/L — ABNORMAL HIGH
ELM IGE: 0.28 kU/L — AB
FESCUE: 2.7 kU/L — AB
G005 RYE, PERENNIAL: 2.6 kU/L — AB
G009 RED TOP: 3.32 kU/L — AB
Goldenrod: <0.1 kU/L
HOUSE DUST HOLLISTER: 8.93 kU/L — AB
Helminthosporium halodes: <0.1 kU/L
IgE (Immunoglobulin E), Serum: 225 kU/L — ABNORMAL HIGH (ref <115)
Lamb's Quarters: <0.1 kU/L
OAK CLASS: 4.8 kU/L — AB
Stemphylium Botryosum: <0.1 kU/L
Timothy Grass: 1.82 kU/L — ABNORMAL HIGH

## 2015-12-06 NOTE — Assessment & Plan Note (Signed)
Body mass index is 40.66   Lab Results  Component Value Date   TSH 1.38 03/02/2015     Contributing to gerd tendency/ doe/reviewed the need and the process to achieve and maintain neg calorie balance > defer f/u primary care including intermittently monitoring thyroid status

## 2015-12-06 NOTE — Assessment & Plan Note (Signed)
Spirometry 12/05/2015  FEV1  2.30 (90%) ratio 68  NO 12/05/2015 = 62  She clearly has mild but persistent asthma with the only symptom now a dry cough and some pseudowheeze on exam reflected in an abn f/v loop that does not suggest any sign airflow obst at all so rec max rx for UACS / gerd empirically and if breaking the rules of two's needs step up to dulera 100 or symbicort 80 immediately   I had an extended discussion with the patient reviewing all relevant studies completed to date and  lasting 90minutes of a 60 minute visit    Each maintenance medication was reviewed in detail including most importantly the difference between maintenance and prns and under what circumstances the prns are to be triggered using an action plan format that is not reflected in the computer generated alphabetically organized AVS.    Please see instructions for details which were reviewed in writing and the patient given a copy highlighting the part that I personally wrote and discussed at today's ov.

## 2015-12-06 NOTE — Telephone Encounter (Signed)
Notes Recorded by Tanda Rockers, MD on 12/06/2015 at 6:51 AM Call pt: Reviewed cxr and Agree she needs a lordotic view due to overlapping ribs making it hard to see the top of her lungs but this can wait until her f/u ov (or she can come in this week, either is fine as this is very very likely nothing of concern) ------------------- Spoke with pt, aware of results/recs.  States she does not have a rov scheduled at this time so would like to come in to have cxr repeated.  This has been ordered.  Nothing further needed at this time.

## 2015-12-06 NOTE — Progress Notes (Signed)
Quick Note:  LMTCB ______ 

## 2015-12-09 ENCOUNTER — Other Ambulatory Visit: Payer: Self-pay | Admitting: Obstetrics and Gynecology

## 2015-12-12 ENCOUNTER — Telehealth: Payer: Self-pay | Admitting: Internal Medicine

## 2015-12-12 NOTE — Telephone Encounter (Signed)
Notes Recorded by Tanda Rockers, MD on 12/11/2015 at 8:35 PM Call patient : Studies are c/w allergy to dog > grass/tree > cat ---------------------- lmtcb X1 for pt.

## 2015-12-12 NOTE — Telephone Encounter (Signed)
Spoke with the pt and notified of results  She is feeling much better and is asking if f/u here is needed  If not, is there something you rec other than avoidance to help with allergy symptoms if they arise  Please advise thanks

## 2015-12-12 NOTE — Telephone Encounter (Signed)
Already using zyrtec prn nasal allergies and if not well controlled then will need referral back to allergy/ Dr Derrel Nip can help with this  F/u is here prn refractory cough or breathing difficulties

## 2015-12-12 NOTE — Telephone Encounter (Signed)
LMTCB

## 2015-12-12 NOTE — Telephone Encounter (Signed)
Pt returning call and can be reach @ same #.Katelyn Wilson

## 2015-12-13 NOTE — Telephone Encounter (Signed)
Pt is aware of MW's recommendation. Nothing further was needed. 

## 2016-01-11 ENCOUNTER — Ambulatory Visit (INDEPENDENT_AMBULATORY_CARE_PROVIDER_SITE_OTHER): Payer: 59

## 2016-01-11 ENCOUNTER — Encounter: Payer: Self-pay | Admitting: Sports Medicine

## 2016-01-11 ENCOUNTER — Ambulatory Visit (INDEPENDENT_AMBULATORY_CARE_PROVIDER_SITE_OTHER): Payer: 59 | Admitting: Sports Medicine

## 2016-01-11 VITALS — BP 133/90 | HR 86 | Resp 12

## 2016-01-11 DIAGNOSIS — M779 Enthesopathy, unspecified: Secondary | ICD-10-CM | POA: Diagnosis not present

## 2016-01-11 DIAGNOSIS — M2142 Flat foot [pes planus] (acquired), left foot: Secondary | ICD-10-CM | POA: Diagnosis not present

## 2016-01-11 DIAGNOSIS — M21629 Bunionette of unspecified foot: Secondary | ICD-10-CM | POA: Diagnosis not present

## 2016-01-11 DIAGNOSIS — M2010 Hallux valgus (acquired), unspecified foot: Secondary | ICD-10-CM

## 2016-01-11 DIAGNOSIS — M2141 Flat foot [pes planus] (acquired), right foot: Secondary | ICD-10-CM

## 2016-01-11 DIAGNOSIS — E119 Type 2 diabetes mellitus without complications: Secondary | ICD-10-CM

## 2016-01-11 MED ORDER — MELOXICAM 15 MG PO TABS: 15.0000 mg | ORAL_TABLET | Freq: Every day | ORAL | Status: DC

## 2016-01-11 NOTE — Progress Notes (Signed)
Patient ID: Katelyn Wilson, female   DOB: 02-13-1976, 40 y.o.   MRN: [id number] Subjective: Katelyn Wilson is a 40 y.o. female patient who presents to office for evaluation of Right> Left 5th toe and bunion pain. Patient complains of ocassional pain over the bump worse with extension of 5th toe. Started about 2 weeks ago; possibly from boots that she was wearing;  Patient denies any other pedal complaints.   FBS not recorded. Not on meds  Patient Active Problem List   Diagnosis Date Noted  . Cough variant asthma 12/05/2015  . Asthma with exacerbation 11/12/2015  . Menometrorrhagia 08/07/2014  . Cellulitis and abscess of trunk 03/08/2014  . Severe obesity (BMI >= 40) ([place]) 10/23/2013  . Fibroid uterus 06/22/2013  . Contraceptive management 11/16/2011  . Diabetes mellitus type 2 in obese ([place]) 11/16/2011  . Anxiety   . High blood pressure 06/18/2011  . Family history of hypertrophic cardiomyopathy 06/18/2011   Current Outpatient Prescriptions on File Prior to Visit  Medication Sig Dispense Refill  . amLODipine (NORVASC) 5 MG tablet Take one-half tablet by  mouth daily 45 tablet 2  . cetirizine (ZYRTEC) 10 MG tablet Take 10 mg by mouth as needed.      . famotidine (PEPCID) 20 MG tablet One at bedtime 30 tablet 2  . guaiFENesin-codeine (CHERATUSSIN AC) 100-10 MG/5ML syrup Take 10 mLs by mouth 3 (three) times daily as needed for cough. 180 mL 0  . hydrochlorothiazide (MICROZIDE) 12.5 MG capsule Take 1 capsule by mouth  every morning 90 capsule 1  . mometasone (NASONEX) 50 MCG/ACT nasal spray Place 2 sprays into the nose daily. 17 g 6  . norethindrone-ethinyl estradiol (MICROGESTIN,JUNEL,LOESTRIN) 1-20 MG-MCG tablet Take 1 tablet by mouth  daily 63 tablet 4  . Olopatadine HCl (PATANASE) 0.6 % SOLN Use as directed 30.5 g 6  . pantoprazole (PROTONIX) 40 MG tablet Take 1 tablet (40 mg total) by mouth daily. Take 30-60 min before first meal of the day 30 tablet 2  . PROVENTIL HFA 108 (90 BASE)  MCG/ACT inhaler INHALE ONE PUFF BY MOUTH FOUR TIMES DAILY 6.7 Inhaler 1   No current facility-administered medications on file prior to visit.   Allergies  Allergen Reactions  . Penicillins     Amoxicillin     Objective:  General: Alert and oriented x3 in no acute distress  Dermatology: No open lesions bilateral lower extremities, no webspace macerations, no ecchymosis bilateral, all nails x 10 are well manicured.  Vascular: Dorsalis Pedis and Posterior Tibial pedal pulses 2/4, Capillary Fill Time 3 seconds, (+) pedal hair growth bilateral, no edema bilateral lower extremities, Temperature gradient within normal limits.  Neurology: Gross sensation intact via light touch bilateral, Protective sensation intact  with Semmes Weinstein Monofilament to all pedal sites, Position sense intact, vibratory intact bilateral, Deep tendon reflexes within normal limits bilateral, No babinski sign present bilateral. (-) Tinels sign.  Musculoskeletal: Mild tenderness with palpation right 5th MTPJ and extensor tendon complex; No pain to 1st and 5th bunions, no limitation or crepitus with range of motion, deformity reducible, tracking not trackbound, there is no 1st ray hypermobility noted bilateral. Midtarsal, Subtalar joint, and ankle joint range of motion is within normal limits. On weightbearing exam, there is decreased 1st MTPJ rom Right>Left with functional limitus noted, there is medial arch collapse Right> Left on weightbearing, rearfoot slight valgus, forefoot slight abduction with HAV deformity supported on ground with no second toe crossover deformity noted. Planus foot type bilateral.  Xrays  Right/Left Foot    Impression: Normal osseous mineralization, Intermetatarsal angle above normal limits at 1st-2nd and 4th-5th space, + dorsal spur at 1st met consistent with HAV and early tailors bunion, + hammertoe deformity, no fracture/dislocation, soft tissues within normal limits, no foreign body.         Assessment and Plan: Problem List Items Addressed This Visit    None    Visit Diagnoses    HAV (hallux abducto valgus), unspecified laterality    -  Primary    Relevant Orders    DG Foot 2 Views Left    DG Foot 2 Views Right    Tailor's bunion        Tendonitis        Relevant Medications    meloxicam (MOBIC) 15 MG tablet    Capsulitis        Relevant Medications    meloxicam (MOBIC) 15 MG tablet    Diabetes mellitus without complication (HCC)        Pes planus of both feet           -Complete examination performed -Xrays reviewed -Discussed treatement options; for likely 5th MTPJ capsulitis with extensor tendonitis secondary to tailors and HAV defomrity -After verbal consent, injected into right 5th MTPJ and extensor complex for symptomatic relief 1cc lidocaine plain, 1cc marcaine plain, 0.5cc kenalog and dexamethasone without complication.   -Rx Meloxicam -Recommend daily icing -Recommend continue with good supportive shoes and inserts; Will consider custom molded orthotics at next encounter based on symptoms  -Patient to return to office in 4 weeks or sooner if condition worsens.  Katelyn Wilson, DPM

## 2016-01-11 NOTE — Patient Instructions (Signed)

## 2016-01-11 NOTE — Progress Notes (Deleted)
   Subjective:    Patient ID: Katelyn Wilson, female    DOB: 1976/01/01, 40 y.o.   MRN: [id number]  HPI    Review of Systems  HENT: Positive for sinus pressure.        Objective:   Physical Exam        Assessment & Plan:

## 2016-02-08 ENCOUNTER — Ambulatory Visit: Payer: 59 | Admitting: Sports Medicine

## 2016-02-19 ENCOUNTER — Ambulatory Visit: Payer: 59 | Admitting: Sports Medicine

## 2016-03-07 ENCOUNTER — Encounter: Payer: Self-pay | Admitting: Sports Medicine

## 2016-03-07 ENCOUNTER — Ambulatory Visit (INDEPENDENT_AMBULATORY_CARE_PROVIDER_SITE_OTHER): Payer: 59 | Admitting: Sports Medicine

## 2016-03-07 DIAGNOSIS — M21629 Bunionette of unspecified foot: Secondary | ICD-10-CM

## 2016-03-07 DIAGNOSIS — M2141 Flat foot [pes planus] (acquired), right foot: Secondary | ICD-10-CM | POA: Diagnosis not present

## 2016-03-07 DIAGNOSIS — E119 Type 2 diabetes mellitus without complications: Secondary | ICD-10-CM | POA: Diagnosis not present

## 2016-03-07 DIAGNOSIS — M2010 Hallux valgus (acquired), unspecified foot: Secondary | ICD-10-CM | POA: Diagnosis not present

## 2016-03-07 DIAGNOSIS — M2142 Flat foot [pes planus] (acquired), left foot: Secondary | ICD-10-CM | POA: Diagnosis not present

## 2016-03-07 NOTE — Progress Notes (Signed)
Patient ID: Katelyn Wilson, female   DOB: 01/06/1976, 40 y.o.   MRN: [id number]  Subjective: Katelyn Wilson is a 40 y.o. diabetic female patient who returns to office for evaluation of Right> Left 5th toe and bunion pain. Patient states that after the anti-inflammatories and injection at right fifth toe and tendon area her pain and symptoms have resolved. States now she has pain over the top of her bunions, left today, greater than right. 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 Patient desires to discuss options for her bunion pain. Patient denies any other pedal complaints.   FBS not recorded. Not on meds  Patient Active Problem List   Diagnosis Date Noted  . Cough variant asthma 12/05/2015  . Asthma with exacerbation 11/12/2015  . Menometrorrhagia 08/07/2014  . Cellulitis and abscess of trunk 03/08/2014  . Severe obesity (BMI >= 40) ([place]) 10/23/2013  . Fibroid uterus 06/22/2013  . Contraceptive management 11/16/2011  . Diabetes mellitus type 2 in obese ([place]) 11/16/2011  . Anxiety   . High blood pressure 06/18/2011  . Family history of hypertrophic cardiomyopathy 06/18/2011   Current Outpatient Prescriptions on File Prior to Visit  Medication Sig Dispense Refill  . amLODipine (NORVASC) 5 MG tablet Take one-half tablet by  mouth daily 45 tablet 2  . cetirizine (ZYRTEC) 10 MG tablet Take 10 mg by mouth as needed.      . famotidine (PEPCID) 20 MG tablet One at bedtime 30 tablet 2  . guaiFENesin-codeine (CHERATUSSIN AC) 100-10 MG/5ML syrup Take 10 mLs by mouth 3 (three) times daily as needed for cough. 180 mL 0  . hydrochlorothiazide (MICROZIDE) 12.5 MG capsule Take 1 capsule by mouth  every morning 90 capsule 1  . meloxicam (MOBIC) 15 MG tablet Take 1 tablet (15 mg total) by mouth daily. 30 tablet 0  . mometasone (NASONEX) 50 MCG/ACT nasal spray Place 2 sprays into the nose daily. 17 g 6  . norethindrone-ethinyl estradiol (MICROGESTIN,JUNEL,LOESTRIN) 1-20 MG-MCG  tablet Take 1 tablet by mouth  daily 63 tablet 4  . Olopatadine HCl (PATANASE) 0.6 % SOLN Use as directed 30.5 g 6  . pantoprazole (PROTONIX) 40 MG tablet Take 1 tablet (40 mg total) by mouth daily. Take 30-60 min before first meal of the day 30 tablet 2  . PROVENTIL HFA 108 (90 BASE) MCG/ACT inhaler INHALE ONE PUFF BY MOUTH FOUR TIMES DAILY 6.7 Inhaler 1   No current facility-administered medications on file prior to visit.   Allergies  Allergen Reactions  . Penicillins     Amoxicillin     Objective:  General: Alert and oriented x3 in no acute distress  Dermatology: No open lesions bilateral lower extremities, no webspace macerations, no ecchymosis bilateral, all nails x 10 are well manicured.  Vascular: Dorsalis Pedis and Posterior Tibial pedal pulses 2/4, Capillary Fill Time 3 seconds, (+) pedal hair growth bilateral, no edema bilateral lower extremities, Temperature gradient within normal limits.  Neurology: Gross sensation intact via light touch bilateral, Protective sensation intact  with Semmes Weinstein Monofilament to all pedal sites, Position sense intact, vibratory intact bilateral, Deep tendon reflexes within normal limits bilateral, No babinski sign present bilateral. (-) Tinels sign.  Musculoskeletal: No tenderness with palpation right 5th MTPJ and extensor tendon complex; mild pain to 1st > 5th bunions, no limitation or crepitus with range of motion, deformity reducible, tracking not trackbound, there is no 1st ray hypermobility noted bilateral. Midtarsal, Subtalar joint, and ankle joint range of  motion is within normal limits. On weightbearing exam, there is decreased 1st MTPJ rom Right>Left with functional limitus noted, there is medial arch collapse Right> Left on weightbearing, rearfoot slight valgus, forefoot slight abduction with HAV deformity supported on ground with no second toe crossover deformity noted. Planus foot type bilateral.       Assessment and  Plan: Problem List Items Addressed This Visit    None    Visit Diagnoses    HAV (hallux abducto valgus), unspecified laterality    -  Primary    Tailor's bunion        Diabetes mellitus without complication (HCC)        Pes planus of both feet          -Complete examination performed -Discussed treatement options; Patient states that she would like to hold off on surgery for her bunions for now until after the first of the year -Recommended bunion shield gave to patient tube foam to use to areas as a shield -Advised patient to use good supportive and wider shoes to prevent irritation of her bunion sites -Recommend daily icing as needed -Recommend daily foot inspection in setting of diabetes and close monitoring -Patient to return to office as needed or sooner if condition worsens.  Katelyn Wilson, DPM

## 2016-06-09 ENCOUNTER — Ambulatory Visit: Payer: Self-pay | Admitting: Internal Medicine

## 2016-06-13 ENCOUNTER — Ambulatory Visit (INDEPENDENT_AMBULATORY_CARE_PROVIDER_SITE_OTHER): Payer: 59 | Admitting: Internal Medicine

## 2016-06-13 ENCOUNTER — Encounter: Payer: Self-pay | Admitting: Internal Medicine

## 2016-06-13 VITALS — BP 130/92 | HR 94 | Temp 98.1°F | Resp 12 | Ht 64.0 in | Wt 226.2 lb

## 2016-06-13 DIAGNOSIS — I10 Essential (primary) hypertension: Secondary | ICD-10-CM | POA: Diagnosis not present

## 2016-06-13 DIAGNOSIS — E669 Obesity, unspecified: Secondary | ICD-10-CM

## 2016-06-13 DIAGNOSIS — E1169 Type 2 diabetes mellitus with other specified complication: Secondary | ICD-10-CM

## 2016-06-13 DIAGNOSIS — R938 Abnormal findings on diagnostic imaging of other specified body structures: Secondary | ICD-10-CM

## 2016-06-13 DIAGNOSIS — R9389 Abnormal findings on diagnostic imaging of other specified body structures: Secondary | ICD-10-CM

## 2016-06-13 DIAGNOSIS — E119 Type 2 diabetes mellitus without complications: Secondary | ICD-10-CM | POA: Diagnosis not present

## 2016-06-13 LAB — HEMOGLOBIN A1C
Hgb A1c MFr Bld: 6.2 % — ABNORMAL HIGH (ref <5.7)
Mean Plasma Glucose: 131 mg/dL

## 2016-06-13 MED ORDER — HYDROCHLOROTHIAZIDE 12.5 MG PO CAPS: ORAL_CAPSULE | ORAL | Status: DC

## 2016-06-13 NOTE — Patient Instructions (Signed)
Diabetes and Exercise Exercising regularly is important. It is not just about losing weight. It has many health benefits, such as:  Improving your overall fitness, flexibility, and endurance.  Increasing your bone density.  Helping with weight control.  Decreasing your body fat.  Increasing your muscle strength.  Reducing stress and tension.  Improving your overall health. People with diabetes who exercise gain additional benefits because exercise:  Reduces appetite.  Improves the body's use of blood sugar (glucose).  Helps lower or control blood glucose.  Decreases blood pressure.  Helps control blood lipids (such as cholesterol and triglycerides).  Improves the body's use of the hormone insulin by:  Increasing the body's insulin sensitivity.  Reducing the body's insulin needs.  Decreases the risk for heart disease because exercising:  Lowers cholesterol and triglycerides levels.  Increases the levels of good cholesterol (such as high-density lipoproteins [HDL]) in the body.  Lowers blood glucose levels. YOUR ACTIVITY PLAN  Choose an activity that you enjoy, and set realistic goals. To exercise safely, you should begin practicing any new physical activity slowly, and gradually increase the intensity of the exercise over time. Your health care provider or diabetes educator can help create an activity plan that works for you. General recommendations include:  Encouraging children to engage in at least 60 minutes of physical activity each day.  Stretching and performing strength training exercises, such as yoga or weight lifting, at least 2 times per week.  Performing a total of at least 150 minutes of moderate-intensity exercise each week, such as brisk walking or water aerobics.  Exercising at least 3 days per week, making sure you allow no more than 2 consecutive days to pass without exercising.  Avoiding long periods of inactivity (90 minutes or more). When you  have to spend an extended period of time sitting down, take frequent breaks to walk or stretch. RECOMMENDATIONS FOR EXERCISING WITH TYPE 1 OR TYPE 2 DIABETES   Check your blood glucose before exercising. If blood glucose levels are greater than 240 mg/dL, check for urine ketones. Do not exercise if ketones are present.  Avoid injecting insulin into areas of the body that are going to be exercised. For example, avoid injecting insulin into:  The arms when playing tennis.  The legs when jogging.  Keep a record of:  Food intake before and after you exercise.  Expected peak times of insulin action.  Blood glucose levels before and after you exercise.  The type and amount of exercise you have done.  Review your records with your health care provider. Your health care provider will help you to develop guidelines for adjusting food intake and insulin amounts before and after exercising.  If you take insulin or oral hypoglycemic agents, watch for signs and symptoms of hypoglycemia. They include:  Dizziness.  Shaking.  Sweating.  Chills.  Confusion.  Drink plenty of water while you exercise to prevent dehydration or heat stroke. Body water is lost during exercise and must be replaced.  Talk to your health care provider before starting an exercise program to make sure it is safe for you. Remember, almost any type of activity is better than none.   This information is not intended to replace advice given to you by your health care provider. Make sure you discuss any questions you have with your health care provider.   Document Released: 02/14/2004 Document Revised: 04/10/2015 Document Reviewed: 05/03/2013 [employer] Interactive Patient Education 2016 [employer]  

## 2016-06-13 NOTE — Progress Notes (Signed)
Subjective:  Patient ID: Katelyn Wilson, female    DOB: [birth year]  Age: 40 y.o. MRN: [id number]  CC: The primary encounter diagnosis was Diabetes mellitus type 2 in obese ([place]). Diagnoses of Essential hypertension, Obesity, and Abnormal chest xray were also pertinent to this visit.  HPI Katelyn Wilson presents for follow up on diet controlled Type 2 DM  With obesity and hypertension. Her . Last a1c was Dec 2016.  Her last eye exam was  In August 2016  Dr Gloriann Loan.  He does no check her blood sugars and is following a carb reduced diet "some of the time." she has lost 11 lbs since her last visit intentionally.  Patient is taking her medications as prescribed and notes no adverse effects.  Home BP readings have been done about once per week and are  generally < 130/80 .  She is avoiding added salt in her diet and walking regularly about 3 times per week for exercise  .bp elevated today due to not taking meds today     Outpatient Prescriptions Prior to Visit  Medication Sig Dispense Refill  . amLODipine (NORVASC) 5 MG tablet Take one-half tablet by  mouth daily 45 tablet 2  . cetirizine (ZYRTEC) 10 MG tablet Take 10 mg by mouth as needed.      . famotidine (PEPCID) 20 MG tablet One at bedtime 30 tablet 2  . mometasone (NASONEX) 50 MCG/ACT nasal spray Place 2 sprays into the nose daily. 17 g 6  . Olopatadine HCl (PATANASE) 0.6 % SOLN Use as directed 30.5 g 6  . PROVENTIL HFA 108 (90 BASE) MCG/ACT inhaler INHALE ONE PUFF BY MOUTH FOUR TIMES DAILY 6.7 Inhaler 1  . hydrochlorothiazide (MICROZIDE) 12.5 MG capsule Take 1 capsule by mouth  every morning 90 capsule 1  . guaiFENesin-codeine (CHERATUSSIN AC) 100-10 MG/5ML syrup Take 10 mLs by mouth 3 (three) times daily as needed for cough. 180 mL 0  . meloxicam (MOBIC) 15 MG tablet Take 1 tablet (15 mg total) by mouth daily. 30 tablet 0  . norethindrone-ethinyl estradiol (MICROGESTIN,JUNEL,LOESTRIN) 1-20 MG-MCG tablet Take 1 tablet by mouth  daily 63 tablet 4   . pantoprazole (PROTONIX) 40 MG tablet Take 1 tablet (40 mg total) by mouth daily. Take 30-60 min before first meal of the day (Patient not taking: Reported on 06/13/2016) 30 tablet 2   No facility-administered medications prior to visit.    Review of Systems;  Patient denies headache, fevers, malaise, unintentional weight loss, skin rash, eye pain, sinus congestion and sinus pain, sore throat, dysphagia,  hemoptysis , cough, dyspnea, wheezing, chest pain, palpitations, orthopnea, edema, abdominal pain, nausea, melena, diarrhea, constipation, flank pain, dysuria, hematuria, urinary  Frequency, nocturia, numbness, tingling, seizures,  Focal weakness, Loss of consciousness,  Tremor, insomnia, depression, anxiety, and suicidal ideation.      Objective:  BP 130/92 mmHg  Pulse 94  Temp(Src) 98.1 F (36.7 C) (Oral)  Resp 12  Ht 5\' 4"  (1.626 m)  Wt 226 lb 4 oz (102.626 kg)  BMI 38.82 kg/m2  SpO2 99%  LMP 06/04/2016  BP Readings from Last 3 Encounters:  06/13/16 130/92  01/11/16 133/90  12/05/15 114/88    Wt Readings from Last 3 Encounters:  06/13/16 226 lb 4 oz (102.626 kg)  12/05/15 237 lb (107.502 kg)  11/23/15 238 lb 4.8 oz (108.092 kg)    General appearance: alert, cooperative and appears stated age Ears: normal TM's and external ear canals both ears Throat: lips, mucosa,  and tongue normal; teeth and gums normal Neck: no adenopathy, no carotid bruit, supple, symmetrical, trachea midline and thyroid not enlarged, symmetric, no tenderness/mass/nodules Back: symmetric, no curvature. ROM normal. No CVA tenderness. Lungs: clear to auscultation bilaterally Heart: regular rate and rhythm, S1, S2 normal, no murmur, click, rub or gallop Abdomen: soft, non-tender; bowel sounds normal; no masses,  no organomegaly Pulses: 2+ and symmetric Skin: Skin color, texture, turgor normal. No rashes or lesions Lymph nodes: Cervical, supraclavicular, and axillary nodes normal.  Lab Results    Component Value Date   HGBA1C 6.2* 06/13/2016   HGBA1C 6.5* 11/23/2015   HGBA1C 6.6* 05/08/2015    Lab Results  Component Value Date   CREATININE 0.64 06/13/2016   CREATININE 0.79 11/23/2015   CREATININE 0.7 05/08/2015    Lab Results  Component Value Date   WBC 4.9 12/05/2015   HGB 12.9 12/05/2015   HCT 40.1 12/05/2015   PLT 376.0 12/05/2015   GLUCOSE 93 06/13/2016   CHOL 165 06/13/2016   TRIG 77 06/13/2016   HDL 51 06/13/2016   LDLDIRECT 118 06/13/2016   LDLCALC 99 06/13/2016   ALT 9 06/13/2016   AST 12 06/13/2016   NA 135 06/13/2016   K 4.1 06/13/2016   CL 102 06/13/2016   CREATININE 0.64 06/13/2016   BUN 6* 06/13/2016   CO2 23 06/13/2016   TSH 1.38 03/02/2015   HGBA1C 6.2* 06/13/2016   MICROALBUR 1.1 06/13/2016    Dg Chest 2 View  12/05/2015  CLINICAL DATA:  Cough. EXAM: CHEST  2 VIEW COMPARISON:  None. FINDINGS: Mediastinum and hilar structures normal. Questionable ill-defined density right upper lobe. Repeat PA and lateral chest x-ray with apical lordotic view suggested. This density persists CT can be obtained. No pleural effusion pneumothorax. Heart size normal. Normal pulmonary vascularity. No acute bony abnormality . IMPRESSION: Questionable overlapping shadows versus parenchymal density right upper lobe. Repeat PA and lateral chest x-ray with apical lordotic chest x-ray suggested for further evaluation. If this density persists CT can be obtained. Electronically Signed   By: Marcello Moores  Register   On: 12/05/2015 13:04    Assessment & Plan:   Problem List Items Addressed This Visit    High blood pressure    Elevated today due to lapse in medications  Due to being lost to follow up,  meds refilled..   Lab Results  Component Value Date   CREATININE 0.64 06/13/2016   Lab Results  Component Value Date   NA 135 06/13/2016   K 4.1 06/13/2016   CL 102 06/13/2016   CO2 23 06/13/2016         Relevant Medications   hydrochlorothiazide (MICROZIDE) 12.5 MG  capsule   Other Relevant Orders   LDL cholesterol, direct (Completed)   Diabetes mellitus type 2 in obese ([place]) - Primary    Improving control with low GI diet and exercise .  hemoglobin A1c is now 6.2 . Patient is reminded to have her annual   eye exam and foot exam was done today.  There is  no proteinuria on prior micro urinalysis .  Fasting lipids are at goal by application of new ACC guidelines based on patient's 10 year risk of CAD being < 7%   Lab Results  Component Value Date   HGBA1C 6.2* 06/13/2016   Lab Results  Component Value Date   MICROALBUR 1.1 06/13/2016   Lab Results  Component Value Date   CHOL 165 06/13/2016   HDL 51 06/13/2016   LDLCALC 99  06/13/2016   LDLDIRECT 118 06/13/2016   TRIG 77 06/13/2016   CHOLHDL 3.2 06/13/2016             Relevant Orders   Comprehensive metabolic panel (Completed)   Lipid panel (Completed)   Hemoglobin A1c (Completed)   Microalbumin / creatinine urine ratio (Completed)   Obesity    I have congratulated her in reduction of   BMI to < 40 and encouraged  Continued weight loss with goal of 10% of body weigh over the next 6 months using a low glycemic index diet and regular exercise a minimum of 5 days per week.  Body mass index is 38.82 kg/(m^2).       Abnormal chest xray    An ill defined density in the RUL was noted on Dec 2017 chest xray and follow films were advised but not done.  patient will be reminded to have this done .         I have discontinued Ms. Hepp's guaiFENesin-codeine, pantoprazole, norethindrone-ethinyl estradiol, and meloxicam. I am also having her maintain her cetirizine, mometasone, Olopatadine HCl, amLODipine, PROVENTIL HFA, famotidine, and hydrochlorothiazide.  Meds ordered this encounter  Medications  . hydrochlorothiazide (MICROZIDE) 12.5 MG capsule    Sig: Take 1 capsule by mouth  every morning    Dispense:  90 capsule    Refill:  2    Medications Discontinued During This Encounter    Medication Reason  . guaiFENesin-codeine (CHERATUSSIN AC) 100-10 MG/5ML syrup Completed Course  . meloxicam (MOBIC) 15 MG tablet Completed Course  . norethindrone-ethinyl estradiol (MICROGESTIN,JUNEL,LOESTRIN) 1-20 MG-MCG tablet Patient Preference  . pantoprazole (PROTONIX) 40 MG tablet Patient Preference  . hydrochlorothiazide (MICROZIDE) 12.5 MG capsule Reorder    Follow-up: No Follow-up on file.   Crecencio Mc, MD

## 2016-06-13 NOTE — Progress Notes (Signed)
Pre-visit discussion using our clinic review tool. No additional management support is needed unless otherwise documented below in the visit note.  

## 2016-06-14 LAB — LIPID PANEL
CHOL/HDL RATIO: 3.2 ratio (ref <=5.0)
Cholesterol: 165 mg/dL (ref 125–200)
HDL: 51 mg/dL (ref >=46)
LDL CALC: 99 mg/dL (ref <130)
Triglycerides: 77 mg/dL (ref <150)
VLDL: 15 mg/dL (ref <30)

## 2016-06-14 LAB — MICROALBUMIN / CREATININE URINE RATIO
Creatinine, Urine: 70 mg/dL (ref 20–320)
MICROALB UR: 1.1 mg/dL
Microalb Creat Ratio: 16 mcg/mg creat (ref <30)

## 2016-06-14 LAB — COMPREHENSIVE METABOLIC PANEL
ALT: 9 U/L (ref 6–29)
AST: 12 U/L (ref 10–30)
Albumin: 3.9 g/dL (ref 3.6–5.1)
Alkaline Phosphatase: 62 U/L (ref 33–115)
BUN: 6 mg/dL — ABNORMAL LOW (ref 7–25)
CALCIUM: 9 mg/dL (ref 8.6–10.2)
CO2: 23 mmol/L (ref 20–31)
Chloride: 102 mmol/L (ref 98–110)
Creat: 0.64 mg/dL (ref 0.50–1.10)
GLUCOSE: 93 mg/dL (ref 65–99)
POTASSIUM: 4.1 mmol/L (ref 3.5–5.3)
Sodium: 135 mmol/L (ref 135–146)
Total Bilirubin: 0.5 mg/dL (ref 0.2–1.2)
Total Protein: 7.4 g/dL (ref 6.1–8.1)

## 2016-06-14 LAB — LDL CHOLESTEROL, DIRECT: LDL DIRECT: 118 mg/dL (ref <130)

## 2016-06-15 ENCOUNTER — Encounter: Payer: Self-pay | Admitting: Internal Medicine

## 2016-06-15 DIAGNOSIS — R9389 Abnormal findings on diagnostic imaging of other specified body structures: Secondary | ICD-10-CM | POA: Insufficient documentation

## 2016-06-15 NOTE — Assessment & Plan Note (Addendum)
I have congratulated her in reduction of   BMI to < 40 and encouraged  Continued weight loss with goal of 10% of body weigh over the next 6 months using a low glycemic index diet and regular exercise a minimum of 5 days per week.  Body mass index is 38.82 kg/(m^2).

## 2016-06-15 NOTE — Assessment & Plan Note (Signed)
Elevated today due to lapse in medications  Due to being lost to follow up,  meds refilled..   Lab Results  Component Value Date   CREATININE 0.64 06/13/2016   Lab Results  Component Value Date   NA 135 06/13/2016   K 4.1 06/13/2016   CL 102 06/13/2016   CO2 23 06/13/2016

## 2016-06-15 NOTE — Assessment & Plan Note (Signed)
Improving control with low GI diet and exercise .  hemoglobin A1c is now 6.2 . Patient is reminded to have her annual   eye exam and foot exam was done today.  There is  no proteinuria on prior micro urinalysis .  Fasting lipids are at goal by application of new ACC guidelines based on patient's 10 year risk of CAD being < 7%   Lab Results  Component Value Date   HGBA1C 6.2* 06/13/2016   Lab Results  Component Value Date   MICROALBUR 1.1 06/13/2016   Lab Results  Component Value Date   CHOL 165 06/13/2016   HDL 51 06/13/2016   LDLCALC 99 06/13/2016   LDLDIRECT 118 06/13/2016   TRIG 77 06/13/2016   CHOLHDL 3.2 06/13/2016

## 2016-06-15 NOTE — Assessment & Plan Note (Addendum)
An ill defined density in the RUL was noted on Dec 2017 chest xray ordewred by Pul,monology Dr Melvyn Novas.  follow films were advised but not done.  patient will be reminded to have this done .

## 2016-06-17 ENCOUNTER — Encounter: Payer: Self-pay | Admitting: Internal Medicine

## 2016-06-17 NOTE — Telephone Encounter (Signed)
Pt sent an email asking if she still needs the chest xray ordered at her December visit- she did not come in to get it when she was ordered.  Per pt, her PCP reminded her of this cxr.    MW please advise if pt still needs cxr.  Thanks!   Patient Instructions     Please remember to go to the lab and x-ray department downstairs for your tests - we will call you with the results when they are available.  Pantoprazole (protonix) 40 mg Take 30-60 min before first meal of the day and Pepcid (famotidine) 20 mg one @ bedtime X 4 weeks then return to this clinic if not back to normal. If better, ok to wean off these medication but play close attention to diet   GERD (REFLUX) is an extremely common cause of respiratory symptoms just like yours , many times with no obvious heartburn at all.   It can be treated with medication, but also with lifestyle changes including elevation of the head of your bed (ideally with 6 inch bed blocks), Smoking cessation, avoidance of late meals, excessive alcohol, and avoid fatty foods, chocolate, peppermint, colas, red wine, and acidic juices such as orange juice.  NO MINT OR MENTHOL PRODUCTS SO NO COUGH DROPS  USE SUGARLESS CANDY INSTEAD (Jolley ranchers or Stover's or Life Savers) or even ice chips will also do - the key is to swallow to prevent all throat clearing. NO OIL BASED VITAMINS - use powdered substitutes.

## 2016-06-18 ENCOUNTER — Ambulatory Visit (INDEPENDENT_AMBULATORY_CARE_PROVIDER_SITE_OTHER)
Admission: RE | Admit: 2016-06-18 | Discharge: 2016-06-18 | Disposition: A | Discharge status: Home / Self Care | Payer: 59 | Source: Ambulatory Visit | Attending: Internal Medicine | Admitting: Internal Medicine

## 2016-06-18 DIAGNOSIS — J45991 Cough variant asthma: Secondary | ICD-10-CM

## 2016-06-18 DIAGNOSIS — R9389 Abnormal findings on diagnostic imaging of other specified body structures: Secondary | ICD-10-CM

## 2016-06-18 DIAGNOSIS — R918 Other nonspecific abnormal finding of lung field: Secondary | ICD-10-CM | POA: Diagnosis not present

## 2016-06-19 ENCOUNTER — Telehealth: Payer: Self-pay | Admitting: Internal Medicine

## 2016-06-19 NOTE — Telephone Encounter (Signed)
Call pt: Reviewed cxr and is nl so nothing further needed  LMTCB

## 2016-06-19 NOTE — Telephone Encounter (Signed)
[phone] pt calling back

## 2016-06-19 NOTE — Progress Notes (Signed)
Quick Note:  LMTCB ______ 

## 2016-06-19 NOTE — Telephone Encounter (Signed)
Called and spoke with pt and she is aware of results.   

## 2016-06-19 NOTE — Telephone Encounter (Signed)
Called and lmomtcb for the pt.

## 2016-06-30 ENCOUNTER — Encounter: Payer: Self-pay | Admitting: Internal Medicine

## 2016-07-14 ENCOUNTER — Encounter: Payer: Self-pay | Admitting: Internal Medicine

## 2016-07-15 ENCOUNTER — Other Ambulatory Visit: Payer: Self-pay | Admitting: Internal Medicine

## 2016-08-18 ENCOUNTER — Ambulatory Visit (INDEPENDENT_AMBULATORY_CARE_PROVIDER_SITE_OTHER): Payer: 59 | Admitting: Physician Assistant

## 2016-08-18 ENCOUNTER — Encounter: Payer: Self-pay | Admitting: Physician Assistant

## 2016-08-18 VITALS — BP 124/80 | HR 99 | Temp 98.5°F | Resp 17 | Ht 66.5 in | Wt 222.0 lb

## 2016-08-18 DIAGNOSIS — J029 Acute pharyngitis, unspecified: Secondary | ICD-10-CM

## 2016-08-18 LAB — POCT RAPID STREP A (OFFICE): Rapid Strep A Screen: NEGATIVE

## 2016-08-18 NOTE — Progress Notes (Signed)
Patient ID: Katelyn Wilson, female     DOB: 05/06/1976, 40 y.o.    MRN: [id number]  PCP: Katelyn Mc, MD  Chief Complaint  Patient presents with  . Sore Throat    Subjective:    HPI  Presents for evaluation of sore throat beginning upon waking up this morning.  Yesterday she had some headache, some upset stomach, mild cough and generalized malaise. Those symptoms have all resolved today.  No fever, chills. No increased nasal congestion or drainage above her typical allergy symptoms. No urinary symptoms. No diarrhea/constipation.  She has been exposed to a colleague with similar symptoms, but not to any known strep throat contacts.  Prior to Admission medications   Medication Sig Start Date End Date Taking? Authorizing Provider  amLODipine (NORVASC) 5 MG tablet Take one-half tablet by  mouth daily 07/16/16  Yes Katelyn Mc, MD  cetirizine (ZYRTEC) 10 MG tablet Take 10 mg by mouth as needed.     Yes Historical Provider, MD  hydrochlorothiazide (MICROZIDE) 12.5 MG capsule Take 1 capsule by mouth  every morning 06/13/16  Yes Katelyn Mc, MD  PROVENTIL HFA 108 (90 BASE) MCG/ACT inhaler INHALE ONE PUFF BY MOUTH FOUR TIMES DAILY 10/25/15  Yes Katelyn Confer, MD     Allergies  Allergen Reactions  . Penicillins     Amoxicillin     Patient Active Problem List   Diagnosis Date Noted  . Cough variant asthma 12/05/2015  . Asthma with exacerbation 11/12/2015  . Menometrorrhagia 08/07/2014  . Obesity 10/23/2013  . Fibroid uterus 06/22/2013  . Contraceptive management 11/16/2011  . Diabetes mellitus type 2 in obese ([place]) 11/16/2011  . Anxiety   . High blood pressure 06/18/2011  . Family history of hypertrophic cardiomyopathy 06/18/2011     Family History  Problem Relation Age of Onset  . Heart disease Paternal Aunt 37    sudden death  . Heart disease Father     sudden cardiac death  . Diabetes Father   . Diabetes    . Lung cancer    . Cancer    . Diabetes  Mother   . Cancer Maternal Grandmother   . Cancer Maternal Grandfather      Social History   Social History  . Marital status: Single    Spouse name: N/A  . Number of children: 0  . Years of education: N/A   Occupational History  . project Environmental education officer   Social History Main Topics  . Smoking status: Never Smoker  . Smokeless tobacco: Never Used  . Alcohol use 2.5 oz/week    5 drink(s) per week     Comment: occasional  . Drug use: No  . Sexual activity: Yes   Other Topics Concern  . Not on file   Social History Narrative  . No narrative on file        Review of Systems As above.      Objective:  Physical Exam  Constitutional: She is oriented to person, place, and time. She appears well-developed and well-nourished. She is active and cooperative. No distress.  BP 124/80 (BP Location: Right Arm, Patient Position: Sitting, Cuff Size: Normal)   Pulse 99   Temp 98.5 F (36.9 C) (Oral)   Resp 17   Ht 5' 6.5" (1.689 m)   Wt 222 lb (100.7 kg)   LMP 07/21/2016 (Approximate)   SpO2 99%   BMI 35.29 kg/m   HENT:  Head: Normocephalic and atraumatic.  Right Ear: Hearing, tympanic membrane and external ear normal. Right ear foreign body: cerumen obscurs visualization of the TM.  Left Ear: Hearing, tympanic membrane, external ear and ear canal normal.  Nose: Nose normal.  Mouth/Throat: Mucous membranes are normal. Normal dentition. No uvula swelling. Posterior oropharyngeal erythema present. No oropharyngeal exudate, posterior oropharyngeal edema or tonsillar abscesses.    Eyes: Conjunctivae are normal. No scleral icterus.  Neck: Normal range of motion. Neck supple. No thyromegaly present.  Cardiovascular: Normal rate, regular rhythm and normal heart sounds.   Pulses:      Radial pulses are 2+ on the right side, and 2+ on the left side.  Pulmonary/Chest: Effort normal and breath sounds normal.  Abdominal: Soft. Bowel sounds are normal. There is no  hepatosplenomegaly. There is no tenderness.  Lymphadenopathy:       Head (right side): No submandibular, no tonsillar, no preauricular, no posterior auricular and no occipital adenopathy present.       Head (left side): No submandibular, no tonsillar, no preauricular, no posterior auricular and no occipital adenopathy present.    She has no cervical adenopathy.       Right: No supraclavicular adenopathy present.       Left: No supraclavicular adenopathy present.  Neurological: She is alert and oriented to person, place, and time. No sensory deficit.  Skin: Skin is warm, dry and intact. No rash noted. No cyanosis or erythema. Nails show no clubbing.  Psychiatric: She has a normal mood and affect. Her speech is normal and behavior is normal.     Results for orders placed or performed in visit on 08/18/16  POCT rapid strep A  Result Value Ref Range   Rapid Strep A Screen Negative Negative           Assessment & Plan:  1. Sore throat Likely viral pharyngitis. Supportive care and anticipatory guidance. Monitor for new symptoms and RTC if symptoms worsen/persist. - POCT rapid strep A - Culture, Group A Strep   Katelyn Chute, PA-C Physician Assistant-Certified [facility]

## 2016-08-18 NOTE — Progress Notes (Signed)
   Subjective:    Patient ID: Katelyn Wilson, female    DOB: 06/20/1976, 40 y.o.   MRN: [id number]  Chief Complaint  Patient presents with  . Sore Throat   HPI Patient is a 69 AA female w a hx of HTN, well controlled on medication, who presents today with a sore throat x 3 hours. The patient reports sore throat started early this morning upon waking. The pain is sharp, consistent and is relieved by warm liquids. The day before she reports slight abdominal pain and dry cough which has cleared. Patient admits to dysphagia, sneezing, rhinorrhea, HA, and sick contacts. Denies vision changes, eye discharge, hearing changes, lymphadenopathy, and rash. Review of Systems All review of systems were negative except for ones stated above     Objective:   Physical Exam  Constitutional: She appears well-developed and well-nourished. No distress.  HENT:  Head: Normocephalic and atraumatic.  Right Ear: External ear normal.  Left Ear: External ear normal.  Mouth/Throat: Mucous membranes are normal. Oropharyngeal exudate (Erythema present) present.    Neck: Neck supple.  Cardiovascular: Normal rate, regular rhythm and intact distal pulses.  Exam reveals no gallop and no friction rub.   No murmur heard. Pulmonary/Chest: Effort normal and breath sounds normal. No respiratory distress. She has no wheezes.  Abdominal: Soft. She exhibits no distension.  Skin: Skin is warm and dry.   Results for orders placed or performed in visit on 08/18/16  POCT rapid strep A  Result Value Ref Range   Rapid Strep A Screen Negative Negative       Assessment & Plan:  Acute Viral Pharyngitis  Supportive care   Dink plenty fluid  Get 6-8 of sleep  Keep regular eating habits

## 2016-08-18 NOTE — Patient Instructions (Addendum)
Get plenty of rest and drink at least 64 ounces of water daily.     IF you received an x-ray today, you will receive an invoice from [facility]. Please contact  Radiology at [phone] with questions or concerns regarding your invoice.   IF you received labwork today, you will receive an invoice from [facility]. Please contact [facility] at [phone] with questions or concerns regarding your invoice.   Our billing staff will not be able to assist you with questions regarding bills from these companies.  You will be contacted with the lab results as soon as they are available. The fastest way to get your results is to activate your My Chart account. Instructions are located on the last page of this paperwork. If you have not heard from us regarding the results in 2 weeks, please contact this office.     

## 2016-08-21 LAB — CULTURE, GROUP A STREP: Organism ID, Bacteria: NORMAL

## 2016-09-02 DIAGNOSIS — H52213 Irregular astigmatism, bilateral: Secondary | ICD-10-CM | POA: Diagnosis not present

## 2016-09-05 ENCOUNTER — Encounter (INDEPENDENT_AMBULATORY_CARE_PROVIDER_SITE_OTHER): Payer: 59 | Admitting: Ophthalmology

## 2016-09-10 ENCOUNTER — Encounter (INDEPENDENT_AMBULATORY_CARE_PROVIDER_SITE_OTHER): Payer: 59 | Admitting: Ophthalmology

## 2016-09-10 DIAGNOSIS — H35033 Hypertensive retinopathy, bilateral: Secondary | ICD-10-CM | POA: Diagnosis not present

## 2016-09-10 DIAGNOSIS — H43813 Vitreous degeneration, bilateral: Secondary | ICD-10-CM

## 2016-09-10 DIAGNOSIS — I10 Essential (primary) hypertension: Secondary | ICD-10-CM | POA: Diagnosis not present

## 2016-09-10 DIAGNOSIS — H33303 Unspecified retinal break, bilateral: Secondary | ICD-10-CM | POA: Diagnosis not present

## 2016-09-10 DIAGNOSIS — H35413 Lattice degeneration of retina, bilateral: Secondary | ICD-10-CM | POA: Diagnosis not present

## 2016-09-17 ENCOUNTER — Encounter (INDEPENDENT_AMBULATORY_CARE_PROVIDER_SITE_OTHER): Payer: 59 | Admitting: Ophthalmology

## 2016-09-17 DIAGNOSIS — H33301 Unspecified retinal break, right eye: Secondary | ICD-10-CM | POA: Diagnosis not present

## 2016-09-22 ENCOUNTER — Ambulatory Visit: Payer: 59 | Admitting: Internal Medicine

## 2016-09-29 ENCOUNTER — Ambulatory Visit (INDEPENDENT_AMBULATORY_CARE_PROVIDER_SITE_OTHER): Payer: 59 | Admitting: Ophthalmology

## 2016-09-29 DIAGNOSIS — H33303 Unspecified retinal break, bilateral: Secondary | ICD-10-CM

## 2016-10-04 ENCOUNTER — Encounter: Payer: Self-pay | Admitting: Internal Medicine

## 2016-10-06 ENCOUNTER — Other Ambulatory Visit: Payer: Self-pay | Admitting: Internal Medicine

## 2016-10-06 ENCOUNTER — Telehealth: Payer: 59 | Admitting: Family

## 2016-10-06 DIAGNOSIS — I10 Essential (primary) hypertension: Secondary | ICD-10-CM

## 2016-10-06 DIAGNOSIS — B9689 Other specified bacterial agents as the cause of diseases classified elsewhere: Secondary | ICD-10-CM

## 2016-10-06 DIAGNOSIS — J028 Acute pharyngitis due to other specified organisms: Secondary | ICD-10-CM

## 2016-10-06 DIAGNOSIS — E669 Obesity, unspecified: Secondary | ICD-10-CM

## 2016-10-06 DIAGNOSIS — E1169 Type 2 diabetes mellitus with other specified complication: Secondary | ICD-10-CM

## 2016-10-06 MED ORDER — AZITHROMYCIN 250 MG PO TABS: ORAL_TABLET | ORAL | 0 refills | Status: DC

## 2016-10-06 MED ORDER — HYDROCHLOROTHIAZIDE 25 MG PO TABS: 25.0000 mg | ORAL_TABLET | Freq: Every day | ORAL | 3 refills | Status: DC

## 2016-10-06 MED ORDER — ALBUTEROL SULFATE HFA 108 (90 BASE) MCG/ACT IN AERS: 2.0000 | INHALATION_SPRAY | Freq: Four times a day (QID) | RESPIRATORY_TRACT | 0 refills | Status: DC | PRN

## 2016-10-06 MED ORDER — BENZONATATE 100 MG PO CAPS: 100.0000 mg | ORAL_CAPSULE | Freq: Three times a day (TID) | ORAL | 0 refills | Status: DC | PRN

## 2016-10-06 NOTE — Progress Notes (Signed)
We are sorry that you are not feeling well.  Here is how we plan to help!  Based on what you have shared with me it looks like you have upper respiratory tract inflammation that has resulted in a significant cough.  Inflammation and infection in the upper respiratory tract is commonly called bronchitis and has four common causes:  Allergies, Viral Infections, Acid Reflux and Bacterial Infections.  Allergies, viruses and acid reflux are treated by controlling symptoms or eliminating the cause. An example might be a cough caused by taking certain blood pressure medications. You stop the cough by changing the medication. Another example might be a cough caused by acid reflux. Controlling the reflux helps control the cough.  Based on your presentation I believe you most likely have A cough due to bacteria.  When patients have a fever and a productive cough with a change in color or increased sputum production, we are concerned about bacterial bronchitis.  If left untreated it can progress to pneumonia.  If your symptoms do not improve with your treatment plan it is important that you contact your provider.   I have prescribed Azithromyin 250 mg: two tables now and then one tablet daily for 4 additonal days    Because you mentioned others around you with similar symptoms and the mucous greater than one week, there is a chance this is a bacterial infection that is exacerbated by your allergies as well as your asthma. Please continue to use the inhaler you have on file on your chart (I will order a refill with the meds above).   In addition you may use A non-prescription cough medication called Mucinex DM: take 2 tablets every 12 hours. and A prescription cough medication called Tessalon Perles 100mg . You may take 1-2 capsules every 8 hours as needed for your cough.    USE OF BRONCHODILATOR ("RESCUE") INHALERS: There is a risk from using your bronchodilator too frequently.  The risk is that over-reliance on a  medication which only relaxes the muscles surrounding the breathing tubes can reduce the effectiveness of medications prescribed to reduce swelling and congestion of the tubes themselves.  Although you feel brief relief from the bronchodilator inhaler, your asthma may actually be worsening with the tubes becoming more swollen and filled with mucus.  This can delay other crucial treatments, such as oral steroid medications. If you need to use a bronchodilator inhaler daily, several times per day, you should discuss this with your provider.  There are probably better treatments that could be used to keep your asthma under control.     HOME CARE . Only take medications as instructed by your medical team. . Complete the entire course of an antibiotic. . Drink plenty of fluids and get plenty of rest. . Avoid close contacts especially the very young and the elderly . Cover your mouth if you cough or cough into your sleeve. . Always remember to wash your hands . A steam or ultrasonic humidifier can help congestion.   GET HELP RIGHT AWAY IF: . You develop worsening fever. . You become short of breath . You cough up blood. . Your symptoms persist after you have completed your treatment plan MAKE SURE YOU   Understand these instructions.  Will watch your condition.  Will get help right away if you are not doing well or get worse.  Your e-visit answers were reviewed by a board certified advanced clinical practitioner to complete your personal care plan.  Depending on the condition,  your plan could have included both over the counter or prescription medications. If there is a problem please reply  once you have received a response from your provider. Your safety is important to [place].  If you have drug allergies check your prescription carefully.    You can use MyChart to ask questions about today's visit, request a non-urgent call back, or ask for a work or school excuse for 24 hours related to this  e-Visit. If it has been greater than 24 hours you will need to follow up with your provider, or enter a new e-Visit to address those concerns. You will get an e-mail in the next two days asking about your experience.  I hope that your e-visit has been valuable and will speed your recovery. Thank you for using e-visits.

## 2016-10-14 ENCOUNTER — Encounter: Payer: Self-pay | Admitting: Internal Medicine

## 2016-10-14 ENCOUNTER — Ambulatory Visit (INDEPENDENT_AMBULATORY_CARE_PROVIDER_SITE_OTHER): Payer: 59 | Admitting: Internal Medicine

## 2016-10-14 DIAGNOSIS — E669 Obesity, unspecified: Secondary | ICD-10-CM | POA: Diagnosis not present

## 2016-10-14 DIAGNOSIS — Z6832 Body mass index (BMI) 32.0-32.9, adult: Secondary | ICD-10-CM

## 2016-10-14 DIAGNOSIS — M25421 Effusion, right elbow: Secondary | ICD-10-CM

## 2016-10-14 DIAGNOSIS — I10 Essential (primary) hypertension: Secondary | ICD-10-CM | POA: Diagnosis not present

## 2016-10-14 NOTE — Progress Notes (Signed)
Pre-visit discussion using our clinic review tool. No additional management support is needed unless otherwise documented below in the visit note.  

## 2016-10-14 NOTE — Progress Notes (Signed)
Subjective:  Patient ID: Katelyn Wilson, female    DOB: 12-15-1975  Age: 40 y.o. MRN: [id number]  CC: Diagnoses of Essential hypertension, Class 1 obesity without serious comorbidity with body mass index (BMI) of 32.0 to 32.9 in adult, unspecified obesity type, and Effusion of olecranon bursa, right were pertinent to this visit.  HPI Katelyn Wilson presents for follow up on multiple issues.    1) she has developed a Right elbow effusion after striking it on a corner .  She denies pain but has developed some discomfort I the lateral forearm when trying to open bottles and jars.   2) Hypertension:she has not taken hctz  In a few days and her BP is mormal.   3) Obesity:  She Has been losing weight through diet and exercise  A total of 22.4 lbs since June 2016  . Has been being more active  At work,  But not exercising daily .    First goal < 200 lbs .  Eventual /ultimate  goal is 190 lbs for BMI < 30   Discussed referral to Dr Leafy Ro and to Orthopedics    Outpatient Medications Prior to Visit  Medication Sig Dispense Refill  . albuterol (PROVENTIL HFA;VENTOLIN HFA) 108 (90 Base) MCG/ACT inhaler Inhale 2 puffs into the lungs every 6 (six) hours as needed for wheezing or shortness of breath. 1 Inhaler 0  . amLODipine (NORVASC) 5 MG tablet Take one-half tablet by  mouth daily 45 tablet 3  . cetirizine (ZYRTEC) 10 MG tablet Take 10 mg by mouth as needed.      . hydrochlorothiazide (HYDRODIURIL) 25 MG tablet Take 1 tablet (25 mg total) by mouth daily. 90 tablet 3  . azithromycin (ZITHROMAX) 250 MG tablet Take 2 tabs now then 1 daily times 4 days 6 tablet 0  . benzonatate (TESSALON PERLES) 100 MG capsule Take 1-2 capsules (100-200 mg total) by mouth every 8 (eight) hours as needed for cough. 30 capsule 0  . hydrochlorothiazide (MICROZIDE) 12.5 MG capsule Take 1 capsule by mouth  every morning 90 capsule 2  . PROVENTIL HFA 108 (90 BASE) MCG/ACT inhaler INHALE ONE PUFF BY MOUTH FOUR TIMES DAILY  6.7 Inhaler 1   No facility-administered medications prior to visit.     Review of Systems;  Patient denies headache, fevers, malaise, unintentional weight loss, skin rash, eye pain, sinus congestion and sinus pain, sore throat, dysphagia,  hemoptysis , cough, dyspnea, wheezing, chest pain, palpitations, orthopnea, edema, abdominal pain, nausea, melena, diarrhea, constipation, flank pain, dysuria, hematuria, urinary  Frequency, nocturia, numbness, tingling, seizures,  Focal weakness, Loss of consciousness,  Tremor, insomnia, depression, anxiety, and suicidal ideation.      Objective:  BP 102/78   Pulse 79   Temp 98.3 F (36.8 C) (Oral)   Resp 12   Ht 5\' 7"  (1.702 m)   Wt 222 lb 12 oz (101 kg)   LMP 10/13/2016   SpO2 99%   BMI 34.89 kg/m   BP Readings from Last 3 Encounters:  10/14/16 102/78  08/18/16 124/80  06/13/16 (!) 130/92    Wt Readings from Last 3 Encounters:  10/14/16 222 lb 12 oz (101 kg)  08/18/16 222 lb (100.7 kg)  06/13/16 226 lb 4 oz (102.6 kg)    General appearance: alert, cooperative and appears stated age Back: symmetric, no curvature. ROM normal. No CVA tenderness. Lungs: clear to auscultation bilaterally Heart: regular rate and rhythm, S1, S2 normal, no murmur, click, rub or gallop  Abdomen: soft, non-tender; bowel sounds normal; no masses,  no organomegaly: right elbow effusion ,  Pulses: 2+ and symmetric Skin: Skin color, texture, turgor normal. No rashes or lesions Lymph nodes: Cervical, supraclavicular, and axillary nodes normal.  Lab Results  Component Value Date   HGBA1C 6.2 (H) 06/13/2016   HGBA1C 6.5 (H) 11/23/2015   HGBA1C 6.6 (A) 05/08/2015    Lab Results  Component Value Date   CREATININE 0.64 06/13/2016   CREATININE 0.79 11/23/2015   CREATININE 0.7 05/08/2015    Lab Results  Component Value Date   WBC 4.9 12/05/2015   HGB 12.9 12/05/2015   HCT 40.1 12/05/2015   PLT 376.0 12/05/2015   GLUCOSE 93 06/13/2016   CHOL 165  06/13/2016   TRIG 77 06/13/2016   HDL 51 06/13/2016   LDLDIRECT 118 06/13/2016   LDLCALC 99 06/13/2016   ALT 9 06/13/2016   AST 12 06/13/2016   NA 135 06/13/2016   K 4.1 06/13/2016   CL 102 06/13/2016   CREATININE 0.64 06/13/2016   BUN 6 (L) 06/13/2016   CO2 23 06/13/2016   TSH 1.38 03/02/2015   HGBA1C 6.2 (H) 06/13/2016   MICROALBUR 1.1 06/13/2016    Dg Chest Lordotic Only  Result Date: 06/19/2016 CLINICAL DATA:  Follow-up abnormal chest x-ray EXAM: CHEST SPECIAL VIEW COMPARISON:  12/03/2015 FINDINGS: Single frontal lordotic view of the chest submitted. No infiltrate or pulmonary edema. No definite nodule is noted in right upper lobe. Suboptimal study due to patient's large body habitus. IMPRESSION: No active disease. No definite nodule is identified in right upper lobe. Electronically Signed   By: Lahoma Crocker M.D.   On: 06/19/2016 07:59    Assessment & Plan:   Problem List Items Addressed This Visit    High blood pressure    Improved with weight loss approaching 10% of body weight.  hctz suspended,continue amlodipine 5 mg daily       Obesity    I have congratulated her in reduction of   BMI and encouraged  Continued weight loss with goal of 10% of body weight over the next 6 months using a low glycemic index diet and regular exercise a minimum of 5 days per week.        Effusion of olecranon bursa, right    Traumatic,  Recommended referral to orthopedics for aspiration        A total of 25 minutes of face to face time was spent with patient more than half of which was spent in counselling about the above mentioned conditions  and coordination of care   I have discontinued Ms. Mott's PROVENTIL HFA, azithromycin, and benzonatate. I am also having her maintain her cetirizine, amLODipine, albuterol, and hydrochlorothiazide.  No orders of the defined types were placed in this encounter.   Medications Discontinued During This Encounter  Medication Reason  . azithromycin  (ZITHROMAX) 250 MG tablet Completed Course  . PROVENTIL HFA 108 (90 BASE) MCG/ACT inhaler Change in therapy  . hydrochlorothiazide (MICROZIDE) 12.5 MG capsule Change in therapy  . benzonatate (TESSALON PERLES) 100 MG capsule Change in therapy    Follow-up: Return in about 6 months (around 04/13/2017), or labs in january .   Crecencio Mc, MD

## 2016-10-14 NOTE — Patient Instructions (Addendum)
I AM SO PROUD OF YOU!!!!!  Your weight loss has improved your blood pressure so you no longer need to be both medications. You can save the HCTZ for fays you have fluid retention, and just continue amlodipine daily  If you start to plateau,  Start exercising.    Your goal to get your BMI<30 is 10  Lbs   See you in 6 months,  Sooner if you need help!

## 2016-10-15 DIAGNOSIS — M25421 Effusion, right elbow: Secondary | ICD-10-CM | POA: Insufficient documentation

## 2016-10-15 NOTE — Assessment & Plan Note (Addendum)
Improved with weight loss approaching 10% of body weight.  hctz suspended,continue amlodipine 5 mg daily

## 2016-10-15 NOTE — Assessment & Plan Note (Signed)
Traumatic,  Recommended referral to orthopedics for aspiration

## 2016-10-15 NOTE — Assessment & Plan Note (Addendum)
I have congratulated her in reduction of   BMI and encouraged  Continued weight loss with goal of 10% of body weight over the next 6 months using a low glycemic index diet and regular exercise a minimum of 5 days per week.     

## 2016-10-20 DIAGNOSIS — M7021 Olecranon bursitis, right elbow: Secondary | ICD-10-CM | POA: Diagnosis not present

## 2016-11-10 ENCOUNTER — Encounter: Payer: Self-pay | Admitting: Internal Medicine

## 2016-11-10 ENCOUNTER — Other Ambulatory Visit: Payer: Self-pay

## 2016-11-10 ENCOUNTER — Other Ambulatory Visit: Payer: Self-pay | Admitting: Family

## 2016-11-10 DIAGNOSIS — J028 Acute pharyngitis due to other specified organisms: Principal | ICD-10-CM

## 2016-11-10 DIAGNOSIS — B9689 Other specified bacterial agents as the cause of diseases classified elsewhere: Secondary | ICD-10-CM

## 2016-11-10 MED ORDER — ALBUTEROL SULFATE HFA 108 (90 BASE) MCG/ACT IN AERS: 2.0000 | INHALATION_SPRAY | Freq: Four times a day (QID) | RESPIRATORY_TRACT | 2 refills | Status: DC | PRN

## 2016-11-25 ENCOUNTER — Ambulatory Visit (INDEPENDENT_AMBULATORY_CARE_PROVIDER_SITE_OTHER): Payer: 59 | Admitting: Obstetrics and Gynecology

## 2016-11-25 ENCOUNTER — Other Ambulatory Visit: Payer: Self-pay | Admitting: Obstetrics and Gynecology

## 2016-11-25 ENCOUNTER — Encounter: Payer: Self-pay | Admitting: Obstetrics and Gynecology

## 2016-11-25 VITALS — BP 112/83 | HR 88 | Ht 64.0 in | Wt 221.9 lb

## 2016-11-25 DIAGNOSIS — Z01419 Encounter for gynecological examination (general) (routine) without abnormal findings: Secondary | ICD-10-CM

## 2016-11-25 NOTE — Patient Instructions (Signed)
 Preventive Care 18-39 Years, Female Preventive care refers to lifestyle choices and visits with your health care provider that can promote health and wellness. What does preventive care include?  A yearly physical exam. This is also called an annual well check.  Dental exams once or twice a year.  Routine eye exams. Ask your health care provider how often you should have your eyes checked.  Personal lifestyle choices, including:  Daily care of your teeth and gums.  Regular physical activity.  Eating a healthy diet.  Avoiding tobacco and drug use.  Limiting alcohol use.  Practicing safe sex.  Taking vitamin and mineral supplements as recommended by your health care provider. What happens during an annual well check? The services and screenings done by your health care provider during your annual well check will depend on your age, overall health, lifestyle risk factors, and family history of disease. Counseling  Your health care provider may ask you questions about your:  Alcohol use.  Tobacco use.  Drug use.  Emotional well-being.  Home and relationship well-being.  Sexual activity.  Eating habits.  Work and work environment.  Method of birth control.  Menstrual cycle.  Pregnancy history. Screening  You may have the following tests or measurements:  Height, weight, and BMI.  Diabetes screening. This is done by checking your blood sugar (glucose) after you have not eaten for a while (fasting).  Blood pressure.  Lipid and cholesterol levels. These may be checked every 5 years starting at age 20.  Skin check.  Hepatitis C blood test.  Hepatitis B blood test.  Sexually transmitted disease (STD) testing.  BRCA-related cancer screening. This may be done if you have a family history of breast, ovarian, tubal, or peritoneal cancers.  Pelvic exam and Pap test. This may be done every 3 years starting at age 21. Starting at age 30, this may be done  every 5 years if you have a Pap test in combination with an HPV test. Discuss your test results, treatment options, and if necessary, the need for more tests with your health care provider. Vaccines  Your health care provider may recommend certain vaccines, such as:  Influenza vaccine. This is recommended every year.  Tetanus, diphtheria, and acellular pertussis (Tdap, Td) vaccine. You may need a Td booster every 10 years.  Varicella vaccine. You may need this if you have not been vaccinated.  HPV vaccine. If you are 26 or younger, you may need three doses over 6 months.  Measles, mumps, and rubella (MMR) vaccine. You may need at least one dose of MMR. You may also need a second dose.  Pneumococcal 13-valent conjugate (PCV13) vaccine. You may need this if you have certain conditions and were not previously vaccinated.  Pneumococcal polysaccharide (PPSV23) vaccine. You may need one or two doses if you smoke cigarettes or if you have certain conditions.  Meningococcal vaccine. One dose is recommended if you are age 19-21 years and a first-year college student living in a residence hall, or if you have one of several medical conditions. You may also need additional booster doses.  Hepatitis A vaccine. You may need this if you have certain conditions or if you travel or work in places where you may be exposed to hepatitis A.  Hepatitis B vaccine. You may need this if you have certain conditions or if you travel or work in places where you may be exposed to hepatitis B.  Haemophilus influenzae type b (Hib) vaccine. You may need   this if you have certain risk factors. Talk to your health care provider about which screenings and vaccines you need and how often you need them. This information is not intended to replace advice given to you by your health care provider. Make sure you discuss any questions you have with your health care provider. Document Released: 01/20/2002 Document Revised:  08/13/2016 Document Reviewed: 09/25/2015 Elsevier Interactive Patient Education  2017 [employer] Inc[employer]  

## 2016-11-25 NOTE — Progress Notes (Signed)
Subjective:   Katelyn Wilson is a 40 y.o. G0P0000 African American female here for a routine well-woman exam.  Patient's last menstrual period was 11/10/2016.    Current complaints: none PCP: [facility]       does desire labs  Social History: Sexual: heterosexual Marital Status: married Living situation: with spouse Occupation: Psychologist, counselling with [employer] Tobacco/alcohol: no tobacco use Illicit drugs: no history of illicit drug use  The following portions of the patient's history were reviewed and updated as appropriate: allergies, current medications, past family history, past medical history, past social history, past surgical history and problem list.  Past Medical History Past Medical History:  Diagnosis Date  . allergic rhinitis   . Anxiety   . Diabetes mellitus   . Hypertension   . Obesity   . Obesity (BMI 30-39.9)     Past Surgical History Past Surgical History:  Procedure Laterality Date  . none      Gynecologic History G0P0000  Patient's last menstrual period was 11/10/2016. Contraception: condoms Last Pap: 2016. Results were: negative with +HPV   Obstetric History OB History  Gravida Para Term Preterm AB Living  0 0 0 0 0 0  SAB TAB Ectopic Multiple Live Births  0 0 0 0 0        Current Medications Current Outpatient Prescriptions on File Prior to Visit  Medication Sig Dispense Refill  . albuterol (PROVENTIL HFA;VENTOLIN HFA) 108 (90 Base) MCG/ACT inhaler Inhale 2 puffs into the lungs every 6 (six) hours as needed for wheezing or shortness of breath. 1 Inhaler 2  . amLODipine (NORVASC) 5 MG tablet Take one-half tablet by  mouth daily 45 tablet 3  . cetirizine (ZYRTEC) 10 MG tablet Take 10 mg by mouth as needed.      . hydrochlorothiazide (HYDRODIURIL) 25 MG tablet Take 1 tablet (25 mg total) by mouth daily. 90 tablet 3   No current facility-administered medications on file prior to visit.     Review of Systems Patient denies any headaches, blurred  vision, shortness of breath, chest pain, abdominal pain, problems with bowel movements, urination, or intercourse.  Objective:  BP 112/83   Pulse 88   Ht 5\' 4"  (1.626 m)   Wt 221 lb 14.4 oz (100.7 kg)   LMP 11/10/2016   BMI 38.09 kg/m  Physical Exam  General:  Well developed, well nourished, no acute distress. She is alert and oriented x3. Skin:  Warm and dry Neck:  Midline trachea, no thyromegaly or nodules Cardiovascular: Regular rate and rhythm, no murmur heard Lungs:  Effort normal, all lung fields clear to auscultation bilaterally Breasts:  No dominant palpable mass, retraction, or nipple discharge Abdomen:  Soft, non tender, no hepatosplenomegaly or masses Pelvic:  External genitalia is normal in appearance.  The vagina is normal in appearance. The cervix is bulbous, no CMT.  Thin prep pap is done with HR HPV cotesting. Uterus is felt to be normal size, shape, and contour.  No adnexal masses or tenderness noted. Extremities:  No swelling or varicosities noted Psych:  She has a normal mood and affect  Assessment:   Healthy well-woman exam Hypertension Obesity   Plan:  Has had flu vaccine at work Labs obtained F/U  year for AE, or sooner if needed   Myrissa Chipley Rockney Ghee, CNM

## 2016-11-26 LAB — THYROID PANEL WITH TSH
FREE THYROXINE INDEX: 2 (ref 1.2–4.9)
T3 Uptake Ratio: 23 % — ABNORMAL LOW (ref 24–39)
T4, Total: 8.7 ug/dL (ref 4.5–12.0)
TSH: 1.92 u[IU]/mL (ref 0.450–4.500)

## 2016-11-26 LAB — COMPREHENSIVE METABOLIC PANEL
ALBUMIN: 3.8 g/dL (ref 3.5–5.5)
ALK PHOS: 71 IU/L (ref 39–117)
ALT: 7 IU/L (ref 0–32)
AST: 12 IU/L (ref 0–40)
Albumin/Globulin Ratio: 1 — ABNORMAL LOW (ref 1.2–2.2)
BILIRUBIN TOTAL: 0.2 mg/dL (ref 0.0–1.2)
BUN / CREAT RATIO: 12 (ref 9–23)
BUN: 8 mg/dL (ref 6–20)
CHLORIDE: 98 mmol/L (ref 96–106)
CO2: 23 mmol/L (ref 18–29)
Calcium: 8.9 mg/dL (ref 8.7–10.2)
Creatinine, Ser: 0.67 mg/dL (ref 0.57–1.00)
GFR calc Af Amer: 128 mL/min/{1.73_m2} (ref >59)
GFR calc non Af Amer: 111 mL/min/{1.73_m2} (ref >59)
GLOBULIN, TOTAL: 3.9 g/dL (ref 1.5–4.5)
Glucose: 94 mg/dL (ref 65–99)
POTASSIUM: 4.7 mmol/L (ref 3.5–5.2)
SODIUM: 137 mmol/L (ref 134–144)
Total Protein: 7.7 g/dL (ref 6.0–8.5)

## 2016-11-26 LAB — LIPID PANEL
CHOLESTEROL TOTAL: 189 mg/dL (ref 100–199)
Chol/HDL Ratio: 3.7 ratio units (ref 0.0–4.4)
HDL: 51 mg/dL (ref >39)
LDL CALC: 113 mg/dL — AB (ref 0–99)
Triglycerides: 124 mg/dL (ref 0–149)
VLDL CHOLESTEROL CAL: 25 mg/dL (ref 5–40)

## 2016-11-26 LAB — HEMOGLOBIN A1C
Est. average glucose Bld gHb Est-mCnc: 128 mg/dL
HEMOGLOBIN A1C: 6.1 % — AB (ref 4.8–5.6)

## 2016-11-26 LAB — VITAMIN D 25 HYDROXY (VIT D DEFICIENCY, FRACTURES): Vit D, 25-Hydroxy: 7.7 ng/mL — ABNORMAL LOW (ref 30.0–100.0)

## 2016-11-27 ENCOUNTER — Encounter: Payer: 59 | Admitting: Obstetrics and Gynecology

## 2016-11-27 LAB — CYTOLOGY - PAP

## 2016-12-02 ENCOUNTER — Encounter: Payer: Self-pay | Admitting: Obstetrics and Gynecology

## 2016-12-02 ENCOUNTER — Other Ambulatory Visit: Payer: Self-pay | Admitting: Obstetrics and Gynecology

## 2016-12-02 DIAGNOSIS — E559 Vitamin D deficiency, unspecified: Secondary | ICD-10-CM | POA: Insufficient documentation

## 2016-12-02 MED ORDER — VITAMIN D (ERGOCALCIFEROL) 1.25 MG (50000 UNIT) PO CAPS: 50000.0000 [IU] | ORAL_CAPSULE | ORAL | 2 refills | Status: DC

## 2016-12-03 ENCOUNTER — Encounter (INDEPENDENT_AMBULATORY_CARE_PROVIDER_SITE_OTHER): Payer: 59 | Admitting: Family Medicine

## 2016-12-03 ENCOUNTER — Telehealth: Payer: Self-pay | Admitting: *Deleted

## 2016-12-03 ENCOUNTER — Other Ambulatory Visit: Payer: Self-pay | Admitting: Obstetrics and Gynecology

## 2016-12-03 MED ORDER — VITAMIN D (ERGOCALCIFEROL) 1.25 MG (50000 UNIT) PO CAPS: 50000.0000 [IU] | ORAL_CAPSULE | ORAL | 2 refills | Status: AC

## 2016-12-03 NOTE — Telephone Encounter (Signed)
Mailed all info to pt 

## 2016-12-03 NOTE — Telephone Encounter (Signed)
-----   Message from Joylene Igo, [place] sent at 12/02/2016  9:09 AM EST ----- Please mail info on Vit D

## 2016-12-11 ENCOUNTER — Encounter: Payer: Self-pay | Admitting: Internal Medicine

## 2016-12-15 ENCOUNTER — Ambulatory Visit: Payer: 59 | Admitting: Internal Medicine

## 2016-12-17 ENCOUNTER — Other Ambulatory Visit: Payer: 59

## 2016-12-23 ENCOUNTER — Ambulatory Visit (INDEPENDENT_AMBULATORY_CARE_PROVIDER_SITE_OTHER): Payer: 59 | Admitting: Family Medicine

## 2016-12-23 ENCOUNTER — Encounter (INDEPENDENT_AMBULATORY_CARE_PROVIDER_SITE_OTHER): Payer: Self-pay | Admitting: Family Medicine

## 2016-12-23 VITALS — BP 115/79 | HR 91 | Temp 98.7°F | Resp 16 | Ht 64.0 in | Wt 214.0 lb

## 2016-12-23 DIAGNOSIS — E119 Type 2 diabetes mellitus without complications: Secondary | ICD-10-CM | POA: Diagnosis not present

## 2016-12-23 DIAGNOSIS — Z1389 Encounter for screening for other disorder: Secondary | ICD-10-CM | POA: Diagnosis not present

## 2016-12-23 DIAGNOSIS — Z9189 Other specified personal risk factors, not elsewhere classified: Secondary | ICD-10-CM

## 2016-12-23 DIAGNOSIS — E559 Vitamin D deficiency, unspecified: Secondary | ICD-10-CM | POA: Diagnosis not present

## 2016-12-23 DIAGNOSIS — Z0289 Encounter for other administrative examinations: Secondary | ICD-10-CM

## 2016-12-23 DIAGNOSIS — R5383 Other fatigue: Secondary | ICD-10-CM

## 2016-12-23 DIAGNOSIS — E669 Obesity, unspecified: Secondary | ICD-10-CM

## 2016-12-23 DIAGNOSIS — Z6836 Body mass index (BMI) 36.0-36.9, adult: Secondary | ICD-10-CM | POA: Diagnosis not present

## 2016-12-23 DIAGNOSIS — R0602 Shortness of breath: Secondary | ICD-10-CM | POA: Diagnosis not present

## 2016-12-23 DIAGNOSIS — I10 Essential (primary) hypertension: Secondary | ICD-10-CM

## 2016-12-23 DIAGNOSIS — Z1331 Encounter for screening for depression: Secondary | ICD-10-CM

## 2016-12-23 NOTE — Progress Notes (Signed)
Office: [phone]  /  Fax: [phone]   HPI:   Chief Complaint: OBESITY  Katelyn Wilson (MR# [id number]) is a 41 y.o. female who presents on 12/23/2016 for obesity evaluation and treatment. Current BMI is Body mass index is 36.73 kg/m.Katelyn Wilson has struggled with obesity for years and has been unsuccessful in either losing weight or maintaining long term weight loss. Katelyn Wilson attended our information session and states she is currently in the action stage of change and ready to dedicate time achieving and maintaining a healthier weight.  Katelyn Wilson states her desired weight loss is 49. she has been heavy most of  her life she started gaining weight in the 2nd grade. her heaviest weight ever was 247 lbs. she has significant food cravings issues  she snacks frequently in the evenings she skips meals frequently she is frequently drinking liquids with calories she frequently makes poor food choices she frequently eats larger portions than normal    Fatigue Katelyn Wilson feels her energy is lower than it should be. This has worsened with weight gain and has not worsened recently. Avonell admits to daytime somnolence and  admits to waking up still tired. Patient is at risk for obstructive sleep apnea. Patent has a history of symptoms of morning fatigue. Patient generally gets 6 or 7 hours of sleep per night, and states they generally have generally restful sleep. Snoring is present. Apneic episodes is not present. Epworth Sleepiness Score is 3.  Dyspnea on exertion Davida notes increasing shortness of breath with exercising and seems to be worsening over time with weight gain. She notes getting out of breath sooner with activity than she used to. This has not gotten worse recently. Katelyn Wilson denies orthopnea.  Hypertension Katelyn Wilson is a 41 y.o. female with hypertension.  She is taking her  medications as instructed, no medication side effects noted, no TIA's, no chest pain on exertion, no hypotension and has a normal  EKG. She is working weight loss to help control her blood pressure with the goal of decreasing her BP medications and decreasing her risk of heart attack and stroke. [place] blood pressure is currently controlled on meds.   Diabetes II Katelyn Wilson has a diagnosis of diabetes type II. Nidya states patient does not check sugars and denies any hypoglycemic episodes. Last A1c was Hemoglobin A1C Latest Ref Rng & Units 11/25/2016 06/13/2016  HGBA1C 4.8 - 5.6 % 6.1(H) 6.2(H)  Some recent data might be hidden    She has been working on intensive lifestyle modifications including diet, exercise, and weight loss to help control her blood glucose levels. She does have polyphagia. She does not check blood sugars at home   Vitamin D deficiency Katelyn Wilson has a diagnosis of vitamin D deficiency. She is currently taking vit D and denies nausea, vomiting or muscle weakness. Her last vitamin D levels were not yet at goal and she does state she is fatigue.   At risk for cardiovascular disease Katelyn Wilson is at a higher than average risk for cardiovascular disease due to obesity. She currently denies any chest pain.   Depression Screen Katelyn Wilson's Food and Mood (modified PHQ-9) score was  Depression screen PHQ 2/9 12/23/2016  Decreased Interest 0  Down, Depressed, Hopeless 1  PHQ - 2 Score 1  Altered sleeping 0  Tired, decreased energy 1  Change in appetite 1  Feeling bad or failure about yourself  0  Trouble concentrating 0  Moving slowly or fidgety/restless 0  Suicidal thoughts 0  PHQ-9 Score  3    ALLERGIES: Allergies  Allergen Reactions  . Penicillins     Amoxicillin    MEDICATIONS: Current Outpatient Prescriptions on File Prior to Visit  Medication Sig Dispense Refill  . albuterol (PROVENTIL HFA;VENTOLIN HFA) 108 (90 Base) MCG/ACT inhaler Inhale 2 puffs into the lungs every 6 (six) hours as needed for wheezing or shortness of breath. 1 Inhaler 2  . amLODipine (NORVASC) 5 MG tablet Take one-half tablet by  mouth daily  45 tablet 3  . cetirizine (ZYRTEC) 10 MG tablet Take 10 mg by mouth as needed.      . hydrochlorothiazide (HYDRODIURIL) 25 MG tablet Take 1 tablet (25 mg total) by mouth daily. 90 tablet 3  . Vitamin D, Ergocalciferol, (DRISDOL) 50000 units CAPS capsule Take 1 capsule (50,000 Units total) by mouth 2 (two) times a week. 30 capsule 2   No current facility-administered medications on file prior to visit.     PAST MEDICAL HISTORY: Past Medical History:  Diagnosis Date  . Acid reflux   . allergic rhinitis   . Anxiety   . Asthma   . Diabetes mellitus   . Hypertension   . Obesity   . Obesity (BMI 30-39.9)   . Prediabetes   . Vitamin D deficiency     PAST SURGICAL HISTORY: Past Surgical History:  Procedure Laterality Date  . none      SOCIAL HISTORY: Social History  Substance Use Topics  . Smoking status: Never Smoker  . Smokeless tobacco: Never Used  . Alcohol use 2.5 oz/week    5 Standard drinks or equivalent per week     Comment: occasional    FAMILY HISTORY: Family History  Problem Relation Age of Onset  . Heart disease Paternal Aunt 80    sudden death  . Heart disease Father     sudden cardiac death  . Diabetes Father   . Hypertension Father   . Sleep apnea Father   . Diabetes    . Lung cancer    . Cancer    . Diabetes Mother   . Sleep apnea Mother   . Obesity Mother   . Cancer Maternal Grandmother   . Cancer Maternal Grandfather     ROS: Review of Systems  Constitutional: Positive for malaise/fatigue.  Endo/Heme/Allergies:       Polyphagia   All other systems reviewed and are negative.   PHYSICAL EXAM: Blood pressure 115/79, pulse 91, temperature 98.7 F (37.1 C), temperature source Oral, resp. rate 16, height 5\' 4"  (1.626 m), weight 214 lb (97.1 kg), last menstrual period 11/29/2016, SpO2 100 %. Body mass index is 36.73 kg/m. Physical Exam  Constitutional: She is oriented to person, place, and time. She appears well-developed and well-nourished.   HENT:  Head: Normocephalic and atraumatic.  Eyes: EOM are normal. Pupils are equal, round, and reactive to light.  Neck: Normal range of motion. Neck supple.  Cardiovascular: Normal rate and regular rhythm.   Pulmonary/Chest: Effort normal and breath sounds normal.  Abdominal: Soft. Bowel sounds are normal.  Musculoskeletal: Normal range of motion. She exhibits edema (on BLE).  Neurological: She is alert and oriented to person, place, and time.  Skin: Skin is warm and dry.  Psychiatric: She has a normal mood and affect. Her behavior is normal.    RECENT LABS AND TESTS: BMET    Component Value Date/Time   NA 137 11/25/2016 1606   K 4.7 11/25/2016 1606   CL 98 11/25/2016 1606   CO2 23 11/25/2016  1606   GLUCOSE 94 11/25/2016 1606   GLUCOSE 93 06/13/2016 1641   BUN 8 11/25/2016 1606   CREATININE 0.67 11/25/2016 1606   CREATININE 0.64 06/13/2016 1641   CALCIUM 8.9 11/25/2016 1606   GFRNONAA 111 11/25/2016 1606   GFRAA 128 11/25/2016 1606   Lab Results  Component Value Date   HGBA1C 6.1 (H) 11/25/2016   No results found for: INSULIN CBC    Component Value Date/Time   WBC 4.9 12/05/2015 1018   RBC 4.59 12/05/2015 1018   HGB 12.9 12/05/2015 1018   HCT 40.1 12/05/2015 1018   PLT 376.0 12/05/2015 1018   MCV 87.4 12/05/2015 1018   MCHC 32.2 12/05/2015 1018   RDW 14.5 12/05/2015 1018   LYMPHSABS 1.6 12/05/2015 1018   MONOABS 0.3 12/05/2015 1018   EOSABS 0.3 12/05/2015 1018   BASOSABS 0.0 12/05/2015 1018   Iron/TIBC/Ferritin/ %Sat No results found for: IRON, TIBC, FERRITIN, IRONPCTSAT Lipid Panel     Component Value Date/Time   CHOL 189 11/25/2016 1606   TRIG 124 11/25/2016 1606   HDL 51 11/25/2016 1606   CHOLHDL 3.7 11/25/2016 1606   CHOLHDL 3.2 06/13/2016 1641   VLDL 15 06/13/2016 1641   LDLCALC 113 (H) 11/25/2016 1606   LDLDIRECT 118 06/13/2016 1641   Hepatic Function Panel     Component Value Date/Time   PROT 7.7 11/25/2016 1606   ALBUMIN 3.8 11/25/2016  1606   AST 12 11/25/2016 1606   ALT 7 11/25/2016 1606   ALKPHOS 71 11/25/2016 1606   BILITOT 0.2 11/25/2016 1606      Component Value Date/Time   TSH 1.920 11/25/2016 1606   TSH 1.38 03/02/2015   TSH 1.010 12/09/2013 1004    ECG  shows NSR with a rate of 92 bpm.  INDIRECT CALORIMETER done today shows a VO2 of 168 and a REE of 1166.    ASSESSMENT AND PLAN: Other fatigue - Plan: EKG 12-Lead, Vitamin B12, CBC With Differential, Comprehensive metabolic panel, Folate, Insulin, random, Lipid Panel With LDL/HDL Ratio, T3, T4, free, TSH, Hemoglobin A1c  Shortness of breath  Essential hypertension  Vitamin D deficiency - Plan: VITAMIN D 25 Hydroxy (Vit-D Deficiency, Fractures)  Depression screening  Class 2 obesity without serious comorbidity with body mass index (BMI) of 36.0 to 36.9 in adult, unspecified obesity type  PLAN:  Fatigue Katelyn Wilson was informed that her fatigue may be related to obesity, depression or many other causes. Labs will be ordered, and in the meanwhile Katelyn Wilson has agreed to work on diet, exercise and weight loss to help with fatigue. Proper sleep hygiene was discussed including the need for 7-8 hours of quality sleep each night. A sleep study was not ordered based on symptoms and Epworth score.  Dyspnea on exertion Katelyn Wilson's shortness of breath appears to be obesity related and exercise induced. She has agreed to work on weight loss and gradually increase exercise to treat her exercise induced shortness of breath. If Danea follows our instructions and loses weight without improvement of her shortness of breath, we will plan to refer to pulmonology. We will monitor this condition regularly. Katelyn Wilson agrees to this plan.  Hypertension We discussed sodium restriction, working on healthy weight loss, and a regular exercise program as the means to achieve improved blood pressure control. Katelyn Wilson agreed with this plan and agreed to follow up as directed. We will continue to monitor her  blood pressure as well as her progress with the above lifestyle modifications. She will continue her medications  as prescribed and will watch for signs of hypotension as she continues his lifestyle modifications.  Diabetes II Katelyn Wilson has been given extensive diabetes education by myself today including ideal fasting and post-prandial blood glucose readings, individual ideal HgA1c goals  and hypoglycemia prevention. We discussed the importance of good blood sugar control to decrease the likelihood of diabetic complications such as nephropathy, neuropathy, limb loss, blindness, coronary artery disease, and death. We discussed the importance of intensive lifestyle modification including diet, exercise and weight loss as the first line treatment for diabetes. Tinamarie agrees to continue her diabetes medications and will follow up at the agreed upon time. Diabetic diet given at visit today, will check labs and follow up.   Vitamin D Deficiency Katelyn Wilson was informed that low vitamin D levels contributes to fatigue and are associated with obesity, breast, and colon cancer. She agrees to continue to continue the Vit D @50 ,000 IU every week and will follow up for routine testing of vitamin D, at least 2-3 times per year. She was informed of the risk of over-replacement of vitamin D and agrees to not increase her dose unless he discusses this with [place] first. Will check her vitamin D level today and follow up.   Depression Screen Katelyn Wilson had a positive depression screening. Depression is commonly associated with obesity and often results in emotional eating behaviors. We will monitor this closely and work on CBT to help improve the non-hunger eating patterns. Referral to Psychology may be required if no improvement is seen as she continues in our clinic.  Risk counselling Cardiovascular risk counselling Katelyn Wilson was given extended (at least 15 minutes) coronary artery disease prevention counseling today. She is 41 y.o. female and  has risk factors for heart disease including obesity. We discussed intensive lifestyle modifications today with an emphasis on specific weight loss instructions and strategies. Pt was also informed of the importance of increasing exercise and decreasing saturated fats to help prevent heart disease.  Obesity Katelyn Wilson is currently in the action stage of change and her goal is to continue with weight loss efforts She has agreed to follow the Category 2 plan Katelyn Wilson has been instructed to work up to a goal of 150 minutes of combined cardio and strengthening exercise per week for weight loss and overall health benefits. We discussed the following Behavioral Modification Stratagies today: increasing lean protein intake, decreasing simple carbohydrates  and increasing lower sugar fruits, avoid skipping meals.     Katelyn Wilson has agreed to follow up with our clinic in 2 weeks. She was informed of the importance of frequent follow up visits to maximize her success with intensive lifestyle modifications for her multiple health conditions. She was informed we would discuss her lab results at her next visit unless there is a critical issue that needs to be addressed sooner. Katelyn Wilson agreed to keep her next visit at the agreed upon time to discuss these results.  I, April Moore, am acting as Education administrator for Dennard Nip, MD  I have reviewed the above documentation for accuracy and completeness, and I agree with the above. -Dennard Nip, MD

## 2016-12-24 LAB — CBC WITH DIFFERENTIAL
BASOS ABS: 0 10*3/uL (ref 0.0–0.2)
Basos: 1 %
EOS (ABSOLUTE): 0.3 10*3/uL (ref 0.0–0.4)
Eos: 5 %
HEMOGLOBIN: 10.9 g/dL — AB (ref 11.1–15.9)
Hematocrit: 32.6 % — ABNORMAL LOW (ref 34.0–46.6)
IMMATURE GRANS (ABS): 0 10*3/uL (ref 0.0–0.1)
Immature Granulocytes: 0 %
LYMPHS: 25 %
Lymphocytes Absolute: 1.5 10*3/uL (ref 0.7–3.1)
MCH: 28.7 pg (ref 26.6–33.0)
MCHC: 33.4 g/dL (ref 31.5–35.7)
MCV: 86 fL (ref 79–97)
MONOCYTES: 7 %
Monocytes Absolute: 0.4 10*3/uL (ref 0.1–0.9)
NEUTROS ABS: 3.7 10*3/uL (ref 1.4–7.0)
Neutrophils: 62 %
RBC: 3.8 x10E6/uL (ref 3.77–5.28)
RDW: 14 % (ref 12.3–15.4)
WBC: 6 10*3/uL (ref 3.4–10.8)

## 2016-12-24 LAB — COMPREHENSIVE METABOLIC PANEL
ALT: 12 IU/L (ref 0–32)
AST: 14 IU/L (ref 0–40)
Albumin/Globulin Ratio: 1 — ABNORMAL LOW (ref 1.2–2.2)
Albumin: 4 g/dL (ref 3.5–5.5)
Alkaline Phosphatase: 80 IU/L (ref 39–117)
BUN / CREAT RATIO: 11 (ref 9–23)
BUN: 8 mg/dL (ref 6–20)
Bilirubin Total: 0.3 mg/dL (ref 0.0–1.2)
CO2: 25 mmol/L (ref 18–29)
CREATININE: 0.74 mg/dL (ref 0.57–1.00)
Calcium: 9.4 mg/dL (ref 8.7–10.2)
Chloride: 97 mmol/L (ref 96–106)
GFR, EST AFRICAN AMERICAN: 118 mL/min/{1.73_m2} (ref >59)
GFR, EST NON AFRICAN AMERICAN: 102 mL/min/{1.73_m2} (ref >59)
GLUCOSE: 115 mg/dL — AB (ref 65–99)
Globulin, Total: 3.9 g/dL (ref 1.5–4.5)
Potassium: 4.6 mmol/L (ref 3.5–5.2)
Sodium: 137 mmol/L (ref 134–144)
TOTAL PROTEIN: 7.9 g/dL (ref 6.0–8.5)

## 2016-12-24 LAB — TSH: TSH: 1.07 u[IU]/mL (ref 0.450–4.500)

## 2016-12-24 LAB — VITAMIN B12: Vitamin B-12: <150 pg/mL — ABNORMAL LOW (ref 232–1245)

## 2016-12-24 LAB — INSULIN, RANDOM: INSULIN: 22.7 u[IU]/mL (ref 2.6–24.9)

## 2016-12-24 LAB — LIPID PANEL WITH LDL/HDL RATIO
Cholesterol, Total: 169 mg/dL (ref 100–199)
HDL: 42 mg/dL (ref >39)
LDL CALC: 114 mg/dL — AB (ref 0–99)
LDL/HDL RATIO: 2.7 ratio (ref 0.0–3.2)
TRIGLYCERIDES: 63 mg/dL (ref 0–149)
VLDL Cholesterol Cal: 13 mg/dL (ref 5–40)

## 2016-12-24 LAB — HEMOGLOBIN A1C
Est. average glucose Bld gHb Est-mCnc: 123 mg/dL
HEMOGLOBIN A1C: 5.9 % — AB (ref 4.8–5.6)

## 2016-12-24 LAB — VITAMIN D 25 HYDROXY (VIT D DEFICIENCY, FRACTURES): Vit D, 25-Hydroxy: 42.8 ng/mL (ref 30.0–100.0)

## 2016-12-24 LAB — T3: T3, Total: 144 ng/dL (ref 71–180)

## 2016-12-24 LAB — FOLATE: Folate: >20 ng/mL (ref >3.0)

## 2016-12-24 LAB — T4, FREE: Free T4: 1.12 ng/dL (ref 0.82–1.77)

## 2016-12-26 ENCOUNTER — Encounter (INDEPENDENT_AMBULATORY_CARE_PROVIDER_SITE_OTHER): Payer: Self-pay | Admitting: Family Medicine

## 2016-12-26 ENCOUNTER — Other Ambulatory Visit: Payer: 59

## 2017-01-05 ENCOUNTER — Ambulatory Visit (INDEPENDENT_AMBULATORY_CARE_PROVIDER_SITE_OTHER): Payer: 59 | Admitting: Family Medicine

## 2017-01-05 VITALS — BP 120/82 | HR 86 | Temp 97.8°F | Ht 64.0 in | Wt 218.0 lb

## 2017-01-05 DIAGNOSIS — E559 Vitamin D deficiency, unspecified: Secondary | ICD-10-CM

## 2017-01-05 DIAGNOSIS — E785 Hyperlipidemia, unspecified: Secondary | ICD-10-CM

## 2017-01-05 DIAGNOSIS — D508 Other iron deficiency anemias: Secondary | ICD-10-CM

## 2017-01-05 DIAGNOSIS — Z9189 Other specified personal risk factors, not elsewhere classified: Secondary | ICD-10-CM

## 2017-01-05 DIAGNOSIS — E669 Obesity, unspecified: Secondary | ICD-10-CM | POA: Diagnosis not present

## 2017-01-05 DIAGNOSIS — Z6837 Body mass index (BMI) 37.0-37.9, adult: Secondary | ICD-10-CM | POA: Diagnosis not present

## 2017-01-05 DIAGNOSIS — R7303 Prediabetes: Secondary | ICD-10-CM

## 2017-01-05 DIAGNOSIS — E538 Deficiency of other specified B group vitamins: Secondary | ICD-10-CM | POA: Diagnosis not present

## 2017-01-05 DIAGNOSIS — E66812 Obesity, class 2: Secondary | ICD-10-CM

## 2017-01-05 MED ORDER — METFORMIN HCL 500 MG PO TABS: 500.0000 mg | ORAL_TABLET | Freq: Every day | ORAL | 0 refills | Status: DC
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
## 2017-01-19 ENCOUNTER — Ambulatory Visit (INDEPENDENT_AMBULATORY_CARE_PROVIDER_SITE_OTHER): Payer: 59 | Admitting: Family Medicine

## 2017-01-21 ENCOUNTER — Ambulatory Visit (INDEPENDENT_AMBULATORY_CARE_PROVIDER_SITE_OTHER): Payer: 59 | Admitting: Family Medicine

## 2017-01-21 VITALS — BP 112/74 | HR 83 | Temp 98.4°F | Ht 64.0 in | Wt 213.0 lb

## 2017-01-21 DIAGNOSIS — E669 Obesity, unspecified: Secondary | ICD-10-CM

## 2017-01-21 DIAGNOSIS — Z6836 Body mass index (BMI) 36.0-36.9, adult: Secondary | ICD-10-CM

## 2017-01-21 DIAGNOSIS — K5900 Constipation, unspecified: Secondary | ICD-10-CM

## 2017-01-21 MED ORDER — POLYETHYLENE GLYCOL 3350 17 GM/SCOOP PO POWD: 17.0000 g | Freq: Every day | ORAL | 0 refills | Status: AC

## 2017-01-22 NOTE — Progress Notes (Signed)
Office: [phone]  /  Fax: [phone]   HPI:   Chief Complaint: OBESITY Katelyn Wilson is here to discuss her progress with her obesity treatment plan. She is following her eating plan approximately 80 % of the time and states she is exercising 0 minutes 0 times per week. Katelyn Wilson continues to do well with weight loss but is currently struggling with remembering to eat breakfast, journaling some, not eating enough protein and c are too low.  Her weight is 213 lb (96.6 kg) today and has had a weight loss of 5 pounds over a period of 2 weeks since her last visit. She has lost 1 lb since starting treatment with [place].  Constipation Katelyn Wilson notes a new problem of decreased frequency in bowel movements over the last 2 weeks and bowel movements are sometimes hard and painful. She denies melena. This has worsened since decreasing simple carbs in her diet.   Wt Readings from Last 500 Encounters:  01/21/17 213 lb (96.6 kg)  01/05/17 218 lb (98.9 kg)  12/23/16 214 lb (97.1 kg)  11/25/16 221 lb 14.4 oz (100.7 kg)  10/14/16 222 lb 12 oz (101 kg)  08/18/16 222 lb (100.7 kg)  06/13/16 226 lb 4 oz (102.6 kg)  12/05/15 237 lb (107.5 kg)  11/23/15 238 lb 4.8 oz (108.1 kg)  11/12/15 237 lb 8 oz (107.7 kg)  06/06/15 244 lb 4 oz (110.8 kg)  05/03/15 240 lb (108.9 kg)  12/06/14 244 lb 8 oz (110.9 kg)  08/07/14 234 lb (106.1 kg)  03/08/14 229 lb (103.9 kg)  01/27/14 230 lb 8 oz (104.6 kg)  10/21/13 234 lb 4 oz (106.3 kg)  06/22/13 229 lb 4 oz (104 kg)  08/31/12 233 lb 12 oz (106 kg)  06/18/12 232 lb (105.2 kg)  03/02/12 237 lb (107.5 kg)  01/01/12 236 lb (107 kg)  11/14/11 236 lb 4 oz (107.2 kg)  06/18/11 234 lb (106.1 kg)     ALLERGIES: Allergies  Allergen Reactions  . Penicillins     Amoxicillin    MEDICATIONS: Current Outpatient Prescriptions on File Prior to Visit  Medication Sig Dispense Refill  . albuterol (PROVENTIL HFA;VENTOLIN HFA) 108 (90 Base) MCG/ACT inhaler Inhale 2 puffs into the  lungs every 6 (six) hours as needed for wheezing or shortness of breath. 1 Inhaler 2  . amLODipine (NORVASC) 5 MG tablet Take one-half tablet by  mouth daily 45 tablet 3  . cetirizine (ZYRTEC) 10 MG tablet Take 10 mg by mouth as needed.      . hydrochlorothiazide (HYDRODIURIL) 25 MG tablet Take 1 tablet (25 mg total) by mouth daily. 90 tablet 3  . metFORMIN (GLUCOPHAGE) 500 MG tablet Take 1 tablet (500 mg total) by mouth daily with breakfast. 30 tablet 0  . Vitamin D, Ergocalciferol, (DRISDOL) 50000 units CAPS capsule Take 1 capsule (50,000 Units total) by mouth 2 (two) times a week. 30 capsule 2   No current facility-administered medications on file prior to visit.     PAST MEDICAL HISTORY: Past Medical History:  Diagnosis Date  . Acid reflux   . allergic rhinitis   . Anxiety   . Asthma   . Diabetes mellitus   . Hypertension   . Obesity   . Obesity (BMI 30-39.9)   . Prediabetes   . Vitamin D deficiency     PAST SURGICAL HISTORY: Past Surgical History:  Procedure Laterality Date  . none      SOCIAL HISTORY: Social History  Substance Use Topics  .  Smoking status: Never Smoker  . Smokeless tobacco: Never Used  . Alcohol use 2.5 oz/week    5 Standard drinks or equivalent per week     Comment: occasional    FAMILY HISTORY: Family History  Problem Relation Age of Onset  . Heart disease Paternal Aunt 75    sudden death  . Heart disease Father     sudden cardiac death  . Diabetes Father   . Hypertension Father   . Sleep apnea Father   . Diabetes    . Lung cancer    . Cancer    . Diabetes Mother   . Sleep apnea Mother   . Obesity Mother   . Cancer Maternal Grandmother   . Cancer Maternal Grandfather     ROS: Review of Systems  Constitutional: Positive for weight loss.  Gastrointestinal: Positive for constipation. Negative for melena.    PHYSICAL EXAM: Blood pressure 112/74, pulse 83, temperature 98.4 F (36.9 C), temperature source Oral, height 5\' 4"   (1.626 m), weight 213 lb (96.6 kg), last menstrual period 01/21/2017, SpO2 100 %. Body mass index is 36.56 kg/m. Physical Exam  Constitutional: She is oriented to person, place, and time. She appears well-developed and well-nourished.  Cardiovascular: Normal rate.   Pulmonary/Chest: Effort normal.  Musculoskeletal: Normal range of motion.  Neurological: She is alert and oriented to person, place, and time.  Skin: Skin is warm and dry.  Psychiatric: She has a normal mood and affect. Her behavior is normal.  Vitals reviewed.   RECENT LABS AND TESTS: BMET    Component Value Date/Time   NA 137 12/23/2016 1030   K 4.6 12/23/2016 1030   CL 97 12/23/2016 1030   CO2 25 12/23/2016 1030   GLUCOSE 115 (H) 12/23/2016 1030   GLUCOSE 93 06/13/2016 1641   BUN 8 12/23/2016 1030   CREATININE 0.74 12/23/2016 1030   CREATININE 0.64 06/13/2016 1641   CALCIUM 9.4 12/23/2016 1030   GFRNONAA 102 12/23/2016 1030   GFRAA 118 12/23/2016 1030   Lab Results  Component Value Date   HGBA1C 5.9 (H) 12/23/2016   HGBA1C 6.1 (H) 11/25/2016   HGBA1C 6.2 (H) 06/13/2016   HGBA1C 6.5 (H) 11/23/2015   HGBA1C 6.6 (A) 05/08/2015   Lab Results  Component Value Date   INSULIN 22.7 12/23/2016   CBC    Component Value Date/Time   WBC 6.0 12/23/2016 1030   WBC 4.9 12/05/2015 1018   RBC 3.80 12/23/2016 1030   RBC 4.59 12/05/2015 1018   HGB 12.9 12/05/2015 1018   HCT 32.6 (L) 12/23/2016 1030   PLT 376.0 12/05/2015 1018   MCV 86 12/23/2016 1030   MCH 28.7 12/23/2016 1030   MCHC 33.4 12/23/2016 1030   MCHC 32.2 12/05/2015 1018   RDW 14.0 12/23/2016 1030   LYMPHSABS 1.5 12/23/2016 1030   MONOABS 0.3 12/05/2015 1018   EOSABS 0.3 12/23/2016 1030   BASOSABS 0.0 12/23/2016 1030   Iron/TIBC/Ferritin/ %Sat No results found for: IRON, TIBC, FERRITIN, IRONPCTSAT Lipid Panel     Component Value Date/Time   CHOL 169 12/23/2016 1030   TRIG 63 12/23/2016 1030   HDL 42 12/23/2016 1030   CHOLHDL 3.7 11/25/2016  1606   CHOLHDL 3.2 06/13/2016 1641   VLDL 15 06/13/2016 1641   LDLCALC 114 (H) 12/23/2016 1030   LDLDIRECT 118 06/13/2016 1641   Hepatic Function Panel     Component Value Date/Time   PROT 7.9 12/23/2016 1030   ALBUMIN 4.0 12/23/2016 1030   AST  14 12/23/2016 1030   ALT 12 12/23/2016 1030   ALKPHOS 80 12/23/2016 1030   BILITOT 0.3 12/23/2016 1030      Component Value Date/Time   TSH 1.070 12/23/2016 1030   TSH 1.920 11/25/2016 1606   TSH 1.38 03/02/2015   TSH 1.010 12/09/2013 1004    ASSESSMENT AND PLAN: Constipation, unspecified constipation type - Plan: polyethylene glycol powder (GLYCOLAX/MIRALAX) powder  Class 2 obesity without serious comorbidity with body mass index (BMI) of 36.0 to 36.9 in adult, unspecified obesity type  PLAN:  Constipation Nayelie agrees to start Miralax 17 grams daily 1 month with no refills and to increase water and fiber and we will follow up in 2 weeks.  Obesity Jeanifer is currently in the action stage of change. As such, her goal is to continue with weight loss efforts She has agreed to keep a food journal with 1100 to 1400 calories and 75+ grams of protein daily Romelle has been instructed to work up to a goal of 150 minutes of combined cardio and strengthening exercise per week for weight loss and overall health benefits. We discussed the following Behavioral Modification Stratagies today: increasing lean protein intake, decreasing simple carbohydrates , increasing vegetables and work on meal planning and easy cooking plans. Recipe given today.  We spent > than 50% of the 30 minute visit on the counseling as documented in the note.   Zafira has agreed to follow up with our clinic in 2 weeks. She was informed of the importance of frequent follow up visits to maximize her success with intensive lifestyle modifications for her multiple health conditions.  I, Doreene Nest, am acting as scribe for Dennard Nip, MD  I have reviewed the above  documentation for accuracy and completeness, and I agree with the above. -Dennard Nip, MD

## 2017-01-24 ENCOUNTER — Encounter: Payer: Self-pay | Admitting: Internal Medicine

## 2017-01-27 ENCOUNTER — Other Ambulatory Visit: Payer: Self-pay | Admitting: Internal Medicine

## 2017-01-27 DIAGNOSIS — B9689 Other specified bacterial agents as the cause of diseases classified elsewhere: Secondary | ICD-10-CM

## 2017-01-27 DIAGNOSIS — J028 Acute pharyngitis due to other specified organisms: Principal | ICD-10-CM

## 2017-01-27 MED ORDER — HYDROCHLOROTHIAZIDE 25 MG PO TABS: 25.0000 mg | ORAL_TABLET | Freq: Every day | ORAL | 3 refills | Status: DC

## 2017-01-27 MED ORDER — AMLODIPINE BESYLATE 5 MG PO TABS: 2.5000 mg | ORAL_TABLET | Freq: Every day | ORAL | 3 refills | Status: DC

## 2017-02-02 ENCOUNTER — Ambulatory Visit (INDEPENDENT_AMBULATORY_CARE_PROVIDER_SITE_OTHER): Payer: 59 | Admitting: Ophthalmology

## 2017-02-02 DIAGNOSIS — H33303 Unspecified retinal break, bilateral: Secondary | ICD-10-CM

## 2017-02-02 DIAGNOSIS — H35033 Hypertensive retinopathy, bilateral: Secondary | ICD-10-CM | POA: Diagnosis not present

## 2017-02-02 DIAGNOSIS — I10 Essential (primary) hypertension: Secondary | ICD-10-CM | POA: Diagnosis not present

## 2017-02-02 DIAGNOSIS — H2513 Age-related nuclear cataract, bilateral: Secondary | ICD-10-CM | POA: Diagnosis not present

## 2017-02-02 DIAGNOSIS — H43813 Vitreous degeneration, bilateral: Secondary | ICD-10-CM | POA: Diagnosis not present

## 2017-02-03 ENCOUNTER — Ambulatory Visit (INDEPENDENT_AMBULATORY_CARE_PROVIDER_SITE_OTHER): Payer: 59 | Admitting: Family Medicine

## 2017-02-03 ENCOUNTER — Encounter (INDEPENDENT_AMBULATORY_CARE_PROVIDER_SITE_OTHER): Payer: Self-pay | Admitting: Family Medicine

## 2017-02-03 VITALS — BP 104/71 | HR 83 | Temp 98.2°F | Ht 64.0 in | Wt 216.0 lb

## 2017-02-03 DIAGNOSIS — E66812 Obesity, class 2: Secondary | ICD-10-CM | POA: Insufficient documentation

## 2017-02-03 DIAGNOSIS — E1169 Type 2 diabetes mellitus with other specified complication: Secondary | ICD-10-CM | POA: Diagnosis not present

## 2017-02-03 DIAGNOSIS — I10 Essential (primary) hypertension: Secondary | ICD-10-CM | POA: Diagnosis not present

## 2017-02-03 DIAGNOSIS — Z9189 Other specified personal risk factors, not elsewhere classified: Secondary | ICD-10-CM

## 2017-02-03 DIAGNOSIS — E669 Obesity, unspecified: Secondary | ICD-10-CM | POA: Diagnosis not present

## 2017-02-03 DIAGNOSIS — E785 Hyperlipidemia, unspecified: Secondary | ICD-10-CM | POA: Diagnosis not present

## 2017-02-03 DIAGNOSIS — Z6837 Body mass index (BMI) 37.0-37.9, adult: Secondary | ICD-10-CM | POA: Diagnosis not present

## 2017-02-03 MED ORDER — ATORVASTATIN CALCIUM 10 MG PO TABS: 10.0000 mg | ORAL_TABLET | Freq: Every day | ORAL | 0 refills | Status: DC

## 2017-02-03 NOTE — Progress Notes (Signed)
Office: [phone]  /  Fax: [phone]   HPI:   Chief Complaint: OBESITY Katelyn Wilson is here to discuss her progress with her obesity treatment plan. She is following her eating plan approximately 90 % of the time and states she is exercising 0 minutes 0 times per week. Katelyn Wilson has had increased stress and hasn't done well with journaling. Calories and her protein is nowhere near adequate. She has increased fatigue and is retaining some fluid. Her weight is 216 lb (98 kg) today and has had a weight gain of 3 lbs over a period of 2 weeks since her last visit. She has lost 0 lbs since starting treatment with [place].  Hypertension Katelyn Wilson is a 41 y.o. female with hypertension.  Katelyn Wilson denies headache, chest pain or shortness of breath on exertion. She is working weight loss to help control her blood pressure with the goal of decreasing her risk of heart attack and stroke. Her blood pressure is still continuing to go down with Katelyn Wilson changing her diet. She has been off Norvasc for 1 week and her blood pressure is still at it's lowest in 2 months. Katelyn Wilson blood pressure is currently controlled.  Hyperlipidemia Katelyn Wilson has hyperlipidemia and is not currently on Statin. Her LDL is elevated at 113 and she has diabetes. She has resisted staying on a Statin in the past. We discussed risks and benefits and she agreed to start Lipitor. She has been trying to improve her cholesterol levels with intensive lifestyle modification including a low saturated fat diet, exercise and weight loss. She denies any chest pain, claudication or myalgias.  At risk for cardiovascular disease Katelyn Wilson is at a higher than average risk for cardiovascular disease due to obesity. She currently denies any chest pain.  Diabetes II Katelyn Wilson has a diagnosis of diabetes type II. Katelyn Wilson started Metformin and polyphagia decreased significantly, to the point she didn't want to eat, so she stopped taking.  Last A1c was at 5.9 She has been working on  intensive lifestyle modifications including diet, exercise, and weight loss to help control her blood glucose levels.  Wt Readings from Last 500 Encounters:  02/03/17 216 lb (98 kg)  01/21/17 213 lb (96.6 kg)  01/05/17 218 lb (98.9 kg)  12/23/16 214 lb (97.1 kg)  11/25/16 221 lb 14.4 oz (100.7 kg)  10/14/16 222 lb 12 oz (101 kg)  08/18/16 222 lb (100.7 kg)  06/13/16 226 lb 4 oz (102.6 kg)  12/05/15 237 lb (107.5 kg)  11/23/15 238 lb 4.8 oz (108.1 kg)  11/12/15 237 lb 8 oz (107.7 kg)  06/06/15 244 lb 4 oz (110.8 kg)  05/03/15 240 lb (108.9 kg)  12/06/14 244 lb 8 oz (110.9 kg)  08/07/14 234 lb (106.1 kg)  03/08/14 229 lb (103.9 kg)  01/27/14 230 lb 8 oz (104.6 kg)  10/21/13 234 lb 4 oz (106.3 kg)  06/22/13 229 lb 4 oz (104 kg)  08/31/12 233 lb 12 oz (106 kg)  06/18/12 232 lb (105.2 kg)  03/02/12 237 lb (107.5 kg)  01/01/12 236 lb (107 kg)  11/14/11 236 lb 4 oz (107.2 kg)  06/18/11 234 lb (106.1 kg)     ALLERGIES: Allergies  Allergen Reactions  . Penicillins     Amoxicillin    MEDICATIONS: Current Outpatient Prescriptions on File Prior to Visit  Medication Sig Dispense Refill  . albuterol (PROVENTIL HFA;VENTOLIN HFA) 108 (90 Base) MCG/ACT inhaler Inhale 2 puffs into the lungs every 6 (six) hours as needed  for wheezing or shortness of breath. 1 Inhaler 2  . cetirizine (ZYRTEC) 10 MG tablet Take 10 mg by mouth as needed.      . hydrochlorothiazide (HYDRODIURIL) 25 MG tablet Take 1 tablet (25 mg total) by mouth daily. 90 tablet 3  . polyethylene glycol powder (GLYCOLAX/MIRALAX) powder Take 17 g by mouth daily. 510 g 0  . Vitamin D, Ergocalciferol, (DRISDOL) 50000 units CAPS capsule Take 1 capsule (50,000 Units total) by mouth 2 (two) times a week. 30 capsule 2   No current facility-administered medications on file prior to visit.     PAST MEDICAL HISTORY: Past Medical History:  Diagnosis Date  . Acid reflux   . allergic rhinitis   . Anxiety   . Asthma   . Diabetes  mellitus   . Hypertension   . Obesity   . Obesity (BMI 30-39.9)   . Prediabetes   . Vitamin D deficiency     PAST SURGICAL HISTORY: Past Surgical History:  Procedure Laterality Date  . none      SOCIAL HISTORY: Social History  Substance Use Topics  . Smoking status: Never Smoker  . Smokeless tobacco: Never Used  . Alcohol use 2.5 oz/week    5 Standard drinks or equivalent per week     Comment: occasional    FAMILY HISTORY: Family History  Problem Relation Age of Onset  . Heart disease Paternal Aunt 50    sudden death  . Heart disease Father     sudden cardiac death  . Diabetes Father   . Hypertension Father   . Sleep apnea Father   . Diabetes    . Lung cancer    . Cancer    . Diabetes Mother   . Sleep apnea Mother   . Obesity Mother   . Cancer Maternal Grandmother   . Cancer Maternal Grandfather     ROS: Review of Systems  Constitutional: Negative for weight loss.  Respiratory: Negative for shortness of breath (on exertion).   Cardiovascular: Negative for chest pain and claudication.  Musculoskeletal: Negative for myalgias.  Neurological: Negative for headaches.    PHYSICAL EXAM: Blood pressure 104/71, pulse 83, temperature 98.2 F (36.8 C), temperature source Oral, height 5\' 4"  (1.626 m), weight 216 lb (98 kg), last menstrual period 01/21/2017, SpO2 100 %. Body mass index is 37.08 kg/m. Physical Exam  Constitutional: She is oriented to person, place, and time. She appears well-developed and well-nourished.  Cardiovascular: Normal rate.   Pulmonary/Chest: Effort normal.  Musculoskeletal: Normal range of motion.  Neurological: She is oriented to person, place, and time.  Skin: Skin is warm and dry.  Psychiatric: She has a normal mood and affect. Her behavior is normal.  Vitals reviewed.   RECENT LABS AND TESTS: BMET    Component Value Date/Time   NA 137 12/23/2016 1030   K 4.6 12/23/2016 1030   CL 97 12/23/2016 1030   CO2 25 12/23/2016 1030     GLUCOSE 115 (H) 12/23/2016 1030   GLUCOSE 93 06/13/2016 1641   BUN 8 12/23/2016 1030   CREATININE 0.74 12/23/2016 1030   CREATININE 0.64 06/13/2016 1641   CALCIUM 9.4 12/23/2016 1030   GFRNONAA 102 12/23/2016 1030   GFRAA 118 12/23/2016 1030   Lab Results  Component Value Date   HGBA1C 5.9 (H) 12/23/2016   HGBA1C 6.1 (H) 11/25/2016   HGBA1C 6.2 (H) 06/13/2016   HGBA1C 6.5 (H) 11/23/2015   HGBA1C 6.6 (A) 05/08/2015   Lab Results  Component Value  Date   INSULIN 22.7 12/23/2016   CBC    Component Value Date/Time   WBC 6.0 12/23/2016 1030   WBC 4.9 12/05/2015 1018   RBC 3.80 12/23/2016 1030   RBC 4.59 12/05/2015 1018   HGB 12.9 12/05/2015 1018   HCT 32.6 (L) 12/23/2016 1030   PLT 376.0 12/05/2015 1018   MCV 86 12/23/2016 1030   MCH 28.7 12/23/2016 1030   MCHC 33.4 12/23/2016 1030   MCHC 32.2 12/05/2015 1018   RDW 14.0 12/23/2016 1030   LYMPHSABS 1.5 12/23/2016 1030   MONOABS 0.3 12/05/2015 1018   EOSABS 0.3 12/23/2016 1030   BASOSABS 0.0 12/23/2016 1030   Iron/TIBC/Ferritin/ %Sat No results found for: IRON, TIBC, FERRITIN, IRONPCTSAT Lipid Panel     Component Value Date/Time   CHOL 169 12/23/2016 1030   TRIG 63 12/23/2016 1030   HDL 42 12/23/2016 1030   CHOLHDL 3.7 11/25/2016 1606   CHOLHDL 3.2 06/13/2016 1641   VLDL 15 06/13/2016 1641   LDLCALC 114 (H) 12/23/2016 1030   LDLDIRECT 118 06/13/2016 1641   Hepatic Function Panel     Component Value Date/Time   PROT 7.9 12/23/2016 1030   ALBUMIN 4.0 12/23/2016 1030   AST 14 12/23/2016 1030   ALT 12 12/23/2016 1030   ALKPHOS 80 12/23/2016 1030   BILITOT 0.3 12/23/2016 1030      Component Value Date/Time   TSH 1.070 12/23/2016 1030   TSH 1.920 11/25/2016 1606   TSH 1.38 03/02/2015   TSH 1.010 12/09/2013 1004    ASSESSMENT AND PLAN: Diabetes mellitus type 2 in obese ([place])  Essential hypertension  Hyperlipidemia, unspecified hyperlipidemia type - Plan: atorvastatin (LIPITOR) 10 MG tablet  At risk  for heart disease  Class 2 obesity without serious comorbidity with body mass index (BMI) of 37.0 to 37.9 in adult, unspecified obesity type  PLAN:  Hypertension We discussed sodium restriction, working on healthy weight loss, and a regular exercise program as the means to achieve improved blood pressure control. Katelyn Wilson agreed with this plan and agreed to follow up as directed. We will continue to monitor her blood pressure as well as her progress with the above lifestyle modifications. She agreed to discontinue Norvasc and continue HCTZ 25 mg daily and we will re-check blood pressure in 2 weeks to see if she is still well controlled. She will watch for signs of hypotension as she continues her lifestyle modifications.  Hyperlipidemia Katelyn Wilson was informed of the American Heart Association Guidelines emphasizing intensive lifestyle modifications as the first line treatment for hyperlipidemia. We discussed many lifestyle modifications today in depth, and Katelyn Wilson will continue to work on decreasing saturated fats such as fatty red meat, butter and many fried foods. She will also increase vegetables and lean protein in her diet and continue to work on exercise and weight loss efforts. She agreed to start to take Lipitor 10 mg every night #30 with no refills and we will re-check labs in 6 weeks.  Cardiovascular risk counselling Katelyn Wilson was given extended (at least 15 minutes) coronary artery disease prevention counseling today. She is 41 y.o. female and has risk factors for heart disease including obesity. We discussed intensive lifestyle modifications today with an emphasis on specific weight loss instructions and strategies. Pt was also informed of the importance of increasing exercise and decreasing saturated fats to help prevent heart disease.  Diabetes II Katelyn Wilson has been given extensive diabetes education by myself today including ideal fasting and post-prandial blood glucose readings, individual ideal Hgb  A1c  goals  and hypoglycemia prevention. We discussed the importance of good blood sugar control to decrease the likelihood of diabetic complications such as nephropathy, neuropathy, limb loss, blindness, coronary artery disease, and death. We discussed the importance of intensive lifestyle modification including diet, exercise and weight loss as the first line treatment for diabetes. Katelyn Wilson agrees to discontinue Metformin and continue with diet and exercise and we will re-check labs in 6 weeks.  Obesity Katelyn Wilson is currently in the action stage of change. As such, her goal is to continue with weight loss efforts She has agreed to change back to follow Category 2 plan Katelyn Wilson has been instructed to work up to a goal of 150 minutes of combined cardio and strengthening exercise per week for weight loss and overall health benefits. We discussed the following Behavioral Modification Stratagies today: increasing lean protein intake, decreasing simple carbohydrates  and increasing lower sugar fruits  Katelyn Wilson has agreed to follow up with our clinic in 2 weeks. She was informed of the importance of frequent follow up visits to maximize her success with intensive lifestyle modifications for her multiple health conditions.  I, Doreene Nest, am acting as scribe for Dennard Nip, MD  I have reviewed the above documentation for accuracy and completeness, and I agree with the above. -Dennard Nip, MD

## 2017-02-13 ENCOUNTER — Encounter: Payer: Self-pay | Admitting: Internal Medicine

## 2017-02-16 MED ORDER — HYDROCHLOROTHIAZIDE 25 MG PO TABS: 25.0000 mg | ORAL_TABLET | Freq: Every day | ORAL | 3 refills | Status: DC

## 2017-02-16 MED ORDER — AMLODIPINE BESYLATE 2.5 MG PO TABS: 2.5000 mg | ORAL_TABLET | Freq: Every day | ORAL | 3 refills | Status: DC

## 2017-02-17 ENCOUNTER — Ambulatory Visit (INDEPENDENT_AMBULATORY_CARE_PROVIDER_SITE_OTHER): Payer: 59 | Admitting: Family Medicine

## 2017-02-26 ENCOUNTER — Encounter (INDEPENDENT_AMBULATORY_CARE_PROVIDER_SITE_OTHER): Payer: Self-pay | Admitting: Family Medicine

## 2017-02-26 ENCOUNTER — Ambulatory Visit (INDEPENDENT_AMBULATORY_CARE_PROVIDER_SITE_OTHER): Payer: 59 | Admitting: Family Medicine

## 2017-02-26 VITALS — BP 109/76 | HR 83 | Temp 98.0°F | Resp 14 | Ht 64.0 in | Wt 214.0 lb

## 2017-02-26 DIAGNOSIS — I10 Essential (primary) hypertension: Secondary | ICD-10-CM

## 2017-02-26 DIAGNOSIS — E785 Hyperlipidemia, unspecified: Secondary | ICD-10-CM

## 2017-02-26 DIAGNOSIS — E119 Type 2 diabetes mellitus without complications: Secondary | ICD-10-CM

## 2017-02-26 DIAGNOSIS — E669 Obesity, unspecified: Secondary | ICD-10-CM

## 2017-02-26 DIAGNOSIS — Z6836 Body mass index (BMI) 36.0-36.9, adult: Secondary | ICD-10-CM

## 2017-02-26 MED ORDER — ATORVASTATIN CALCIUM 10 MG PO TABS: 10.0000 mg | ORAL_TABLET | Freq: Every day | ORAL | 0 refills | Status: DC

## 2017-02-26 NOTE — Progress Notes (Signed)
Office: [phone]  /  Fax: [phone]   HPI:   Chief Complaint: OBESITY Katelyn Wilson is here to discuss her progress with her obesity treatment plan. She is following her eating plan approximately 80 % of the time and states she is exercising 0 minutes 0 times per week. Katelyn Wilson is back to losing weight but is still struggling to eat breakfast and struggling to plan meals ahead of time. Her weight is 214 lb (97.1 kg) today and has had a weight loss of 2 pounds over a period of 3 weeks since her last visit. She has lost 0 lbs since starting treatment with [place].  Hypertension Katelyn Wilson is a 41 y.o. female with hypertension.  [name] denies chest pain or shortness of breath on exertion. She is working weight loss to help control her blood pressure with the goal of decreasing her risk of heart attack and stroke. She is doing well on diet and exercise.Tavas blood pressure is currently controlled. Blood pressure is stable on HCTZ and has stopped Amlodipine.   Hyperlipidemia Katelyn Wilson has hyperlipidemia and is currently on Lipitor. She has been trying to improve her cholesterol levels with intensive lifestyle modification including a low saturated fat diet, exercise and weight loss. She denies any chest pain, claudication or myalgias.   Diabetes II Katelyn Wilson has a diagnosis of diabetes type II.Tu states she is not checking blood sugars at home and denies any hypoglycemic episodes. Last A1c was at 5.9 Katelyn Wilson is off Metformin and is working on intensive lifestyle modifications including diet, exercise, and weight loss to help control her blood glucose levels.  Wt Readings from Last 500 Encounters:  02/26/17 214 lb (97.1 kg)  02/03/17 216 lb (98 kg)  01/21/17 213 lb (96.6 kg)  01/05/17 218 lb (98.9 kg)  12/23/16 214 lb (97.1 kg)  11/25/16 221 lb 14.4 oz (100.7 kg)  10/14/16 222 lb 12 oz (101 kg)  08/18/16 222 lb (100.7 kg)  06/13/16 226 lb 4 oz (102.6 kg)  12/05/15 237 lb (107.5 kg)  11/23/15 238 lb  4.8 oz (108.1 kg)  11/12/15 237 lb 8 oz (107.7 kg)  06/06/15 244 lb 4 oz (110.8 kg)  05/03/15 240 lb (108.9 kg)  12/06/14 244 lb 8 oz (110.9 kg)  08/07/14 234 lb (106.1 kg)  03/08/14 229 lb (103.9 kg)  01/27/14 230 lb 8 oz (104.6 kg)  10/21/13 234 lb 4 oz (106.3 kg)  06/22/13 229 lb 4 oz (104 kg)  08/31/12 233 lb 12 oz (106 kg)  06/18/12 232 lb (105.2 kg)  03/02/12 237 lb (107.5 kg)  01/01/12 236 lb (107 kg)  11/14/11 236 lb 4 oz (107.2 kg)  06/18/11 234 lb (106.1 kg)     ALLERGIES: Allergies  Allergen Reactions  . Penicillins     Amoxicillin    MEDICATIONS: Current Outpatient Prescriptions on File Prior to Visit  Medication Sig Dispense Refill  . albuterol (PROVENTIL HFA;VENTOLIN HFA) 108 (90 Base) MCG/ACT inhaler Inhale 2 puffs into the lungs every 6 (six) hours as needed for wheezing or shortness of breath. 1 Inhaler 2  . cetirizine (ZYRTEC) 10 MG tablet Take 10 mg by mouth as needed.      . hydrochlorothiazide (HYDRODIURIL) 25 MG tablet Take 1 tablet (25 mg total) by mouth daily. 90 tablet 3  . Vitamin D, Ergocalciferol, (DRISDOL) 50000 units CAPS capsule Take 1 capsule (50,000 Units total) by mouth 2 (two) times a week. 30 capsule 2   No current facility-administered medications  on file prior to visit.     PAST MEDICAL HISTORY: Past Medical History:  Diagnosis Date  . Acid reflux   . allergic rhinitis   . Anxiety   . Asthma   . Diabetes mellitus   . Hypertension   . Obesity   . Obesity (BMI 30-39.9)   . Prediabetes   . Vitamin D deficiency     PAST SURGICAL HISTORY: Past Surgical History:  Procedure Laterality Date  . none      SOCIAL HISTORY: Social History  Substance Use Topics  . Smoking status: Never Smoker  . Smokeless tobacco: Never Used  . Alcohol use 2.5 oz/week    5 Standard drinks or equivalent per week     Comment: occasional    FAMILY HISTORY: Family History  Problem Relation Age of Onset  . Heart disease Paternal Aunt 26     sudden death  . Heart disease Father     sudden cardiac death  . Diabetes Father   . Hypertension Father   . Sleep apnea Father   . Diabetes    . Lung cancer    . Cancer    . Diabetes Mother   . Sleep apnea Mother   . Obesity Mother   . Cancer Maternal Grandmother   . Cancer Maternal Grandfather     ROS: Review of Systems  Constitutional: Positive for weight loss.  Respiratory: Negative for shortness of breath (on exertion).   Cardiovascular: Negative for chest pain and claudication.  Musculoskeletal: Negative for myalgias.  Endo/Heme/Allergies:       Negative hypoglycemia    PHYSICAL EXAM: Blood pressure 109/76, pulse 83, temperature 98 F (36.7 C), temperature source Oral, resp. rate 14, height 5\' 4"  (1.626 m), weight 214 lb (97.1 kg), last menstrual period 02/17/2017, SpO2 100 %. Body mass index is 36.73 kg/m. Physical Exam  Constitutional: She is oriented to person, place, and time. She appears well-developed and well-nourished.  Cardiovascular: Normal rate.   Pulmonary/Chest: Effort normal.  Musculoskeletal: Normal range of motion.  Neurological: She is oriented to person, place, and time.  Skin: Skin is warm and dry.  Psychiatric: She has a normal mood and affect. Her behavior is normal.  Vitals reviewed.   RECENT LABS AND TESTS: BMET    Component Value Date/Time   NA 137 12/23/2016 1030   K 4.6 12/23/2016 1030   CL 97 12/23/2016 1030   CO2 25 12/23/2016 1030   GLUCOSE 115 (H) 12/23/2016 1030   GLUCOSE 93 06/13/2016 1641   BUN 8 12/23/2016 1030   CREATININE 0.74 12/23/2016 1030   CREATININE 0.64 06/13/2016 1641   CALCIUM 9.4 12/23/2016 1030   GFRNONAA 102 12/23/2016 1030   GFRAA 118 12/23/2016 1030   Lab Results  Component Value Date   HGBA1C 5.9 (H) 12/23/2016   HGBA1C 6.1 (H) 11/25/2016   HGBA1C 6.2 (H) 06/13/2016   HGBA1C 6.5 (H) 11/23/2015   HGBA1C 6.6 (A) 05/08/2015   Lab Results  Component Value Date   INSULIN 22.7 12/23/2016   CBC     Component Value Date/Time   WBC 6.0 12/23/2016 1030   WBC 4.9 12/05/2015 1018   RBC 3.80 12/23/2016 1030   RBC 4.59 12/05/2015 1018   HGB 12.9 12/05/2015 1018   HCT 32.6 (L) 12/23/2016 1030   PLT 376.0 12/05/2015 1018   MCV 86 12/23/2016 1030   MCH 28.7 12/23/2016 1030   MCHC 33.4 12/23/2016 1030   MCHC 32.2 12/05/2015 1018   RDW 14.0 12/23/2016  1030   LYMPHSABS 1.5 12/23/2016 1030   MONOABS 0.3 12/05/2015 1018   EOSABS 0.3 12/23/2016 1030   BASOSABS 0.0 12/23/2016 1030   Iron/TIBC/Ferritin/ %Sat No results found for: IRON, TIBC, FERRITIN, IRONPCTSAT Lipid Panel     Component Value Date/Time   CHOL 169 12/23/2016 1030   TRIG 63 12/23/2016 1030   HDL 42 12/23/2016 1030   CHOLHDL 3.7 11/25/2016 1606   CHOLHDL 3.2 06/13/2016 1641   VLDL 15 06/13/2016 1641   LDLCALC 114 (H) 12/23/2016 1030   LDLDIRECT 118 06/13/2016 1641   Hepatic Function Panel     Component Value Date/Time   PROT 7.9 12/23/2016 1030   ALBUMIN 4.0 12/23/2016 1030   AST 14 12/23/2016 1030   ALT 12 12/23/2016 1030   ALKPHOS 80 12/23/2016 1030   BILITOT 0.3 12/23/2016 1030      Component Value Date/Time   TSH 1.070 12/23/2016 1030   TSH 1.920 11/25/2016 1606   TSH 1.38 03/02/2015   TSH 1.010 12/09/2013 1004    ASSESSMENT AND PLAN: Essential hypertension  Type 2 diabetes mellitus without complication, without long-term current use of insulin ([facility])  Hyperlipidemia, unspecified hyperlipidemia type - Plan: atorvastatin (LIPITOR) 10 MG tablet  Class 2 obesity without serious comorbidity with body mass index (BMI) of 36.0 to 36.9 in adult, unspecified obesity type  PLAN:  Hypertension We discussed sodium restriction, working on healthy weight loss, and a regular exercise program as the means to achieve improved blood pressure control. Annalynn agreed with this plan and agreed to follow up as directed. We will continue to monitor her blood pressure as well as her progress with the above lifestyle  modifications. She will continue HCTZ as prescribed and will watch for signs of hypotension as she continues her lifestyle modifications. She agrees to follow up with our clinic in 2 weeks.  Hyperlipidemia Rehmat was informed of the American Heart Association Guidelines emphasizing intensive lifestyle modifications as the first line treatment for hyperlipidemia. We discussed many lifestyle modifications today in depth, and Carlei will continue to work on decreasing saturated fats such as fatty red meat, butter and many fried foods. She will also increase vegetables and lean protein in her diet and continue to work on exercise and weight loss efforts. We will refill Lipitor 10 mg every night #30 with no refills and will re-check labs in 1 month. She agrees to follow up with our clinic in 2 weeks.  Diabetes II Jazman has been given extensive diabetes education by myself today including ideal fasting and post-prandial blood glucose readings, individual ideal Hgb A1c goals  and hypoglycemia prevention. We discussed the importance of good blood sugar control to decrease the likelihood of diabetic complications such as nephropathy, neuropathy, limb loss, blindness, coronary artery disease, and death. We discussed the importance of intensive lifestyle modification including diet, exercise and weight loss as the first line treatment for diabetes. Elease agrees to continue her diabetes medications and will follow up at the agreed upon time.  Obesity Arnisha is currently in the action stage of change. As such, her goal is to continue with weight loss efforts She has agreed to follow the Category 2 plan, we discussed it's okay to substitute a premier protein shake for breakfast. Peytin has been instructed to work up to a goal of 150 minutes of combined cardio and strengthening exercise per week or start cardio 15 minutes 3 times per week for weight loss and overall health benefits. We discussed the following Behavioral  Modification  Stratagies today: no skipping meals, increasing lean protein intake, decreasing simple carbohydrates,  increasing vegetables and work on meal planning and easy cooking plans  Danayah has agreed to follow up with our clinic in 2 weeks. She was informed of the importance of frequent follow up visits to maximize her success with intensive lifestyle modifications for her multiple health conditions.  I, Doreene Nest, am acting as scribe for Dennard Nip, MD  I have reviewed the above documentation for accuracy and completeness, and I agree with the above. -Dennard Nip, MD

## 2017-03-12 ENCOUNTER — Ambulatory Visit (INDEPENDENT_AMBULATORY_CARE_PROVIDER_SITE_OTHER): Payer: 59 | Admitting: Family Medicine

## 2017-03-18 ENCOUNTER — Ambulatory Visit (INDEPENDENT_AMBULATORY_CARE_PROVIDER_SITE_OTHER): Payer: 59 | Admitting: Family

## 2017-03-18 ENCOUNTER — Encounter: Payer: Self-pay | Admitting: Family

## 2017-03-18 VITALS — BP 122/86 | HR 81 | Temp 98.1°F | Ht 64.0 in | Wt 222.4 lb

## 2017-03-18 DIAGNOSIS — S0300XA Dislocation of jaw, unspecified side, initial encounter: Secondary | ICD-10-CM | POA: Diagnosis not present

## 2017-03-18 DIAGNOSIS — H6982 Other specified disorders of Eustachian tube, left ear: Secondary | ICD-10-CM

## 2017-03-18 MED ORDER — PREDNISONE 10 MG PO TABS: ORAL_TABLET | ORAL | 0 refills | Status: DC

## 2017-03-18 NOTE — Progress Notes (Signed)
Subjective:    Patient ID: Katelyn Wilson, female    DOB: 19-Jul-1976, 41 y.o.   MRN: [id number]  CC: Katelyn Wilson is a 41 y.o. female who presents today for an acute visit.    HPI: CC: left ear pain, intermittent, 6 days.   Only happens when opens her mouth. Feels jaw 'creeking' and especially when eating. Hasn't noticed any particular foods that aggravate it.  Tried sweet oil with some relief.  Wears retainers at night; unaware of grinding.   Endorses post nasal drip.   No sinus pressure, fever, cough, sob  h/o asthma and seasonal allergies      HISTORY:  Past Medical History:  Diagnosis Date  . Acid reflux   . allergic rhinitis   . Anxiety   . Asthma   . Diabetes mellitus   . Hypertension   . Obesity   . Obesity (BMI 30-39.9)   . Prediabetes   . Vitamin D deficiency    Past Surgical History:  Procedure Laterality Date  . none     Family History  Problem Relation Age of Onset  . Heart disease Paternal Aunt 67    sudden death  . Heart disease Father     sudden cardiac death  . Diabetes Father   . Hypertension Father   . Sleep apnea Father   . Diabetes    . Lung cancer    . Cancer    . Diabetes Mother   . Sleep apnea Mother   . Obesity Mother   . Cancer Maternal Grandmother   . Cancer Maternal Grandfather     Allergies: Penicillins Current Outpatient Prescriptions on File Prior to Visit  Medication Sig Dispense Refill  . albuterol (PROVENTIL HFA;VENTOLIN HFA) 108 (90 Base) MCG/ACT inhaler Inhale 2 puffs into the lungs every 6 (six) hours as needed for wheezing or shortness of breath. 1 Inhaler 2  . atorvastatin (LIPITOR) 10 MG tablet Take 1 tablet (10 mg total) by mouth at bedtime. 30 tablet 0  . cetirizine (ZYRTEC) 10 MG tablet Take 10 mg by mouth as needed.      . hydrochlorothiazide (HYDRODIURIL) 25 MG tablet Take 1 tablet (25 mg total) by mouth daily. 90 tablet 3  . Vitamin D, Ergocalciferol, (DRISDOL) 50000 units CAPS capsule Take 1 capsule  (50,000 Units total) by mouth 2 (two) times a week. 30 capsule 2   No current facility-administered medications on file prior to visit.     Social History  Substance Use Topics  . Smoking status: Never Smoker  . Smokeless tobacco: Never Used  . Alcohol use 2.5 oz/week    5 Standard drinks or equivalent per week     Comment: occasional    Review of Systems  Constitutional: Negative for chills and fever.  HENT: Positive for ear pain, postnasal drip and sore throat. Negative for congestion, ear discharge, hearing loss and trouble swallowing.   Respiratory: Negative for cough, shortness of breath and wheezing.   Cardiovascular: Negative for chest pain and palpitations.  Gastrointestinal: Negative for nausea and vomiting.      Objective:    BP 122/86 (BP Location: Left Arm, Cuff Size: Large)   Pulse 81   Temp 98.1 F (36.7 C) (Oral)   Ht 5\' 4"  (1.626 m)   Wt 222 lb 6.4 oz (100.9 kg)   LMP 02/17/2017   SpO2 99%   BMI 38.17 kg/m  BP Readings from Last 3 Encounters:  03/18/17 122/86  02/26/17 109/76  02/03/17  104/71     Physical Exam  Constitutional: She appears well-developed and well-nourished.  HENT:  Head: Normocephalic and atraumatic.  Right Ear: Hearing, tympanic membrane, external ear and ear canal normal. No drainage, swelling or tenderness. No foreign bodies. Tympanic membrane is not erythematous and not bulging. No middle ear effusion. No decreased hearing is noted.  Left Ear: Hearing, external ear and ear canal normal. No drainage, swelling or tenderness. No foreign bodies. Tympanic membrane is bulging. Tympanic membrane is not erythematous.  No middle ear effusion. No decreased hearing is noted.  Nose: Nose normal. No rhinorrhea. Right sinus exhibits no maxillary sinus tenderness and no frontal sinus tenderness. Left sinus exhibits no maxillary sinus tenderness and no frontal sinus tenderness.  Mouth/Throat: Uvula is midline, oropharynx is clear and moist and  mucous membranes are normal. No oropharyngeal exudate, posterior oropharyngeal edema, posterior oropharyngeal erythema or tonsillar abscesses.  Crepitus felt with palpation and when asked to open/close and side to side movements of left TMJ. Normal ROM.   No facial erythema, increased warmth.   Eyes: Conjunctivae are normal.  Cardiovascular: Regular rhythm, normal heart sounds and normal pulses.   Pulmonary/Chest: Effort normal and breath sounds normal. She has no wheezes. She has no rhonchi. She has no rales.  Lymphadenopathy:       Head (right side): No submental, no submandibular, no tonsillar, no preauricular, no posterior auricular and no occipital adenopathy present.       Head (left side): No submental, no submandibular, no tonsillar, no preauricular, no posterior auricular and no occipital adenopathy present.    She has no cervical adenopathy.  Neurological: She is alert.  Skin: Skin is warm and dry.  Psychiatric: She has a normal mood and affect. Her speech is normal and behavior is normal. Thought content normal.  Vitals reviewed.      Assessment & Plan:  1. Eustachian tube dysfunction, left Symptomsconsistent with eustachian tube dysfunction. Offered Flonase however patient prefers oral prednisone which is appropriate. 4 days prednisone. Stay on antihistamine.  - predniSONE (DELTASONE) 10 MG tablet; Take 40 mg by mouth on day 1, then taper 10 mg daily until gone  Dispense: 10 tablet; Refill: 0  2. Dislocation of temporomandibular joint, initial encounter Symptoms consistent with TMJ. Discussed follow-up with dentist as patient may be grinding her teeth at night. Also discussed foods which may exacerbate symptom. Advised anti-inflammatories. Return precautions given.     I am having Ms. Riege start on predniSONE. I am also having her maintain her cetirizine, albuterol, Vitamin D (Ergocalciferol), hydrochlorothiazide, and atorvastatin.   Meds ordered this encounter    Medications  . predniSONE (DELTASONE) 10 MG tablet    Sig: Take 40 mg by mouth on day 1, then taper 10 mg daily until gone    Dispense:  10 tablet    Refill:  0    Order Specific Question:   Supervising Provider    Answer:   Crecencio Mc [[id number]]    Return precautions given.   Risks, benefits, and alternatives of the medications and treatment plan prescribed today were discussed, and patient expressed understanding.   Education regarding symptom management and diagnosis given to patient on AVS.  Continue to follow with TULLO, Aris Everts, MD for routine health maintenance.   Ellamarie L Gunby and I agreed with plan.   Mable Paris, FNP

## 2017-03-18 NOTE — Patient Instructions (Signed)
Prednisone taper  Suspect eustachian tube dysfunction and TMJ dysfunction  Follow up with dentist as may also be grinding teeth  Let me know if not better    Temporomandibular Joint Syndrome Temporomandibular joint (TMJ) syndrome is a condition that affects the joints between your jaw and your skull. The TMJs are located near your ears and allow your jaw to open and close. These joints and the nearby muscles are involved in all movements of the jaw. People with TMJ syndrome have pain in the area of these joints and muscles. Chewing, biting, or other movements of the jaw can be difficult or painful. TMJ syndrome can be caused by various things. In many cases, the condition is mild and goes away within a few weeks. For some people, the condition can become a long-term problem. What are the causes? Possible causes of TMJ syndrome include:  Grinding your teeth or clenching your jaw. Some people do this when they are under stress.  Arthritis.  Injury to the jaw.  Head or neck injury.  Teeth or dentures that are not aligned well. In some cases, the cause of TMJ syndrome may not be known. What are the signs or symptoms? The most common symptom is an aching pain on the side of the head in the area of the TMJ. Other symptoms may include:  Pain when moving your jaw, such as when chewing or biting.  Being unable to open your jaw all the way.  Making a clicking sound when you open your mouth.  Headache.  Earache.  Neck or shoulder pain. How is this diagnosed? Diagnosis can usually be made based on your symptoms, your medical history, and a physical exam. Your health care provider may check the range of motion of your jaw. Imaging tests, such as X-rays or an MRI, are sometimes done. You may need to see your dentist to determine if your teeth and jaw are lined up correctly. How is this treated? TMJ syndrome often goes away on its own. If treatment is needed, the options may  include:  Eating soft foods and applying ice or heat.  Medicines to relieve pain or inflammation.  Medicines to relax the muscles.  A splint, bite plate, or mouthpiece to prevent teeth grinding or jaw clenching.  Relaxation techniques or counseling to help reduce stress.  Transcutaneous electrical nerve stimulation (TENS). This helps to relieve pain by applying an electrical current through the skin.  Acupuncture. This is sometimes helpful to relieve pain.  Jaw surgery. This is rarely needed. Follow these instructions at home:  Take medicines only as directed by your health care provider.  Eat a soft diet if you are having trouble chewing.  Apply ice to the painful area.  Put ice in a plastic bag.  Place a towel between your skin and the bag.  Leave the ice on for 20 minutes, 2-3 times a day.  Apply a warm compress to the painful area as directed.  Massage your jaw area and perform any jaw stretching exercises as recommended by your health care provider.  If you were given a mouthpiece or bite plate, wear it as directed.  Avoid foods that require a lot of chewing. Do not chew gum.  Keep all follow-up visits as directed by your health care provider. This is important. Contact a health care provider if:  You are having trouble eating.  You have new or worsening symptoms. Get help right away if:  Your jaw locks open or closed. This information  is not intended to replace advice given to you by your health care provider. Make sure you discuss any questions you have with your health care provider. Document Released: 08/19/2001 Document Revised: 07/24/2016 Document Reviewed: 06/29/2014 Elsevier Interactive Patient Education  2017 [employer].

## 2017-03-18 NOTE — Progress Notes (Signed)
Pre visit review using our clinic review tool, if applicable. No additional management support is needed unless otherwise documented below in the visit note. 

## 2017-03-19 ENCOUNTER — Ambulatory Visit (INDEPENDENT_AMBULATORY_CARE_PROVIDER_SITE_OTHER): Payer: 59 | Admitting: Family Medicine

## 2017-03-19 VITALS — BP 120/78 | HR 90 | Temp 97.9°F | Ht 64.0 in | Wt 215.0 lb

## 2017-03-19 DIAGNOSIS — Z6836 Body mass index (BMI) 36.0-36.9, adult: Secondary | ICD-10-CM

## 2017-03-19 DIAGNOSIS — F3289 Other specified depressive episodes: Secondary | ICD-10-CM | POA: Diagnosis not present

## 2017-03-19 DIAGNOSIS — E669 Obesity, unspecified: Secondary | ICD-10-CM

## 2017-03-19 DIAGNOSIS — E119 Type 2 diabetes mellitus without complications: Secondary | ICD-10-CM

## 2017-03-19 MED ORDER — BUPROPION HCL ER (SR) 150 MG PO TB12: 150.0000 mg | ORAL_TABLET | Freq: Every day | ORAL | 0 refills | Status: DC

## 2017-03-19 NOTE — Progress Notes (Signed)
Office: [phone]  /  Fax: [phone]   HPI:   Chief Complaint: OBESITY Katelyn Wilson is here to discuss her progress with her obesity treatment plan. She is following her eating plan approximately 50 % of the time and states she is exercising 15 minutes 3 times per week. Katelyn Wilson recently started Prednisone and notes increased cravings and irritability. She did well maintaining weight with a little fluid retention. Her weight is 215 lb (97.5 kg) today and has had a weight gain of 1 lb over a period of 3 weeks since her last visit. She has gained 1 lb since starting treatment with [place].  Other Depression  Katelyn Wilson is struggling with increased irritability, insomnia and emotional/stress eating, worse since starting prednisone earlier this week. She has been working on behavior modification techniques to help reduce her emotional eating and has been somewhat successful. She shows no sign of suicidal or homicidal ideations.  Diabetes II Katelyn Wilson has a diagnosis of diabetes type II. Katelyn Wilson states she does not check sugars at home and denies any hypoglycemic episodes. Last A1c was at 5.9 Katelyn Wilson is controlling with diet, she did not tolerate metformin and declined starting on other diabetes medications.  Depression screen [facility] 2/9 03/18/2017 12/23/2016 08/18/2016 12/06/2014  Decreased Interest 0 0 0 0  Down, Depressed, Hopeless 0 1 0 0  PHQ - 2 Score 0 1 0 0  Altered sleeping - 0 - -  Tired, decreased energy - 1 - -  Change in appetite - 1 - -  Feeling bad or failure about yourself  - 0 - -  Trouble concentrating - 0 - -  Moving slowly or fidgety/restless - 0 - -  Suicidal thoughts - 0 - -  PHQ-9 Score - 3 - -     Wt Readings from Last 500 Encounters:  03/19/17 215 lb (97.5 kg)  03/18/17 222 lb 6.4 oz (100.9 kg)  02/26/17 214 lb (97.1 kg)  02/03/17 216 lb (98 kg)  01/21/17 213 lb (96.6 kg)  01/05/17 218 lb (98.9 kg)  12/23/16 214 lb (97.1 kg)  11/25/16 221 lb 14.4 oz (100.7 kg)  10/14/16 222 lb 12 oz  (101 kg)  08/18/16 222 lb (100.7 kg)  06/13/16 226 lb 4 oz (102.6 kg)  12/05/15 237 lb (107.5 kg)  11/23/15 238 lb 4.8 oz (108.1 kg)  11/12/15 237 lb 8 oz (107.7 kg)  06/06/15 244 lb 4 oz (110.8 kg)  05/03/15 240 lb (108.9 kg)  12/06/14 244 lb 8 oz (110.9 kg)  08/07/14 234 lb (106.1 kg)  03/08/14 229 lb (103.9 kg)  01/27/14 230 lb 8 oz (104.6 kg)  10/21/13 234 lb 4 oz (106.3 kg)  06/22/13 229 lb 4 oz (104 kg)  08/31/12 233 lb 12 oz (106 kg)  06/18/12 232 lb (105.2 kg)  03/02/12 237 lb (107.5 kg)  01/01/12 236 lb (107 kg)  11/14/11 236 lb 4 oz (107.2 kg)  06/18/11 234 lb (106.1 kg)     ALLERGIES: Allergies  Allergen Reactions  . Penicillins     Amoxicillin    MEDICATIONS: Current Outpatient Prescriptions on File Prior to Visit  Medication Sig Dispense Refill  . albuterol (PROVENTIL HFA;VENTOLIN HFA) 108 (90 Base) MCG/ACT inhaler Inhale 2 puffs into the lungs every 6 (six) hours as needed for wheezing or shortness of breath. 1 Inhaler 2  . atorvastatin (LIPITOR) 10 MG tablet Take 1 tablet (10 mg total) by mouth at bedtime. 30 tablet 0  . cetirizine (ZYRTEC) 10 MG tablet  Take 10 mg by mouth as needed.      . hydrochlorothiazide (HYDRODIURIL) 25 MG tablet Take 1 tablet (25 mg total) by mouth daily. 90 tablet 3  . predniSONE (DELTASONE) 10 MG tablet Take 40 mg by mouth on day 1, then taper 10 mg daily until gone 10 tablet 0  . Vitamin D, Ergocalciferol, (DRISDOL) 50000 units CAPS capsule Take 1 capsule (50,000 Units total) by mouth 2 (two) times a week. 30 capsule 2   No current facility-administered medications on file prior to visit.     PAST MEDICAL HISTORY: Past Medical History:  Diagnosis Date  . Acid reflux   . allergic rhinitis   . Anxiety   . Asthma   . Diabetes mellitus   . Hypertension   . Obesity   . Obesity (BMI 30-39.9)   . Prediabetes   . Vitamin D deficiency     PAST SURGICAL HISTORY: Past Surgical History:  Procedure Laterality Date  . none       SOCIAL HISTORY: Social History  Substance Use Topics  . Smoking status: Never Smoker  . Smokeless tobacco: Never Used  . Alcohol use 2.5 oz/week    5 Standard drinks or equivalent per week     Comment: occasional    FAMILY HISTORY: Family History  Problem Relation Age of Onset  . Heart disease Paternal Aunt 78    sudden death  . Heart disease Father     sudden cardiac death  . Diabetes Father   . Hypertension Father   . Sleep apnea Father   . Diabetes    . Lung cancer    . Cancer    . Diabetes Mother   . Sleep apnea Mother   . Obesity Mother   . Cancer Maternal Grandmother   . Cancer Maternal Grandfather     ROS: Review of Systems  Constitutional: Negative for weight loss.  Endo/Heme/Allergies:       Polyphagia Negative hypoglycemia  Psychiatric/Behavioral: Positive for depression. Negative for suicidal ideas. The patient has insomnia.        Irritability     PHYSICAL EXAM: Blood pressure 120/78, pulse 90, temperature 97.9 F (36.6 C), temperature source Oral, height 5\' 4"  (1.626 m), weight 215 lb (97.5 kg), last menstrual period 02/15/2017, SpO2 98 %. Body mass index is 36.9 kg/m. Physical Exam  Constitutional: She is oriented to person, place, and time. She appears well-developed and well-nourished.  Cardiovascular: Normal rate.   Pulmonary/Chest: Effort normal.  Musculoskeletal: Normal range of motion.  Neurological: She is oriented to person, place, and time.  Skin: Skin is warm and dry.  Vitals reviewed.   RECENT LABS AND TESTS: BMET    Component Value Date/Time   NA 137 12/23/2016 1030   K 4.6 12/23/2016 1030   CL 97 12/23/2016 1030   CO2 25 12/23/2016 1030   GLUCOSE 115 (H) 12/23/2016 1030   GLUCOSE 93 06/13/2016 1641   BUN 8 12/23/2016 1030   CREATININE 0.74 12/23/2016 1030   CREATININE 0.64 06/13/2016 1641   CALCIUM 9.4 12/23/2016 1030   GFRNONAA 102 12/23/2016 1030   GFRAA 118 12/23/2016 1030   Lab Results  Component Value Date    HGBA1C 5.9 (H) 12/23/2016   HGBA1C 6.1 (H) 11/25/2016   HGBA1C 6.2 (H) 06/13/2016   HGBA1C 6.5 (H) 11/23/2015   HGBA1C 6.6 (A) 05/08/2015   Lab Results  Component Value Date   INSULIN 22.7 12/23/2016   CBC    Component Value Date/Time  WBC 6.0 12/23/2016 1030   WBC 4.9 12/05/2015 1018   RBC 3.80 12/23/2016 1030   RBC 4.59 12/05/2015 1018   HGB 12.9 12/05/2015 1018   HCT 32.6 (L) 12/23/2016 1030   PLT 376.0 12/05/2015 1018   MCV 86 12/23/2016 1030   MCH 28.7 12/23/2016 1030   MCHC 33.4 12/23/2016 1030   MCHC 32.2 12/05/2015 1018   RDW 14.0 12/23/2016 1030   LYMPHSABS 1.5 12/23/2016 1030   MONOABS 0.3 12/05/2015 1018   EOSABS 0.3 12/23/2016 1030   BASOSABS 0.0 12/23/2016 1030   Iron/TIBC/Ferritin/ %Sat No results found for: IRON, TIBC, FERRITIN, IRONPCTSAT Lipid Panel     Component Value Date/Time   CHOL 169 12/23/2016 1030   TRIG 63 12/23/2016 1030   HDL 42 12/23/2016 1030   CHOLHDL 3.7 11/25/2016 1606   CHOLHDL 3.2 06/13/2016 1641   VLDL 15 06/13/2016 1641   LDLCALC 114 (H) 12/23/2016 1030   LDLDIRECT 118 06/13/2016 1641   Hepatic Function Panel     Component Value Date/Time   PROT 7.9 12/23/2016 1030   ALBUMIN 4.0 12/23/2016 1030   AST 14 12/23/2016 1030   ALT 12 12/23/2016 1030   ALKPHOS 80 12/23/2016 1030   BILITOT 0.3 12/23/2016 1030      Component Value Date/Time   TSH 1.070 12/23/2016 1030   TSH 1.920 11/25/2016 1606   TSH 1.38 03/02/2015   TSH 1.010 12/09/2013 1004    ASSESSMENT AND PLAN: Other depression - Plan: buPROPion (WELLBUTRIN SR) 150 MG 12 hr tablet  Type 2 diabetes mellitus without complication, without long-term current use of insulin (HCC)  Class 2 obesity without serious comorbidity with body mass index (BMI) of 36.0 to 36.9 in adult, unspecified obesity type  PLAN:  Other Depression  We discussed behavior modification techniques today to help Idaly deal with her emotional eating and depression. She has agreed to start to  take Wellbutrin SR 150 mg qd #30 with no refills and agreed to follow up as directed.  Diabetes II Marley has been given extensive diabetes education by myself today including ideal fasting and post-prandial blood glucose readings, individual ideal Hgb A1c goals and hypoglycemia prevention. We discussed the importance of good blood sugar control to decrease the likelihood of diabetic complications such as nephropathy, neuropathy, limb loss, blindness, coronary artery disease, and death. We discussed the importance of intensive lifestyle modification including diet, exercise and weight loss as the first line treatment for diabetes. Anum agrees to continue her diabetes medications and will follow up at the agreed upon time. We will re-check labs in 3 months.  Obesity Izabelle is currently in the action stage of change. As such, her goal is to continue with weight loss efforts She has agreed to keep a food journal with 350 to 450 calories and 25+ grams of protein at lunch daily and follow the Category 2 plan Shonita has been instructed to work up to a goal of 150 minutes of combined cardio and strengthening exercise per week for weight loss and overall health benefits. We discussed the following Behavioral Modification Stratagies today: increasing lean protein intake, decreasing simple carbohydrates , emotional eating strategies and ways to avoid boredom eating  Leilyn has agreed to follow up with our clinic in 3 weeks. She was informed of the importance of frequent follow up visits to maximize her success with intensive lifestyle modifications for her multiple health conditions.  Corey Skains, am acting as scribe for Dennard Nip, MD  I have reviewed the  above documentation for accuracy and completeness, and I agree with the above. -Dennard Nip, MD

## 2017-04-07 ENCOUNTER — Ambulatory Visit (INDEPENDENT_AMBULATORY_CARE_PROVIDER_SITE_OTHER): Payer: 59 | Admitting: Family Medicine

## 2017-04-07 VITALS — BP 111/78 | HR 84 | Temp 97.9°F | Ht 64.0 in | Wt 217.0 lb

## 2017-04-07 DIAGNOSIS — E669 Obesity, unspecified: Secondary | ICD-10-CM | POA: Diagnosis not present

## 2017-04-07 DIAGNOSIS — Z6837 Body mass index (BMI) 37.0-37.9, adult: Secondary | ICD-10-CM | POA: Diagnosis not present

## 2017-04-07 DIAGNOSIS — F3289 Other specified depressive episodes: Secondary | ICD-10-CM

## 2017-04-07 DIAGNOSIS — E66812 Obesity, class 2: Secondary | ICD-10-CM

## 2017-04-07 NOTE — Progress Notes (Signed)
Office: [phone]  /  Fax: [phone]   HPI:   Chief Complaint: OBESITY Katelyn Wilson is here to discuss her progress with her obesity treatment plan. She is on the keep a food journal 25 grams of protein at lunch daily and follow the Category 2 plan and is following her eating plan approximately 70 % of the time. She states she is walking 30 minutes 2 times per week. Katelyn Wilson is retaining fluid today, has been on prednisone recently. She notes increased irritability and stress eating. She is off track in the last week but is ready to get back on track. She would like to journal all day. Her weight is 217 lb (98.4 kg) today and has had a weight gain of 2 lbs over a period of 2 to 3 weeks since her last visit. She has gained 3 lbs since starting treatment with [place].  Depression with emotional eating behaviors Katelyn Wilson is struggling with emotional eating and using food for comfort to the extent that it is negatively impacting her health. She often snacks when she is not hungry. Katelyn Wilson sometimes feels she is out of control and then feels guilty that she made poor food choices. She started wellbutrin and prednisone and felt she was more irritable so she stopped. She states she really doesn't want to take medication if she can help it. Her mood is stable. She has been working on behavior modification techniques to help reduce her emotional eating and has been somewhat successful. She still notes stress eating. She shows no sign of suicidal or homicidal ideations.  Depression screen [facility] 2/9 03/18/2017 12/23/2016 08/18/2016 12/06/2014  Decreased Interest 0 0 0 0  Down, Depressed, Hopeless 0 1 0 0  PHQ - 2 Score 0 1 0 0  Altered sleeping - 0 - -  Tired, decreased energy - 1 - -  Change in appetite - 1 - -  Feeling bad or failure about yourself  - 0 - -  Trouble concentrating - 0 - -  Moving slowly or fidgety/restless - 0 - -  Suicidal thoughts - 0 - -  PHQ-9 Score - 3 - -     Wt Readings from Last 500  Encounters:  04/07/17 217 lb (98.4 kg)  03/19/17 215 lb (97.5 kg)  03/18/17 222 lb 6.4 oz (100.9 kg)  02/26/17 214 lb (97.1 kg)  02/03/17 216 lb (98 kg)  01/21/17 213 lb (96.6 kg)  01/05/17 218 lb (98.9 kg)  12/23/16 214 lb (97.1 kg)  11/25/16 221 lb 14.4 oz (100.7 kg)  10/14/16 222 lb 12 oz (101 kg)  08/18/16 222 lb (100.7 kg)  06/13/16 226 lb 4 oz (102.6 kg)  12/05/15 237 lb (107.5 kg)  11/23/15 238 lb 4.8 oz (108.1 kg)  11/12/15 237 lb 8 oz (107.7 kg)  06/06/15 244 lb 4 oz (110.8 kg)  05/03/15 240 lb (108.9 kg)  12/06/14 244 lb 8 oz (110.9 kg)  08/07/14 234 lb (106.1 kg)  03/08/14 229 lb (103.9 kg)  01/27/14 230 lb 8 oz (104.6 kg)  10/21/13 234 lb 4 oz (106.3 kg)  06/22/13 229 lb 4 oz (104 kg)  08/31/12 233 lb 12 oz (106 kg)  06/18/12 232 lb (105.2 kg)  03/02/12 237 lb (107.5 kg)  01/01/12 236 lb (107 kg)  11/14/11 236 lb 4 oz (107.2 kg)  06/18/11 234 lb (106.1 kg)     ALLERGIES: Allergies  Allergen Reactions  . Penicillins     Amoxicillin    MEDICATIONS: Current  Outpatient Prescriptions on File Prior to Visit  Medication Sig Dispense Refill  . albuterol (PROVENTIL HFA;VENTOLIN HFA) 108 (90 Base) MCG/ACT inhaler Inhale 2 puffs into the lungs every 6 (six) hours as needed for wheezing or shortness of breath. 1 Inhaler 2  . atorvastatin (LIPITOR) 10 MG tablet Take 1 tablet (10 mg total) by mouth at bedtime. 30 tablet 0  . cetirizine (ZYRTEC) 10 MG tablet Take 10 mg by mouth as needed.      . hydrochlorothiazide (HYDRODIURIL) 25 MG tablet Take 1 tablet (25 mg total) by mouth daily. 90 tablet 3  . predniSONE (DELTASONE) 10 MG tablet Take 40 mg by mouth on day 1, then taper 10 mg daily until gone 10 tablet 0  . Vitamin D, Ergocalciferol, (DRISDOL) 50000 units CAPS capsule Take 1 capsule (50,000 Units total) by mouth 2 (two) times a week. 30 capsule 2   No current facility-administered medications on file prior to visit.     PAST MEDICAL HISTORY: Past Medical  History:  Diagnosis Date  . Acid reflux   . allergic rhinitis   . Anxiety   . Asthma   . Diabetes mellitus   . Hypertension   . Obesity   . Obesity (BMI 30-39.9)   . Prediabetes   . Vitamin D deficiency     PAST SURGICAL HISTORY: Past Surgical History:  Procedure Laterality Date  . none      SOCIAL HISTORY: Social History  Substance Use Topics  . Smoking status: Never Smoker  . Smokeless tobacco: Never Used  . Alcohol use 2.5 oz/week    5 Standard drinks or equivalent per week     Comment: occasional    FAMILY HISTORY: Family History  Problem Relation Age of Onset  . Heart disease Paternal Aunt 30    sudden death  . Heart disease Father     sudden cardiac death  . Diabetes Father   . Hypertension Father   . Sleep apnea Father   . Diabetes    . Lung cancer    . Cancer    . Diabetes Mother   . Sleep apnea Mother   . Obesity Mother   . Cancer Maternal Grandmother   . Cancer Maternal Grandfather     ROS: Review of Systems  Constitutional: Negative for weight loss.  Psychiatric/Behavioral: Positive for depression. Negative for suicidal ideas.       Irritability stress    PHYSICAL EXAM: Blood pressure 111/78, pulse 84, temperature 97.9 F (36.6 C), temperature source Oral, height 5\' 4"  (1.626 m), weight 217 lb (98.4 kg), SpO2 100 %. Body mass index is 37.25 kg/m. Physical Exam  RECENT LABS AND TESTS: BMET    Component Value Date/Time   NA 137 12/23/2016 1030   K 4.6 12/23/2016 1030   CL 97 12/23/2016 1030   CO2 25 12/23/2016 1030   GLUCOSE 115 (H) 12/23/2016 1030   GLUCOSE 93 06/13/2016 1641   BUN 8 12/23/2016 1030   CREATININE 0.74 12/23/2016 1030   CREATININE 0.64 06/13/2016 1641   CALCIUM 9.4 12/23/2016 1030   GFRNONAA 102 12/23/2016 1030   GFRAA 118 12/23/2016 1030   Lab Results  Component Value Date   HGBA1C 5.9 (H) 12/23/2016   HGBA1C 6.1 (H) 11/25/2016   HGBA1C 6.2 (H) 06/13/2016   HGBA1C 6.5 (H) 11/23/2015   HGBA1C 6.6 (A)  05/08/2015   Lab Results  Component Value Date   INSULIN 22.7 12/23/2016   CBC    Component Value Date/Time  WBC 6.0 12/23/2016 1030   WBC 4.9 12/05/2015 1018   RBC 3.80 12/23/2016 1030   RBC 4.59 12/05/2015 1018   HGB 12.9 12/05/2015 1018   HCT 32.6 (L) 12/23/2016 1030   PLT 376.0 12/05/2015 1018   MCV 86 12/23/2016 1030   MCH 28.7 12/23/2016 1030   MCHC 33.4 12/23/2016 1030   MCHC 32.2 12/05/2015 1018   RDW 14.0 12/23/2016 1030   LYMPHSABS 1.5 12/23/2016 1030   MONOABS 0.3 12/05/2015 1018   EOSABS 0.3 12/23/2016 1030   BASOSABS 0.0 12/23/2016 1030   Iron/TIBC/Ferritin/ %Sat No results found for: IRON, TIBC, FERRITIN, IRONPCTSAT Lipid Panel     Component Value Date/Time   CHOL 169 12/23/2016 1030   TRIG 63 12/23/2016 1030   HDL 42 12/23/2016 1030   CHOLHDL 3.7 11/25/2016 1606   CHOLHDL 3.2 06/13/2016 1641   VLDL 15 06/13/2016 1641   LDLCALC 114 (H) 12/23/2016 1030   LDLDIRECT 118 06/13/2016 1641   Hepatic Function Panel     Component Value Date/Time   PROT 7.9 12/23/2016 1030   ALBUMIN 4.0 12/23/2016 1030   AST 14 12/23/2016 1030   ALT 12 12/23/2016 1030   ALKPHOS 80 12/23/2016 1030   BILITOT 0.3 12/23/2016 1030      Component Value Date/Time   TSH 1.070 12/23/2016 1030   TSH 1.920 11/25/2016 1606   TSH 1.38 03/02/2015   TSH 1.010 12/09/2013 1004    ASSESSMENT AND PLAN: Other depression  Class 2 obesity without serious comorbidity with body mass index (BMI) of 37.0 to 37.9 in adult, unspecified obesity type  PLAN:  Depression with Emotional Eating Behaviors Katelyn Wilson agrees to discontinue wellbutrin. We discussed behavior modification techniques today to help Betrice decrease her emotional eating and depression. She has agreed to follow up as directed.  We spent > than 50% of the 15 minute visit on the counseling as documented in the note.  Obesity Katelyn Wilson is currently in the action stage of change. As such, her goal is to continue with weight loss  efforts She has agreed to keep a food journal with 1100 to 1300 calories and 75+ grams protein daily Harpreet has been instructed to work up to a goal of 150 minutes of combined cardio and strengthening exercise per week for weight loss and overall health benefits. We discussed the following Behavioral Modification Stratagies today: increasing lean protein intake, decreasing simple carbohydrates  and increasing vegetables  Tomara has agreed to follow up with our clinic in 2 weeks. She was informed of the importance of frequent follow up visits to maximize her success with intensive lifestyle modifications for her multiple health conditions.  I, Doreene Nest, am acting as scribe for Katelyn Nip, MD  I have reviewed the above documentation for accuracy and completeness, and I agree with the above. -Katelyn Nip, MD

## 2017-04-09 ENCOUNTER — Encounter: Payer: Self-pay | Admitting: Internal Medicine

## 2017-04-14 ENCOUNTER — Ambulatory Visit: Payer: 59 | Admitting: Internal Medicine

## 2017-04-21 ENCOUNTER — Ambulatory Visit (INDEPENDENT_AMBULATORY_CARE_PROVIDER_SITE_OTHER): Payer: 59 | Admitting: Family Medicine

## 2017-04-29 ENCOUNTER — Ambulatory Visit (INDEPENDENT_AMBULATORY_CARE_PROVIDER_SITE_OTHER): Payer: 59 | Admitting: Family Medicine

## 2017-04-29 ENCOUNTER — Encounter (INDEPENDENT_AMBULATORY_CARE_PROVIDER_SITE_OTHER): Payer: Self-pay | Admitting: Family Medicine

## 2017-04-29 VITALS — BP 96/67 | HR 97 | Temp 98.4°F | Ht 64.0 in | Wt 217.0 lb

## 2017-04-29 DIAGNOSIS — F3289 Other specified depressive episodes: Secondary | ICD-10-CM | POA: Diagnosis not present

## 2017-04-29 DIAGNOSIS — E669 Obesity, unspecified: Secondary | ICD-10-CM

## 2017-04-29 DIAGNOSIS — Z6837 Body mass index (BMI) 37.0-37.9, adult: Secondary | ICD-10-CM | POA: Diagnosis not present

## 2017-04-29 NOTE — Progress Notes (Signed)
Office: [phone]  /  Fax: [phone]   HPI:   Chief Complaint: OBESITY Katelyn Wilson is here to discuss her progress with her obesity treatment plan. She is on the  keep a food journal with 1100 to 1300 calories and 75+ grams of protein  and is following her eating plan approximately 90 % of the time. She states she is walking 15 minutes 2 times per week. Katelyn Wilson continues to struggle to follow a plan due to extended work hours and stress. She needs a plan which is easy and convenient and leaving little prep time. She has no hunger issues. Katelyn Wilson did increase eating out. Her weight is 217 lb (98.4 kg) today and has maintained weight over a period of 3 weeks since her last visit. She has gained 3 lbs since starting treatment with [place].  Depression with emotional eating behaviors Katelyn Wilson re-started Wellbutrin and mood has improved. She has decreased irritability and is sleeping better. Katelyn Wilson struggles with emotional eating and using food for comfort to the extent that it is negatively impacting her health. She often snacks when she is not hungry. Katelyn Wilson sometimes feels she is out of control and then feels guilty that she made poor food choices. She has been working on behavior modification techniques to help reduce her emotional eating and has been somewhat successful. Her blood pressure is stable and she shows no sign of suicidal or homicidal ideations.  Depression screen [facility] 2/9 03/18/2017 12/23/2016 08/18/2016 12/06/2014  Decreased Interest 0 0 0 0  Down, Depressed, Hopeless 0 1 0 0  PHQ - 2 Score 0 1 0 0  Altered sleeping - 0 - -  Tired, decreased energy - 1 - -  Change in appetite - 1 - -  Feeling bad or failure about yourself  - 0 - -  Trouble concentrating - 0 - -  Moving slowly or fidgety/restless - 0 - -  Suicidal thoughts - 0 - -  PHQ-9 Score - 3 - -     ALLERGIES: Allergies  Allergen Reactions  . Penicillins     Amoxicillin    MEDICATIONS: Current Outpatient Prescriptions on File Prior  to Visit  Medication Sig Dispense Refill  . albuterol (PROVENTIL HFA;VENTOLIN HFA) 108 (90 Base) MCG/ACT inhaler Inhale 2 puffs into the lungs every 6 (six) hours as needed for wheezing or shortness of breath. 1 Inhaler 2  . atorvastatin (LIPITOR) 10 MG tablet Take 1 tablet (10 mg total) by mouth at bedtime. 30 tablet 0  . cetirizine (ZYRTEC) 10 MG tablet Take 10 mg by mouth as needed.      . hydrochlorothiazide (HYDRODIURIL) 25 MG tablet Take 1 tablet (25 mg total) by mouth daily. 90 tablet 3  . Vitamin D, Ergocalciferol, (DRISDOL) 50000 units CAPS capsule Take 1 capsule (50,000 Units total) by mouth 2 (two) times a week. 30 capsule 2   No current facility-administered medications on file prior to visit.     PAST MEDICAL HISTORY: Past Medical History:  Diagnosis Date  . Acid reflux   . allergic rhinitis   . Anxiety   . Asthma   . Diabetes mellitus   . Hypertension   . Obesity   . Obesity (BMI 30-39.9)   . Prediabetes   . Vitamin D deficiency     PAST SURGICAL HISTORY: Past Surgical History:  Procedure Laterality Date  . none      SOCIAL HISTORY: Social History  Substance Use Topics  . Smoking status: Never Smoker  . Smokeless  tobacco: Never Used  . Alcohol use 2.5 oz/week    5 Standard drinks or equivalent per week     Comment: occasional    FAMILY HISTORY: Family History  Problem Relation Age of Onset  . Heart disease Paternal Aunt 61       sudden death  . Heart disease Father        sudden cardiac death  . Diabetes Father   . Hypertension Father   . Sleep apnea Father   . Diabetes Unknown   . Lung cancer Unknown   . Cancer Unknown   . Diabetes Mother   . Sleep apnea Mother   . Obesity Mother   . Cancer Maternal Grandmother   . Cancer Maternal Grandfather     ROS: Review of Systems  Constitutional: Negative for weight loss.  Psychiatric/Behavioral: Positive for depression. Negative for suicidal ideas.       Irritability    PHYSICAL EXAM: Blood  pressure 96/67, pulse 97, temperature 98.4 F (36.9 C), temperature source Oral, height 5\' 4"  (1.626 m), weight 217 lb (98.4 kg), SpO2 98 %. Body mass index is 37.25 kg/m. Physical Exam  Constitutional: She is oriented to person, place, and time. She appears well-developed and well-nourished.  Cardiovascular: Normal rate.   Pulmonary/Chest: Effort normal.  Musculoskeletal: Normal range of motion.  Neurological: She is oriented to person, place, and time.  Skin: Skin is warm and dry.  Vitals reviewed.   RECENT LABS AND TESTS: BMET    Component Value Date/Time   NA 137 12/23/2016 1030   K 4.6 12/23/2016 1030   CL 97 12/23/2016 1030   CO2 25 12/23/2016 1030   GLUCOSE 115 (H) 12/23/2016 1030   GLUCOSE 93 06/13/2016 1641   BUN 8 12/23/2016 1030   CREATININE 0.74 12/23/2016 1030   CREATININE 0.64 06/13/2016 1641   CALCIUM 9.4 12/23/2016 1030   GFRNONAA 102 12/23/2016 1030   GFRAA 118 12/23/2016 1030   Lab Results  Component Value Date   HGBA1C 5.9 (H) 12/23/2016   HGBA1C 6.1 (H) 11/25/2016   HGBA1C 6.2 (H) 06/13/2016   HGBA1C 6.5 (H) 11/23/2015   HGBA1C 6.6 (A) 05/08/2015   Lab Results  Component Value Date   INSULIN 22.7 12/23/2016   CBC    Component Value Date/Time   WBC 6.0 12/23/2016 1030   WBC 4.9 12/05/2015 1018   RBC 3.80 12/23/2016 1030   RBC 4.59 12/05/2015 1018   HGB 12.9 12/05/2015 1018   HCT 32.6 (L) 12/23/2016 1030   PLT 376.0 12/05/2015 1018   MCV 86 12/23/2016 1030   MCH 28.7 12/23/2016 1030   MCHC 33.4 12/23/2016 1030   MCHC 32.2 12/05/2015 1018   RDW 14.0 12/23/2016 1030   LYMPHSABS 1.5 12/23/2016 1030   MONOABS 0.3 12/05/2015 1018   EOSABS 0.3 12/23/2016 1030   BASOSABS 0.0 12/23/2016 1030   Iron/TIBC/Ferritin/ %Sat No results found for: IRON, TIBC, FERRITIN, IRONPCTSAT Lipid Panel     Component Value Date/Time   CHOL 169 12/23/2016 1030   TRIG 63 12/23/2016 1030   HDL 42 12/23/2016 1030   CHOLHDL 3.7 11/25/2016 1606   CHOLHDL 3.2  06/13/2016 1641   VLDL 15 06/13/2016 1641   LDLCALC 114 (H) 12/23/2016 1030   LDLDIRECT 118 06/13/2016 1641   Hepatic Function Panel     Component Value Date/Time   PROT 7.9 12/23/2016 1030   ALBUMIN 4.0 12/23/2016 1030   AST 14 12/23/2016 1030   ALT 12 12/23/2016 1030   ALKPHOS  80 12/23/2016 1030   BILITOT 0.3 12/23/2016 1030      Component Value Date/Time   TSH 1.070 12/23/2016 1030   TSH 1.920 11/25/2016 1606   TSH 1.38 03/02/2015   TSH 1.010 12/09/2013 1004    ASSESSMENT AND PLAN: Other depression - Plan: buPROPion (WELLBUTRIN XL) 150 MG 24 hr tablet  Class 2 obesity without serious comorbidity with body mass index (BMI) of 37.0 to 37.9 in adult, unspecified obesity type  PLAN:  Depression with Emotional Eating Behaviors We discussed behavior modification techniques today to help Katelyn Wilson deal with her emotional eating and depression. She has agreed to continue to take Wellbutrin SR 150 mg every morning #30 with no refills and agreed to follow up as directed.  Obesity Katelyn Wilson is currently in the action stage of change. As such, her goal is to continue with weight loss efforts She has agreed to keep a food journal with 1100 to 1300 calories and 75+ grams of protein  Katelyn Wilson has been instructed to work up to a goal of 150 minutes of combined cardio and strengthening exercise per week for weight loss and overall health benefits. We discussed the following Behavioral Modification Strategies today: increasing lean protein intake, work on meal planning and easy cooking plans and decrease liquid calories  We discussed a version of pre-packaged plan with journaling, Katelyn Wilson agreed.  Katelyn Wilson has agreed to follow up with our clinic in 2 weeks. She was informed of the importance of frequent follow up visits to maximize her success with intensive lifestyle modifications for her multiple health conditions.  I, Doreene Nest, am acting as scribe for Dennard Nip, MD  I have reviewed the above  documentation for accuracy and completeness, and I agree with the above. -Dennard Nip, MD  OBESITY BEHAVIORAL INTERVENTION VISIT  Today's visit was # 8 out of 22.  Starting weight: 214 lbs Starting date: 12/23/16 Todays weight : 217 lbs Total lbs lost to date: 0 (Patients must lose 7 lbs in the first 6 months to continue with counseling)   ASK: We discussed the diagnosis of obesity with Katelyn Wilson today and Katelyn Wilson agreed to give [place] permission to discuss obesity behavioral modification therapy today.  ASSESS: Katelyn Wilson has the diagnosis of obesity and her BMI today is @TBMI @ [place] is in the action stage of change   ADVISE: Katelyn Wilson was educated on the multiple health risks of obesity as well as the benefit of weight loss to improve her health. She was advised of the need for long term treatment and the importance of lifestyle modifications.  AGREE: Multiple dietary modification options and treatment options were discussed and  Azelie agreed to keep a food journal with 1100 to 1300 calories and 75+ grams of protein  We discussed the following Behavioral Modification Strategies today: increasing lean protein intake, work on meal planning and easy cooking plans and decrease liquid calories

## 2017-04-30 MED ORDER — BUPROPION HCL ER (SR) 150 MG PO TB12: 150.0000 mg | ORAL_TABLET | Freq: Two times a day (BID) | ORAL | 0 refills | Status: DC

## 2017-05-13 ENCOUNTER — Ambulatory Visit (INDEPENDENT_AMBULATORY_CARE_PROVIDER_SITE_OTHER): Payer: 59 | Admitting: Family Medicine

## 2017-05-13 VITALS — BP 117/80 | HR 78 | Temp 97.8°F | Ht 64.0 in | Wt 220.0 lb

## 2017-05-13 DIAGNOSIS — E538 Deficiency of other specified B group vitamins: Secondary | ICD-10-CM | POA: Diagnosis not present

## 2017-05-13 DIAGNOSIS — E559 Vitamin D deficiency, unspecified: Secondary | ICD-10-CM | POA: Diagnosis not present

## 2017-05-13 DIAGNOSIS — Z6837 Body mass index (BMI) 37.0-37.9, adult: Secondary | ICD-10-CM | POA: Diagnosis not present

## 2017-05-13 DIAGNOSIS — Z9189 Other specified personal risk factors, not elsewhere classified: Secondary | ICD-10-CM

## 2017-05-13 DIAGNOSIS — D509 Iron deficiency anemia, unspecified: Secondary | ICD-10-CM | POA: Diagnosis not present

## 2017-05-13 DIAGNOSIS — E669 Obesity, unspecified: Secondary | ICD-10-CM | POA: Diagnosis not present

## 2017-05-13 DIAGNOSIS — R7303 Prediabetes: Secondary | ICD-10-CM | POA: Diagnosis not present

## 2017-05-13 DIAGNOSIS — E66812 Obesity, class 2: Secondary | ICD-10-CM

## 2017-05-13 MED ORDER — LIRAGLUTIDE -WEIGHT MANAGEMENT 18 MG/3ML ~~LOC~~ SOPN: 3.0000 mL | PEN_INJECTOR | Freq: Every morning | SUBCUTANEOUS | 0 refills | Status: DC

## 2017-05-13 NOTE — Progress Notes (Signed)
Office: [phone]  /  Fax: [phone]   HPI:   Chief Complaint: OBESITY Katelyn Wilson is here to discuss her progress with her obesity treatment plan. She is on the  keep a food journal with 1100 to 1300 calories and 75+ grams of protein  and is following her eating plan approximately 90 % of the time. She states she is walking 15 minutes 3 times per week. Katelyn Wilson has not done well with weight loss and in fact has gained 6 pounds since starting with [place]. She is journaling on and off but with a lot of guesstimating. She is not increasing her protein and is not planning meals ahead of time, partly due to her fluctuating work schedule/location. Her weight is 220 lb (99.8 kg) today and has had a weight gain of 3 pounds over a period of 2 weeks since her last visit. She has lost 0 lbs since starting treatment with [place].  Vitamin D deficiency Katelyn Wilson has a diagnosis of vitamin D deficiency. She is currently taking vit D, not yet at goal and denies nausea, vomiting or muscle weakness. Katelyn Wilson is due for labs.  Iron Deficiency Anemia Katelyn Wilson has a diagnosis of iron deficiency anemia. She is not on iron supplementation. Katelyn Wilson is working on increasing iron in her diet.   B12 Deficiency Katelyn Wilson has a diagnosis of B12 insufficiency and notes fatigue. This is not a new diagnosis. She is currently on a multi-vitamin and a higher B12 diet. She is due for labs. Katelyn Wilson is not a vegetarian and does not have a previous diagnosis of pernicious anemia. She does not have a history of weight loss surgery.   Pre-Diabetes Katelyn Wilson has a diagnosis of pre-diabetes based on her elevated Hgb A1c and was informed this puts her at greater risk of developing diabetes. She has deferred taking metformin and continues to work on diet and exercise to decrease risk of diabetes. She denies nausea, polyphagia or hypoglycemia. Katelyn Wilson has no weight loss and is due for labs.  At risk for diabetes Katelyn Wilson is at higher than average risk for developing diabetes due  to her obesity and pre-diabetes. She currently denies polyuria or polydipsia.  ALLERGIES: Allergies  Allergen Reactions  . Penicillins     Amoxicillin    MEDICATIONS: Current Outpatient Prescriptions on File Prior to Visit  Medication Sig Dispense Refill  . albuterol (PROVENTIL HFA;VENTOLIN HFA) 108 (90 Base) MCG/ACT inhaler Inhale 2 puffs into the lungs every 6 (six) hours as needed for wheezing or shortness of breath. 1 Inhaler 2  . atorvastatin (LIPITOR) 10 MG tablet Take 1 tablet (10 mg total) by mouth at bedtime. 30 tablet 0  . buPROPion (WELLBUTRIN SR) 150 MG 12 hr tablet Take 1 tablet (150 mg total) by mouth 2 (two) times daily. 30 tablet 0  . cetirizine (ZYRTEC) 10 MG tablet Take 10 mg by mouth as needed.      . hydrochlorothiazide (HYDRODIURIL) 25 MG tablet Take 1 tablet (25 mg total) by mouth daily. 90 tablet 3  . Vitamin D, Ergocalciferol, (DRISDOL) 50000 units CAPS capsule Take 1 capsule (50,000 Units total) by mouth 2 (two) times a week. 30 capsule 2   No current facility-administered medications on file prior to visit.     PAST MEDICAL HISTORY: Past Medical History:  Diagnosis Date  . Acid reflux   . allergic rhinitis   . Anxiety   . Asthma   . Diabetes mellitus   . Hypertension   . Obesity   . Obesity (  BMI 30-39.9)   . Prediabetes   . Vitamin D deficiency     PAST SURGICAL HISTORY: Past Surgical History:  Procedure Laterality Date  . none      SOCIAL HISTORY: Social History  Substance Use Topics  . Smoking status: Never Smoker  . Smokeless tobacco: Never Used  . Alcohol use 2.5 oz/week    5 Standard drinks or equivalent per week     Comment: occasional    FAMILY HISTORY: Family History  Problem Relation Age of Onset  . Heart disease Paternal Aunt 6       sudden death  . Heart disease Father        sudden cardiac death  . Diabetes Father   . Hypertension Father   . Sleep apnea Father   . Diabetes Unknown   . Lung cancer Unknown   .  Cancer Unknown   . Diabetes Mother   . Sleep apnea Mother   . Obesity Mother   . Cancer Maternal Grandmother   . Cancer Maternal Grandfather     ROS: Review of Systems  Constitutional: Positive for malaise/fatigue. Negative for weight loss.  Gastrointestinal: Negative for nausea and vomiting.  Genitourinary: Negative for frequency.  Musculoskeletal:       Negative muscle weakness  Endo/Heme/Allergies: Negative for polydipsia.       Negative polyphagia Negative hypoglycemia    PHYSICAL EXAM: Blood pressure 117/80, pulse 78, temperature 97.8 F (36.6 C), temperature source Oral, height 5\' 4"  (1.626 m), weight 220 lb (99.8 kg), SpO2 100 %. Body mass index is 37.76 kg/m. Physical Exam  Constitutional: She is oriented to person, place, and time. She appears well-developed and well-nourished.  Cardiovascular: Normal rate.   Pulmonary/Chest: Effort normal.  Musculoskeletal: Normal range of motion.  Neurological: She is oriented to person, place, and time.  Skin: Skin is warm and dry.  Psychiatric: She has a normal mood and affect. Her behavior is normal.  Vitals reviewed.   RECENT LABS AND TESTS: BMET    Component Value Date/Time   NA 137 12/23/2016 1030   K 4.6 12/23/2016 1030   CL 97 12/23/2016 1030   CO2 25 12/23/2016 1030   GLUCOSE 115 (H) 12/23/2016 1030   GLUCOSE 93 06/13/2016 1641   BUN 8 12/23/2016 1030   CREATININE 0.74 12/23/2016 1030   CREATININE 0.64 06/13/2016 1641   CALCIUM 9.4 12/23/2016 1030   GFRNONAA 102 12/23/2016 1030   GFRAA 118 12/23/2016 1030   Lab Results  Component Value Date   HGBA1C 5.9 (H) 12/23/2016   HGBA1C 6.1 (H) 11/25/2016   HGBA1C 6.2 (H) 06/13/2016   HGBA1C 6.5 (H) 11/23/2015   HGBA1C 6.6 (A) 05/08/2015   Lab Results  Component Value Date   INSULIN 22.7 12/23/2016   CBC    Component Value Date/Time   WBC 6.0 12/23/2016 1030   WBC 4.9 12/05/2015 1018   RBC 3.80 12/23/2016 1030   RBC 4.59 12/05/2015 1018   HGB 10.9 (L)  12/23/2016 1030   HCT 32.6 (L) 12/23/2016 1030   PLT 376.0 12/05/2015 1018   MCV 86 12/23/2016 1030   MCH 28.7 12/23/2016 1030   MCHC 33.4 12/23/2016 1030   MCHC 32.2 12/05/2015 1018   RDW 14.0 12/23/2016 1030   LYMPHSABS 1.5 12/23/2016 1030   MONOABS 0.3 12/05/2015 1018   EOSABS 0.3 12/23/2016 1030   BASOSABS 0.0 12/23/2016 1030   Iron/TIBC/Ferritin/ %Sat No results found for: IRON, TIBC, FERRITIN, IRONPCTSAT Lipid Panel     Component  Value Date/Time   CHOL 169 12/23/2016 1030   TRIG 63 12/23/2016 1030   HDL 42 12/23/2016 1030   CHOLHDL 3.7 11/25/2016 1606   CHOLHDL 3.2 06/13/2016 1641   VLDL 15 06/13/2016 1641   LDLCALC 114 (H) 12/23/2016 1030   LDLDIRECT 118 06/13/2016 1641   Hepatic Function Panel     Component Value Date/Time   PROT 7.9 12/23/2016 1030   ALBUMIN 4.0 12/23/2016 1030   AST 14 12/23/2016 1030   ALT 12 12/23/2016 1030   ALKPHOS 80 12/23/2016 1030   BILITOT 0.3 12/23/2016 1030      Component Value Date/Time   TSH 1.070 12/23/2016 1030   TSH 1.920 11/25/2016 1606   TSH 1.38 03/02/2015   TSH 1.010 12/09/2013 1004    ASSESSMENT AND PLAN: Prediabetes - Plan: Comprehensive metabolic panel, Hemoglobin A1c, Insulin, random, CBC with Differential/Platelet, Liraglutide -Weight Management (SAXENDA) 18 MG/3ML SOPN  B12 nutritional deficiency - Plan: Vitamin B12  Vitamin D deficiency - Plan: VITAMIN D 25 Hydroxy (Vit-D Deficiency, Fractures)  Iron deficiency anemia, unspecified iron deficiency anemia type - Plan: CBC with Differential/Platelet, Anemia panel  At risk for diabetes mellitus  Class 2 obesity without serious comorbidity with body mass index (BMI) of 37.0 to 37.9 in adult, unspecified obesity type  PLAN:  Vitamin D Deficiency Katelyn Wilson was informed that low vitamin D levels contributes to fatigue and are associated with obesity, breast, and colon cancer. She agrees to continue to take prescription Vit D @50 ,000 IU every week, we will check labs  and will follow up for routine testing of vitamin D, at least 2-3 times per year. She was informed of the risk of over-replacement of vitamin D and agrees to not increase her dose unless he discusses this with [place] first. Katelyn Wilson agrees to follow up at agreed upon time.  Iron Deficiency Anemia The diagnosis of Iron deficiency anemia was discussed with Katelyn Wilson and was explained in detail. She was given suggestions of iron rich foods and and iron supplement was not prescribed. Labs will be checked today and Katelyn Wilson agrees to follow up with our clinic in 2 weeks.  B12 Deficiency Katelyn Wilson will work on increasing B12 rich foods in her diet. B12 supplementation was not prescribed today. We will plan on checking labs today and Katelyn Wilson agrees to follow up at agreed upon time.  Pre-Diabetes Katelyn Wilson will continue to work on weight loss, exercise, and decreasing simple carbohydrates in her diet to help decrease the risk of diabetes. We dicussed metformin including benefits and risks. She was informed that eating too many simple carbohydrates or too many calories at one sitting increases the likelihood of GI side effects. Katelyn Wilson declined metformin for now and a prescription was not written today. We will check labs and Katelyn Wilson agreed to follow up with [place] as directed to monitor her progress.  Diabetes risk counselling Katelyn Wilson was given extended (at least 15 minutes) diabetes prevention counseling today. She is 41 y.o. female and has risk factors for diabetes including obesity and pre-diabetes. We discussed intensive lifestyle modifications today with an emphasis on weight loss as well as increasing exercise and decreasing simple carbohydrates in her diet.  Obesity Katelyn Wilson is currently in the action stage of change. As such, her goal is to continue with weight loss efforts She has agreed to keep a food journal with 1100 to 1300 calories and 75+ grams of protein  Katelyn Wilson has been instructed to work up to a goal of 150 minutes of combined cardio  and strengthening exercise per week for weight loss and overall health benefits. We discussed the following Behavioral Modification Strategies today: meal planning & cooking strategies and keep a strict food journal We discussed various medication options to help Ashawnti with her weight loss efforts and we both agreed to start Saxenda 3.0 every morning #1 month (Katelyn Wilson to start at 0.6 mg every morning until our next visit in 2 weeks)  Katelyn Wilson has agreed to follow up with our clinic in 2 weeks. She was informed of the importance of frequent follow up visits to maximize her success with intensive lifestyle modifications for her multiple health conditions.  I, Doreene Nest, am acting as scribe for Dennard Nip, MD  I have reviewed the above documentation for accuracy and completeness, and I agree with the above. -Dennard Nip, MD  OBESITY BEHAVIORAL INTERVENTION VISIT  Today's visit was # 9 out of 22.  Starting weight: 214 lbs Starting date: 12/23/16 Today's weight : 220 lbs Today's date: 05/13/2017 Total lbs lost to date: 0 (Patients must lose 7 lbs in the first 6 months to continue with counseling)   ASK: We discussed the diagnosis of obesity with Shanielle L Tayloe today and Staceyann agreed to give [place] permission to discuss obesity behavioral modification therapy today.  ASSESS: Leonarda has the diagnosis of obesity and her BMI today is 37.8 Lonisha is in the action stage of change   ADVISE: Damien was educated on the multiple health risks of obesity as well as the benefit of weight loss to improve her health. She was advised of the need for long term treatment and the importance of lifestyle modifications.  AGREE: Multiple dietary modification options and treatment options were discussed and  Jing agreed to keep a food journal with 1100 to 1300 calories and 75+ grams of protein  We discussed the following Behavioral Modification Strategies today: keep a strict food journal and meal planning & cooking  strategies

## 2017-05-14 LAB — CBC WITH DIFFERENTIAL/PLATELET
Basophils Absolute: 0 10*3/uL (ref 0.0–0.2)
Basos: 1 %
EOS (ABSOLUTE): 0.3 10*3/uL (ref 0.0–0.4)
Eos: 7 %
Hemoglobin: 10.3 g/dL — ABNORMAL LOW (ref 11.1–15.9)
Immature Grans (Abs): 0 10*3/uL (ref 0.0–0.1)
Immature Granulocytes: 0 %
Lymphocytes Absolute: 1.4 10*3/uL (ref 0.7–3.1)
Lymphs: 34 %
MCH: 25.3 pg — AB (ref 26.6–33.0)
MCHC: 32 g/dL (ref 31.5–35.7)
MCV: 79 fL (ref 79–97)
MONOS ABS: 0.3 10*3/uL (ref 0.1–0.9)
Monocytes: 6 %
NEUTROS ABS: 2 10*3/uL (ref 1.4–7.0)
NEUTROS PCT: 52 %
PLATELETS: 459 10*3/uL — AB (ref 150–379)
RBC: 4.07 x10E6/uL (ref 3.77–5.28)
RDW: 15.8 % — AB (ref 12.3–15.4)
WBC: 4 10*3/uL (ref 3.4–10.8)

## 2017-05-14 LAB — COMPREHENSIVE METABOLIC PANEL
ALK PHOS: 60 IU/L (ref 39–117)
ALT: 11 IU/L (ref 0–32)
AST: 12 IU/L (ref 0–40)
Albumin/Globulin Ratio: 1.2 (ref 1.2–2.2)
Albumin: 3.7 g/dL (ref 3.5–5.5)
BUN/Creatinine Ratio: 13 (ref 9–23)
BUN: 10 mg/dL (ref 6–20)
Bilirubin Total: 0.4 mg/dL (ref 0.0–1.2)
CO2: 24 mmol/L (ref 18–29)
CREATININE: 0.76 mg/dL (ref 0.57–1.00)
Calcium: 9.1 mg/dL (ref 8.7–10.2)
Chloride: 102 mmol/L (ref 96–106)
GFR calc Af Amer: 114 mL/min/{1.73_m2} (ref >59)
GFR calc non Af Amer: 99 mL/min/{1.73_m2} (ref >59)
GLOBULIN, TOTAL: 3 g/dL (ref 1.5–4.5)
Glucose: 114 mg/dL — ABNORMAL HIGH (ref 65–99)
POTASSIUM: 4.9 mmol/L (ref 3.5–5.2)
SODIUM: 139 mmol/L (ref 134–144)
Total Protein: 6.7 g/dL (ref 6.0–8.5)

## 2017-05-14 LAB — ANEMIA PANEL
FERRITIN: 11 ng/mL — AB (ref 15–150)
FOLATE, HEMOLYSATE: 337 ng/mL
Folate, RBC: 1047 ng/mL (ref >498)
HEMATOCRIT: 32.2 % — AB (ref 34.0–46.6)
Iron Saturation: 22 % (ref 15–55)
Iron: 81 ug/dL (ref 27–159)
Retic Ct Pct: 1.4 % (ref 0.6–2.6)
Total Iron Binding Capacity: 361 ug/dL (ref 250–450)
UIBC: 280 ug/dL (ref 131–425)
Vitamin B-12: <150 pg/mL — ABNORMAL LOW (ref 232–1245)

## 2017-05-14 LAB — HEMOGLOBIN A1C
ESTIMATED AVERAGE GLUCOSE: 134 mg/dL
HEMOGLOBIN A1C: 6.3 % — AB (ref 4.8–5.6)

## 2017-05-14 LAB — VITAMIN D 25 HYDROXY (VIT D DEFICIENCY, FRACTURES): Vit D, 25-Hydroxy: 82.8 ng/mL (ref 30.0–100.0)

## 2017-05-14 LAB — INSULIN, RANDOM: INSULIN: 17.2 u[IU]/mL (ref 2.6–24.9)

## 2017-05-19 ENCOUNTER — Encounter (INDEPENDENT_AMBULATORY_CARE_PROVIDER_SITE_OTHER): Payer: Self-pay

## 2017-06-01 ENCOUNTER — Encounter (INDEPENDENT_AMBULATORY_CARE_PROVIDER_SITE_OTHER): Payer: Self-pay

## 2017-06-01 ENCOUNTER — Ambulatory Visit (INDEPENDENT_AMBULATORY_CARE_PROVIDER_SITE_OTHER): Payer: 59 | Admitting: Family Medicine

## 2017-06-04 ENCOUNTER — Other Ambulatory Visit: Payer: Self-pay | Admitting: Internal Medicine

## 2017-06-04 DIAGNOSIS — J028 Acute pharyngitis due to other specified organisms: Principal | ICD-10-CM

## 2017-06-04 DIAGNOSIS — B9689 Other specified bacterial agents as the cause of diseases classified elsewhere: Secondary | ICD-10-CM

## 2017-06-23 ENCOUNTER — Ambulatory Visit (INDEPENDENT_AMBULATORY_CARE_PROVIDER_SITE_OTHER): Payer: 59 | Admitting: Family Medicine

## 2017-06-23 ENCOUNTER — Encounter (INDEPENDENT_AMBULATORY_CARE_PROVIDER_SITE_OTHER): Payer: Self-pay

## 2017-07-23 LAB — HM DIABETES EYE EXAM

## 2017-08-31 DIAGNOSIS — H52213 Irregular astigmatism, bilateral: Secondary | ICD-10-CM | POA: Diagnosis not present

## 2017-09-08 ENCOUNTER — Encounter: Payer: Self-pay | Admitting: Internal Medicine

## 2017-09-08 ENCOUNTER — Ambulatory Visit (INDEPENDENT_AMBULATORY_CARE_PROVIDER_SITE_OTHER): Payer: 59 | Admitting: Internal Medicine

## 2017-09-08 VITALS — BP 122/76 | HR 94 | Temp 98.3°F | Resp 16 | Ht 64.0 in | Wt 232.0 lb

## 2017-09-08 DIAGNOSIS — I10 Essential (primary) hypertension: Secondary | ICD-10-CM | POA: Diagnosis not present

## 2017-09-08 DIAGNOSIS — R7303 Prediabetes: Secondary | ICD-10-CM | POA: Diagnosis not present

## 2017-09-08 DIAGNOSIS — E538 Deficiency of other specified B group vitamins: Secondary | ICD-10-CM | POA: Diagnosis not present

## 2017-09-08 DIAGNOSIS — E559 Vitamin D deficiency, unspecified: Secondary | ICD-10-CM | POA: Diagnosis not present

## 2017-09-08 MED ORDER — CYANOCOBALAMIN 1000 MCG/ML IJ SOLN
1000.0000 ug | Freq: Once | INTRAMUSCULAR | Status: AC
  Administered 2017-09-08: 1000 ug via INTRAMUSCULAR

## 2017-09-08 MED ORDER — "SYRINGE 25G X 1"" 3 ML MISC": 0 refills | Status: DC

## 2017-09-08 MED ORDER — CYANOCOBALAMIN 1000 MCG/ML IJ SOLN: INTRAMUSCULAR | 0 refills | Status: DC

## 2017-09-08 NOTE — Patient Instructions (Signed)
your B12 level is low!    I am going to send an rx for injectable B12 to your pharmacy along with syringes and let your friend give you a another injection in 3 days,  Then a weekly injection for 3 more weeks  Then monthly thereafter if your intrinsic factor antibody test Is positive.    You cannot overdose on B12,  It will not harm you.    Reduce your Vitamin D to monthly until you hear from me

## 2017-09-08 NOTE — Progress Notes (Signed)
Subjective:  Patient ID: Katelyn Wilson, female    DOB: May 18, 1976  Age: 41 y.o. MRN: [id number]  CC: The primary encounter diagnosis was B12 deficiency. Diagnoses of Prediabetes, Vitamin D deficiency, Morbid obesity ([place]), and Essential hypertension were also pertinent to this visit.  HPI [name] presents for follow up on hypertension, type 2 diabetes,  And obesity  Hypertension: patient checks blood pressure twice weekly at home.  Readings have been for the most part > 140/80 at rest . Patient is following a reduced salt diet most days and is taking medications as prescribed  Prediabetes:  Taking metformin,,  Prescribed by DR Leafy Ro . No longer taking it.   Obesity . Was referred to Dr Leafy Ro for weight management ,  Has not returned,  Labs were never discussed,  B12 level was very low.  patient unaware .  has gained 18 lbs     Lab Results  Component Value Date   HGBA1C 6.6 (H) 09/08/2017      Outpatient Medications Prior to Visit  Medication Sig Dispense Refill  . cetirizine (ZYRTEC) 10 MG tablet Take 10 mg by mouth as needed.      . hydrochlorothiazide (HYDRODIURIL) 25 MG tablet Take 1 tablet (25 mg total) by mouth daily. 90 tablet 3  . VENTOLIN HFA 108 (90 Base) MCG/ACT inhaler INHALE 2 PUFFS INTO THE LUNGS EVERY SIX HOURS AS NEEDED FOR WHEEZING OR SHORTNESS OF BREATH 18 g 2  . Vitamin D, Ergocalciferol, (DRISDOL) 50000 units CAPS capsule Take 1 capsule (50,000 Units total) by mouth 2 (two) times a week. 30 capsule 2  . atorvastatin (LIPITOR) 10 MG tablet Take 1 tablet (10 mg total) by mouth at bedtime. (Patient not taking: Reported on 09/08/2017) 30 tablet 0  . buPROPion (WELLBUTRIN SR) 150 MG 12 hr tablet Take 1 tablet (150 mg total) by mouth 2 (two) times daily. (Patient not taking: Reported on 09/08/2017) 30 tablet 0  . Liraglutide -Weight Management (SAXENDA) 18 MG/3ML SOPN Inject 3 mLs into the skin every morning. Patient to start at 0.6 mg until next follow-up visit  in two weeks. (Patient not taking: Reported on 09/08/2017) 1 pen 0   No facility-administered medications prior to visit.     Review of Systems;  Patient denies headache, fevers, malaise, unintentional weight loss, skin rash, eye pain, sinus congestion and sinus pain, sore throat, dysphagia,  hemoptysis , cough, dyspnea, wheezing, chest pain, palpitations, orthopnea, edema, abdominal pain, nausea, melena, diarrhea, constipation, flank pain, dysuria, hematuria, urinary  Frequency, nocturia, numbness, tingling, seizures,  Focal weakness, Loss of consciousness,  Tremor, insomnia, depression, anxiety, and suicidal ideation.      Objective:  BP 122/76 (BP Location: Left Arm, Patient Position: Sitting, Cuff Size: Large)   Pulse 94   Temp 98.3 F (36.8 C) (Oral)   Resp 16   Ht 5\' 4"  (1.626 m)   Wt 232 lb (105.2 kg)   SpO2 98%   BMI 39.82 kg/m   BP Readings from Last 3 Encounters:  09/08/17 122/76  05/13/17 117/80  04/29/17 96/67    Wt Readings from Last 3 Encounters:  09/08/17 232 lb (105.2 kg)  05/13/17 220 lb (99.8 kg)  04/29/17 217 lb (98.4 kg)    General appearance: alert, cooperative and appears stated age Ears: normal TM's and external ear canals both ears Throat: lips, mucosa, and tongue normal; teeth and gums normal Neck: no adenopathy, no carotid bruit, supple, symmetrical, trachea midline and thyroid not enlarged, symmetric, no  tenderness/mass/nodules Back: symmetric, no curvature. ROM normal. No CVA tenderness. Lungs: clear to auscultation bilaterally Heart: regular rate and rhythm, S1, S2 normal, no murmur, click, rub or gallop Abdomen: soft, non-tender; bowel sounds normal; no masses,  no organomegaly Pulses: 2+ and symmetric Skin: Skin color, texture, turgor normal. No rashes or lesions Lymph nodes: Cervical, supraclavicular, and axillary nodes normal.  Lab Results  Component Value Date   HGBA1C 6.6 (H) 09/08/2017   HGBA1C 6.3 (H) 05/13/2017   HGBA1C 5.9 (H)  12/23/2016    Lab Results  Component Value Date   CREATININE 0.86 09/08/2017   CREATININE 0.76 05/13/2017   CREATININE 0.74 12/23/2016    Lab Results  Component Value Date   WBC 6.4 09/08/2017   HGB 10.8 (L) 09/08/2017   HCT 33.9 (L) 09/08/2017   PLT 463.0 (H) 09/08/2017   GLUCOSE 105 (H) 09/08/2017   CHOL 169 12/23/2016   TRIG 63 12/23/2016   HDL 42 12/23/2016   LDLDIRECT 118 06/13/2016   LDLCALC 114 (H) 12/23/2016   ALT 14 09/08/2017   AST 13 09/08/2017   NA 135 09/08/2017   K 4.1 09/08/2017   CL 99 09/08/2017   CREATININE 0.86 09/08/2017   BUN 7 09/08/2017   CO2 28 09/08/2017   TSH 1.070 12/23/2016   HGBA1C 6.6 (H) 09/08/2017   MICROALBUR 1.1 06/13/2016    Dg Chest Lordotic Only  Result Date: 06/19/2016 CLINICAL DATA:  Follow-up abnormal chest x-ray EXAM: CHEST SPECIAL VIEW COMPARISON:  12/03/2015 FINDINGS: Single frontal lordotic view of the chest submitted. No infiltrate or pulmonary edema. No definite nodule is noted in right upper lobe. Suboptimal study due to patient's large body habitus. IMPRESSION: No active disease. No definite nodule is identified in right upper lobe. Electronically Signed   By: Lahoma Crocker M.D.   On: 06/19/2016 07:59    Assessment & Plan:   Problem List Items Addressed This Visit    B12 deficiency - Primary    Diagnosed I June, patient was un aware.  B12 inection given today,  Will repeat in 3 days,  ,  Then weekly x 3,  Intrinsic factor antibody pending      Relevant Medications   cyanocobalamin ((VITAMIN B-12)) injection 1,000 mcg (Completed)   Other Relevant Orders   Intrinsic Factor Antibodies   CBC with Differential/Platelet (Completed)   High blood pressure    Managed with hctz.    Lab Results  Component Value Date   CREATININE 0.86 09/08/2017   Lab Results  Component Value Date   NA 135 09/08/2017   K 4.1 09/08/2017   CL 99 09/08/2017   CO2 28 09/08/2017   Lab Results  Component Value Date   MICROALBUR 1.1  06/13/2016         Morbid obesity ([place])    Now with A1c diagnostic of Type 2 DM.  I have reviewed patient's  BMI and encouraged  Continued weight loss with goal of 10% of body weight over the next 6 months using a low glycemic index diet, Victoza for management of type 2 DM,  and regular exercise a minimum of 5 days per week as tolerated by orthopedic conditions.       RESOLVED: Prediabetes    A1c is now elevated enough to diagnose type 2 DM.  Will prescribe Victoza   Lab Results  Component Value Date   HGBA1C 6.6 (H) 09/08/2017         Relevant Orders   Hemoglobin A1c (Completed)  Comprehensive metabolic panel (Completed)   Vitamin D deficiency    Has been taking twice weekly Drisdol ,  Advised to reduce dose to once weekly pending  Repeat Vitamin D level       Relevant Orders   VITAMIN D 25 Hydroxy (Vit-D Deficiency, Fractures) (Completed)     A total of 40 minutes was spent with patient more than half of which was spent in counseling patient on the above mentioned issues , reviewing and explaining recent labs and imaging studies done, and coordination of care.   I have discontinued Ms. Nappi's atorvastatin, buPROPion, and Liraglutide -Weight Management. I am also having her start on cyanocobalamin and SYRINGE 3CC/25GX1". Additionally, I am having her maintain her cetirizine, Vitamin D (Ergocalciferol), hydrochlorothiazide, and VENTOLIN HFA. We administered cyanocobalamin.  Meds ordered this encounter  Medications  . cyanocobalamin (,VITAMIN B-12,) 1000 MCG/ML injection    Sig: inject 1000 mcg weekly x 3,  Then monthly    Dispense:  10 mL    Refill:  0  . Syringe/Needle, Disp, (SYRINGE 3CC/25GX1") 25G X 1" 3 ML MISC    Sig: Use for b12 injections    Dispense:  50 each    Refill:  0  . cyanocobalamin ((VITAMIN B-12)) injection 1,000 mcg    Medications Discontinued During This Encounter  Medication Reason  . atorvastatin (LIPITOR) 10 MG tablet Patient has not taken  in last 30 days  . buPROPion (WELLBUTRIN SR) 150 MG 12 hr tablet Patient has not taken in last 30 days  . Liraglutide -Weight Management (SAXENDA) 18 MG/3ML SOPN Patient has not taken in last 30 days    Follow-up: No Follow-up on file.   Crecencio Mc, MD

## 2017-09-09 ENCOUNTER — Encounter: Payer: Self-pay | Admitting: Internal Medicine

## 2017-09-09 ENCOUNTER — Other Ambulatory Visit: Payer: Self-pay | Admitting: Internal Medicine

## 2017-09-09 DIAGNOSIS — R7303 Prediabetes: Secondary | ICD-10-CM

## 2017-09-09 DIAGNOSIS — E538 Deficiency of other specified B group vitamins: Secondary | ICD-10-CM

## 2017-09-09 DIAGNOSIS — E559 Vitamin D deficiency, unspecified: Secondary | ICD-10-CM

## 2017-09-09 DIAGNOSIS — D510 Vitamin B12 deficiency anemia due to intrinsic factor deficiency: Secondary | ICD-10-CM | POA: Insufficient documentation

## 2017-09-09 DIAGNOSIS — E785 Hyperlipidemia, unspecified: Secondary | ICD-10-CM

## 2017-09-09 LAB — COMPREHENSIVE METABOLIC PANEL
ALBUMIN: 3.8 g/dL (ref 3.5–5.2)
ALK PHOS: 52 U/L (ref 39–117)
ALT: 14 U/L (ref 0–35)
AST: 13 U/L (ref 0–37)
BILIRUBIN TOTAL: 0.3 mg/dL (ref 0.2–1.2)
BUN: 7 mg/dL (ref 6–23)
CALCIUM: 9.1 mg/dL (ref 8.4–10.5)
CHLORIDE: 99 meq/L (ref 96–112)
CO2: 28 mEq/L (ref 19–32)
Creatinine, Ser: 0.86 mg/dL (ref 0.40–1.20)
GFR: 94.03 mL/min (ref >60.00)
Glucose, Bld: 105 mg/dL — ABNORMAL HIGH (ref 70–99)
Potassium: 4.1 mEq/L (ref 3.5–5.1)
Sodium: 135 mEq/L (ref 135–145)
TOTAL PROTEIN: 7.6 g/dL (ref 6.0–8.3)

## 2017-09-09 LAB — CBC WITH DIFFERENTIAL/PLATELET
BASOS PCT: 1 % (ref 0.0–3.0)
Basophils Absolute: 0.1 10*3/uL (ref 0.0–0.1)
EOS ABS: 0.3 10*3/uL (ref 0.0–0.7)
EOS PCT: 5.2 % — AB (ref 0.0–5.0)
HEMATOCRIT: 33.9 % — AB (ref 36.0–46.0)
HEMOGLOBIN: 10.8 g/dL — AB (ref 12.0–15.0)
LYMPHS PCT: 36.5 % (ref 12.0–46.0)
Lymphs Abs: 2.3 10*3/uL (ref 0.7–4.0)
MCHC: 31.8 g/dL (ref 30.0–36.0)
MCV: 79.3 fl (ref 78.0–100.0)
Monocytes Absolute: 0.5 10*3/uL (ref 0.1–1.0)
Monocytes Relative: 7.9 % (ref 3.0–12.0)
Neutro Abs: 3.1 10*3/uL (ref 1.4–7.7)
Neutrophils Relative %: 49.4 % (ref 43.0–77.0)
Platelets: 463 10*3/uL — ABNORMAL HIGH (ref 150.0–400.0)
RBC: 4.28 Mil/uL (ref 3.87–5.11)
RDW: 15.5 % (ref 11.5–15.5)
WBC: 6.4 10*3/uL (ref 4.0–10.5)

## 2017-09-09 LAB — HEMOGLOBIN A1C: Hgb A1c MFr Bld: 6.6 % — ABNORMAL HIGH (ref 4.6–6.5)

## 2017-09-09 LAB — INTRINSIC FACTOR ANTIBODIES: Intrinsic Factor: POSITIVE — AB

## 2017-09-09 LAB — VITAMIN D 25 HYDROXY (VIT D DEFICIENCY, FRACTURES): VITD: 71.12 ng/mL (ref 30.00–100.00)

## 2017-09-09 NOTE — Assessment & Plan Note (Signed)
Diagnosed I June, patient was un aware.  B12 inection given today,  Will repeat in 3 days,  ,  Then weekly x 3,  Intrinsic factor antibody pending

## 2017-09-09 NOTE — Assessment & Plan Note (Signed)
Managed with hctz.    Lab Results  Component Value Date   CREATININE 0.86 09/08/2017   Lab Results  Component Value Date   NA 135 09/08/2017   K 4.1 09/08/2017   CL 99 09/08/2017   CO2 28 09/08/2017   Lab Results  Component Value Date   MICROALBUR 1.1 06/13/2016

## 2017-09-09 NOTE — Assessment & Plan Note (Addendum)
Now with A1c diagnostic of Type 2 DM.  I have reviewed patient's  BMI and encouraged  Continued weight loss with goal of 10% of body weight over the next 6 months using a low glycemic index diet, Victoza for management of type 2 DM,  and regular exercise a minimum of 5 days per week as tolerated by orthopedic conditions.

## 2017-09-09 NOTE — Assessment & Plan Note (Signed)
Has been taking twice weekly Drisdol ,  Advised to reduce dose to once weekly pending  Repeat Vitamin D level

## 2017-09-09 NOTE — Assessment & Plan Note (Signed)
A1c is now elevated enough to diagnose type 2 DM.  Will prescribe Victoza   Lab Results  Component Value Date   HGBA1C 6.6 (H) 09/08/2017

## 2017-09-10 ENCOUNTER — Encounter: Payer: Self-pay | Admitting: Internal Medicine

## 2017-09-11 ENCOUNTER — Other Ambulatory Visit: Payer: Self-pay | Admitting: Internal Medicine

## 2017-09-11 MED ORDER — PEN NEEDLES 31G X 6 MM MISC: 0 refills | Status: DC

## 2017-09-11 MED ORDER — LIRAGLUTIDE 18 MG/3ML ~~LOC~~ SOPN: PEN_INJECTOR | SUBCUTANEOUS | 3 refills | Status: DC

## 2017-09-23 ENCOUNTER — Telehealth: Payer: Self-pay | Admitting: Internal Medicine

## 2017-09-23 NOTE — Telephone Encounter (Signed)
PA started on [place] for Victoza.

## 2017-09-24 ENCOUNTER — Other Ambulatory Visit: Payer: Self-pay

## 2017-09-24 VITALS — BP 112/80 | HR 80 | Resp 16 | Ht 64.0 in | Wt 231.8 lb

## 2017-09-24 DIAGNOSIS — E119 Type 2 diabetes mellitus without complications: Secondary | ICD-10-CM

## 2017-09-24 NOTE — Patient Instructions (Signed)
1. Plan to meet with RD at the [facility] in [place] on 09/29/17 2. Plan to eat 30-45 GM of carbohydrate each meal and 15 GM for snacks. 3. Plan to go to [employer] meetings 4. Plan to walk 15 minutes 2 times a week.  Plan to work up to 150 minutes a week. 5. Plan to complete EMMI programs by 11/07/17 Plan to return to [facility] on 12/24/17 at 3:30PM

## 2017-09-24 NOTE — Patient Outreach (Signed)
[facility]) Care Management   09/24/2017  Takyia L Lingenfelter [birth year] [id number]  Katelyn Wilson is an 41 y.o. female.  Member seen for initial office visit for Link to Wellness program for self management of Type 2 diabetes  Subjective: member states that she has pre-diabetes for several years and her hemoglobin A1C was up to 6.6% when she saw her MD on 09/08/17.  States she is to start Victoza when it gets the prior approval done.  States she had taken Metformin in the past but could not tolerate the GI side effects.  States she had gone to see the weight management MD but she found the plan hard to follow.  States she has used Weight Watchers in the past and liked the plan.  States she still is paying for a membership that she has not been using.  Objective:   ROS  Physical Exam Today's Vitals   09/24/17 1508  BP: 112/80  Pulse: 80  Resp: 16  SpO2: 98%  Weight: 231 lb 12.8 oz (105.1 kg)  Height: 1.626 m (5\' 4" )  PainSc: 0-No pain   Encounter Medications:   Outpatient Encounter Prescriptions as of 09/24/2017  Medication Sig  . cetirizine (ZYRTEC) 10 MG tablet Take 10 mg by mouth as needed.    . cyanocobalamin (,VITAMIN B-12,) 1000 MCG/ML injection inject 1000 mcg weekly x 3,  Then monthly  . hydrochlorothiazide (HYDRODIURIL) 25 MG tablet Take 1 tablet (25 mg total) by mouth daily.  . Syringe/Needle, Disp, (SYRINGE 3CC/25GX1") 25G X 1" 3 ML MISC Use for b12 injections  . VENTOLIN HFA 108 (90 Base) MCG/ACT inhaler INHALE 2 PUFFS INTO THE LUNGS EVERY SIX HOURS AS NEEDED FOR WHEEZING OR SHORTNESS OF BREATH  . Vitamin D, Ergocalciferol, (DRISDOL) 50000 units CAPS capsule Take 1 capsule (50,000 Units total) by mouth 2 (two) times a week. (Patient taking differently: Take 50,000 Units by mouth every 30 (thirty) days. )  . Insulin Pen Needle (PEN NEEDLES) 31G X 6 MM MISC For use with victoza /saxenda  . liraglutide 18 MG/3ML SOPN INJECT  0.6 MG DAILY for one week,  Then  increase to 1.2 mg daily (Patient not taking: Reported on 09/24/2017)   No facility-administered encounter medications on file as of 09/24/2017.     Functional Status:   In your present state of health, do you have any difficulty performing the following activities: 09/24/2017  Hearing? N  Vision? N  Difficulty concentrating or making decisions? N  Walking or climbing stairs? N  Dressing or bathing? N  Doing errands, shopping? N  Some recent data might be hidden    Fall/Depression Screening:    Fall Risk  09/24/2017 03/18/2017 08/18/2016  Falls in the past year? No No No   PHQ 2/9 Scores 09/24/2017 03/18/2017 12/23/2016 08/18/2016 12/06/2014  PHQ - 2 Score 0 0 1 0 0  PHQ- 9 Score - - 3 - -    Assessment:  Member seen for initial office visit for Link to Wellness program for self management of Type 2 diabetes.  Member had hx of pre-diabetes and now has hemoglobin A1C of 6.6%.  Member is to start Victoza but is awaiting to get Rx as she needs prior approval.  Member has not been following a low CHO diet and has not been exercising.  Member is to see RD on 09/29/17.  Member is up to date with annual eye exam and dental exams.  Plan:   Plan to meet with RD  at the [facility] in [place] on 09/29/17 Plan to eat 30-45 GM of carbohydrate each meal and 15 GM for snacks. Plan to go to Weight Watchers meetings Plan to walk 15 minutes 2 times a week.  Plan to work up to 150 minutes a week. Plan to complete EMMI programs by 11/07/17    Plan to return to Link to Wellness on 12/24/17 at 3:30PM  [facility] CM Care Plan Problem One     Most Recent Value  Care Plan Problem One  Potential for elevated blood sugars related to new dx of Type 2 DM  Role Documenting the Problem One  Care Management [place] for Problem One  Active  [facility] Long Term Goal   Member will decrease hemoglobin A1C to below 6.5% in the next 90 days  [facility] Long Term Goal Start Date  09/24/17  Interventions for Problem  One Long Term Goal  Given Link to Wellness diabetes education packet, Reviewed the Link to [employer] and program requirements, Instructed on Type 2 DM and on insulin resisitance, Instructed on how weight loss and exercise can help decrease her hemoglobin A1C, Instructed on CHO counting and portion control, Instructed to try to avoid regular sodas, Encouraged to restart following Weight Watchers plan and to consider going to at work meetings, Given information on at work sites, Physiological scientist on how to administer Victoza, Reviewed potential side effects of Victoza, Instructed to call to schedule follow up with Dr.Tullo in 3 months or sooner if needed    Peter Garter RN, [facility] Care Management Coordinator-Link to [place] Management [phone]

## 2017-09-27 ENCOUNTER — Encounter: Payer: Self-pay | Admitting: Internal Medicine

## 2017-09-29 ENCOUNTER — Encounter: Payer: 59 | Attending: Internal Medicine | Admitting: Dietician

## 2017-09-29 VITALS — Ht 64.0 in | Wt 230.4 lb

## 2017-09-29 DIAGNOSIS — E559 Vitamin D deficiency, unspecified: Secondary | ICD-10-CM | POA: Insufficient documentation

## 2017-09-29 DIAGNOSIS — E785 Hyperlipidemia, unspecified: Secondary | ICD-10-CM | POA: Insufficient documentation

## 2017-09-29 DIAGNOSIS — R7303 Prediabetes: Secondary | ICD-10-CM | POA: Diagnosis not present

## 2017-09-29 DIAGNOSIS — Z713 Dietary counseling and surveillance: Secondary | ICD-10-CM | POA: Diagnosis not present

## 2017-09-29 DIAGNOSIS — E538 Deficiency of other specified B group vitamins: Secondary | ICD-10-CM | POA: Insufficient documentation

## 2017-09-29 DIAGNOSIS — E119 Type 2 diabetes mellitus without complications: Secondary | ICD-10-CM

## 2017-09-29 NOTE — Patient Instructions (Signed)
   Pre-plan meals according to schedule to increase dinner meals eaten at home.   Start some exercise 2 times a week.   Include a small snack in the morning, such as a protein drink, or fruit and cheese or nuts, or a granola bar with protein.

## 2017-09-29 NOTE — Progress Notes (Signed)
Medical Nutrition Therapy: Visit start time: 1100  end time: 1200  Assessment:  Diagnosis: pre-diabetes/ diabetes Past medical history: Vitamin D deficiency, Vitamin B12 deficiency Psychosocial issues/ stress concerns: none Preferred learning method:  . Auditory . Visual . Hands-on  Current weight: 230.4lbs  Height: 5'4" Medications, supplements: reconciled list in medical record  Progress and evaluation: Patient reports recent diagnosis of diabetes, after being in pre-diabetes range for some time. She reports some recent weight gain after having lost about 15lbs, down to 214. She would like to lose weight, her goal is 190lbs. She is using a Weight Watcher's app to track intake/ points, and is planning to increase physical exercise; she has experienced fatigue recently due to her vitamin deficiencies, but is now beginning to feel better. She is gradually decreasing her intake of regular sodas -- had stopped for some time, but started drinking sodas again prior to recent labwork.    Physical activity: no structured exercise. Reports 5000-10,000 steps daily according to step counter.   Dietary Intake:  Usual eating pattern includes 2 meals and 0-1 snacks per day. Dining out frequency: 10 meals per week.  Breakfast: none, not hungry; occasionally fruit or protein drink Snack: none Lunch: usually out; salad works, [employer], various places.  Snack: fruit or celery stick if packed from home Supper: 6-7pm cooks baked chicken or similar, or take-out food from [employer] or olive garden, Social research officer, government. Snack: none Beverages: water, regular sodas-- working to decrease  Nutrition Care Education: Topics covered: diabetes, weight management Basic nutrition: basic food groups, appropriate nutrient balance, appropriate meal and snack schedule, general nutrition guidelines    Weight control: benefits of weight control, role of physical activity, importance of low fat and low sugar food choices, portion  control, plate method for meal planning.  Advanced nutrition: dining out Diabetes: appropriate meal and snack schedule, appropriate carb intake and balance, importance of low sugar or sugar free beverages and alternatives for regular sodas; importance of consistent carb intake throughout the day including in mornings -- and discussed options for light breakfast meal/ snack.    Nutritional Diagnosis:  [place]-2.2 Altered nutrition-related laboratory As related to diabetes.  As evidenced by HbA1C of 6.6%. [place]-3.3 Overweight/obesity As related to excess calories, limited activity.  As evidenced by BMI 39.5, patient report.  Intervention: Instruction as noted above.   Established goals with direction from patient.   She will also be followed by [facility] program nurse.   Education Materials given:  . Plate Planner with food lists . Healthy Eating when Dining Out . Meal planning book from [employer] . Goals/ instructions  Learner/ who was taught:  . Patient   Level of understanding: Marland Kitchen Verbalizes/ demonstrates competency  Demonstrated degree of understanding via:   Teach back Learning barriers: . None  Willingness to learn/ readiness for change: . Eager, change in progress  Monitoring and Evaluation:  Dietary intake, exercise, BG control, and body weight      follow up: 10/28/17

## 2017-10-23 ENCOUNTER — Telehealth: Payer: Self-pay

## 2017-10-23 MED ORDER — DULAGLUTIDE 0.75 MG/0.5ML ~~LOC~~ SOAJ: SUBCUTANEOUS | 2 refills | Status: DC

## 2017-10-23 NOTE — Telephone Encounter (Signed)
Received a fax from [employer] stating that they are unable to cover Victoza at this time. It states that an "approval requires you to try: bydureon, byetta, or trulicity.

## 2017-10-23 NOTE — Telephone Encounter (Signed)
One a week Trulicity pen prescribveed an printed  For distribution to whatever pharmacy she requests (too many in chart)

## 2017-10-23 NOTE — Telephone Encounter (Signed)
LMTCB

## 2017-10-26 NOTE — Telephone Encounter (Signed)
Copied from [place] [phone]. Topic: Quick Communication - See Telephone Encounter >> Oct 23, 2017  4:48 PM Adair Laundry, CMA wrote: CRM for notification. See Telephone encounter for:  10/23/17. >> Oct 26, 2017  2:32 PM Boyd Kerbs wrote: Patient CB said to call prescription in to [facility]

## 2017-10-26 NOTE — Telephone Encounter (Signed)
rx has been printed, signed and faxed.  

## 2017-10-28 ENCOUNTER — Ambulatory Visit: Payer: 59 | Admitting: Dietician

## 2017-11-03 ENCOUNTER — Telehealth: Payer: Self-pay

## 2017-11-03 NOTE — Telephone Encounter (Signed)
PA for Trulicity has been submitted on covermymeds.

## 2017-11-09 ENCOUNTER — Other Ambulatory Visit: Payer: Self-pay

## 2017-11-09 NOTE — Patient Outreach (Signed)
[facility]) Care Management  11/09/2017  Katelyn Wilson January 06, 1976 [id number]   Telephone call to inform of closing of Link to Wellness case and transition to [employer].  Instructed that she will be transitioned to [employer] in 2019 for disease management and the Link to Wellness program will be closed. Instructed that she will be contacted by [employer] by phone in January 2019.   Instructed that she will continue to receive the pharmacy benefit.    [employer] will contact member in January to continue diabetes disease management. Case closed for Link to Wellness as member will be enrolled in an external program. Member and provider to be sent letter on transition to Byron RN, [facility] Care Management Coordinator-Link to [place] Management [phone]

## 2017-11-17 ENCOUNTER — Ambulatory Visit: Payer: 59

## 2017-11-25 ENCOUNTER — Encounter: Payer: Self-pay | Admitting: Dietician

## 2017-11-25 ENCOUNTER — Encounter: Payer: 59 | Attending: Internal Medicine | Admitting: Dietician

## 2017-11-25 VITALS — Ht 64.0 in | Wt 231.9 lb

## 2017-11-25 DIAGNOSIS — R7303 Prediabetes: Secondary | ICD-10-CM | POA: Insufficient documentation

## 2017-11-25 DIAGNOSIS — E785 Hyperlipidemia, unspecified: Secondary | ICD-10-CM | POA: Diagnosis not present

## 2017-11-25 DIAGNOSIS — Z713 Dietary counseling and surveillance: Secondary | ICD-10-CM | POA: Diagnosis not present

## 2017-11-25 DIAGNOSIS — E538 Deficiency of other specified B group vitamins: Secondary | ICD-10-CM | POA: Insufficient documentation

## 2017-11-25 DIAGNOSIS — Z6839 Body mass index (BMI) 39.0-39.9, adult: Secondary | ICD-10-CM

## 2017-11-25 DIAGNOSIS — E6609 Other obesity due to excess calories: Secondary | ICD-10-CM

## 2017-11-25 DIAGNOSIS — E559 Vitamin D deficiency, unspecified: Secondary | ICD-10-CM | POA: Insufficient documentation

## 2017-11-25 NOTE — Progress Notes (Signed)
Medical Nutrition Therapy: Visit start time: 1630  end time: 1700  Assessment:  Diagnosis: pre-diabetes, Vit. B12 and D deficiencies, obesity Medical history changes: no changes Psychosocial issues/ stress concerns: none  Current weight: 231.9lbs  Height: 5'4" Medications, supplement changes: reconciled list in medical record  Progress and evaluation: Weight has increased by 1.5lbs since 09/29/17. Patient reports including breakfast more regularly. Has had some difficulty implementing changes, especially exercise, due to recent vacation as well as Thanksgiving holiday. She continues to take Vitamin B12 and Vitamin D supplements.   Physical activity: did some walking after last visit, but not since vacation.   Dietary Intake:  Usual eating pattern includes 3 meals and 2-3 snacks per day. Dining out frequency: 3-4 meals per week.  Breakfast: egg muffin cups or premier protein drink Snack: fruit Lunch: usually take-out salad esp in past week Snack: clementines, pear, or apple Supper: some low-cal frozen meals (Smart ones), some cooked meals such as chicken baked, with vegetables. Snack: fruit or small bag skinny pop popcorn, 100-cal snack bag cookies Beverages: water, green tea with honey  Nutrition Care Education: Topics covered: weight management  Weight control: reviewed patient's progress since previous visit, discussed options to aid in achieving goals and focusing on positive changes made; discussed options for increasing physical activity; additional options for snacks and healthy meals; benefits of ongoing connection with RN, RD, or life coach to maintain healthy habits for weight loss and BG control.  Nutritional Diagnosis:  [place]-2.2 Altered nutrition-related laboratory As related to pre-diabetes.  As evidenced by elevated HbA1C. [place]-3.3 Overweight/obesity As related to history of excess calories and inactivity.  As evidenced by BMI 39.7, patient report.  Intervention: Instruction  as noted above.   Updated goals with direction from patient.   Commended patient for healthy changes made.    No follow-up scheduled at this time as patient will be involved in new [facility] wellness program. She will schedule RD follow-up later if needed.  Education Materials given:  Marland Kitchen Goals/ instructions  Learner/ who was taught:  . Patient   Level of understanding: Marland Kitchen Verbalizes/ demonstrates competency  Demonstrated degree of understanding via:   Teach back Learning barriers: . None  Willingness to learn/ readiness for change: . Eager, change in progress  Monitoring and Evaluation:  Dietary intake, exercise, BG control, and body weight      follow up: prn

## 2017-11-25 NOTE — Patient Instructions (Signed)
   Resume some regular exercise as soon as able and continue with use opportunities to add movement/ steps in the day.   Keep up with your healthier, home-cooked meals -- or healthy frozen meals. You can add an extra vegetable or fruit to feel fully satisfied.  Great job choosing low-sugar beverages, keep up the good work!

## 2017-12-02 ENCOUNTER — Ambulatory Visit (INDEPENDENT_AMBULATORY_CARE_PROVIDER_SITE_OTHER): Payer: 59 | Admitting: *Deleted

## 2017-12-02 DIAGNOSIS — E119 Type 2 diabetes mellitus without complications: Secondary | ICD-10-CM | POA: Diagnosis not present

## 2017-12-02 NOTE — Progress Notes (Signed)
Please also confirm with the patient whether or not she is taking liraglutide. It is on her medication list and is a similar medication to trulicity. Thanks.

## 2017-12-02 NOTE — Progress Notes (Signed)
I have reviewed the above note and agree.  Eric Sonnenberg, M.D.  

## 2017-12-02 NOTE — Progress Notes (Signed)
Patient came in for teaching to use Trulicity injection pen patient was demonstrated the appropriate use for once weekly injection every 7 days. Patient voiced understanding to injection technique with return demonstration.

## 2017-12-08 HISTORY — PX: BREAST BIOPSY: SHX20

## 2017-12-22 ENCOUNTER — Ambulatory Visit (INDEPENDENT_AMBULATORY_CARE_PROVIDER_SITE_OTHER): Payer: 59 | Admitting: Internal Medicine

## 2017-12-22 ENCOUNTER — Encounter: Payer: Self-pay | Admitting: Internal Medicine

## 2017-12-22 VITALS — BP 110/72 | HR 90 | Temp 98.3°F | Resp 15 | Ht 64.0 in | Wt 233.4 lb

## 2017-12-22 DIAGNOSIS — R5383 Other fatigue: Secondary | ICD-10-CM | POA: Diagnosis not present

## 2017-12-22 DIAGNOSIS — E119 Type 2 diabetes mellitus without complications: Secondary | ICD-10-CM

## 2017-12-22 DIAGNOSIS — Z23 Encounter for immunization: Secondary | ICD-10-CM

## 2017-12-22 NOTE — Progress Notes (Signed)
Subjective:  Patient ID: Katelyn Wilson, female    DOB: 03/26/1976  Age: 42 y.o. MRN: [id number]  CC: The primary encounter diagnosis was Type 2 diabetes mellitus without complication, without long-term current use of insulin ([place]). Diagnoses of Fatigue, unspecified type, Need for 23-polyvalent pneumococcal polysaccharide vaccine, and Morbid obesity ([place]) were also pertinent to this visit.  HPI Katelyn Wilson presents for follow up on type 2 dm complicated by obesity and hypertension  3 month follow up on diabetes.  Patient has no complaints today.  Patient is following a low glycemic index diet and taking all prescribed medications regularly without side effects.  Fasting sugars have been under less than 160 most of the time and post prandials have been under 180 except on rare occasions. Patient is not exercising or  intentionally trying to lose weight .  Patient has had an eye exam in the last 12 months and checks feet regularly for signs of infection.  Patient does not walk barefoot outside,  And denies an numbness tingling or burning in feet. 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    Has gained 2 lbs.  Has started to exercise using a walking program   Eye exam was done In August by Gloriann Loan     Outpatient Medications Prior to Visit  Medication Sig Dispense Refill  . cetirizine (ZYRTEC) 10 MG tablet Take 10 mg by mouth as needed.      . cyanocobalamin (,VITAMIN B-12,) 1000 MCG/ML injection inject 1000 mcg weekly x 3,  Then monthly 10 mL 0  . hydrochlorothiazide (HYDRODIURIL) 25 MG tablet Take 1 tablet (25 mg total) by mouth daily. 90 tablet 3  . VENTOLIN HFA 108 (90 Base) MCG/ACT inhaler INHALE 2 PUFFS INTO THE LUNGS EVERY SIX HOURS AS NEEDED FOR WHEEZING OR SHORTNESS OF BREATH 18 g 2  . Vitamin D, Ergocalciferol, (DRISDOL) 50000 units CAPS capsule Take 1 capsule (50,000 Units total) by mouth 2  (two) times a week. (Patient taking differently: Take 50,000 Units by mouth every 30 (thirty) days. ) 30 capsule 2  . Dulaglutide (TRULICITY) 0.16 WF/0.9NA SOPN inject 0.75 mg into skin once weekly (Patient not taking: Reported on 11/25/2017) 2 mL 2  . Insulin Pen Needle (PEN NEEDLES) 31G X 6 MM MISC For use with victoza /saxenda (Patient not taking: Reported on 09/29/2017) 90 each 0  . liraglutide 18 MG/3ML SOPN INJECT  0.6 MG DAILY for one week,  Then increase to 1.2 mg daily (Patient not taking: Reported on 09/24/2017) 18 mL 3   No facility-administered medications prior to visit.     Review of Systems;  Patient denies headache, fevers, malaise, unintentional weight loss, skin rash, eye pain, sinus congestion and sinus pain, sore throat, dysphagia,  hemoptysis , cough, dyspnea, wheezing, chest pain, palpitations, orthopnea, edema, abdominal pain, nausea, melena, diarrhea, constipation, flank pain, dysuria, hematuria, urinary  Frequency, nocturia, numbness, tingling, seizures,  Focal weakness, Loss of consciousness,  Tremor, insomnia, depression, anxiety, and suicidal ideation.      Objective:  BP 110/72 (BP Location: Left Arm, Patient Position: Sitting, Cuff Size: Large)   Pulse 90   Temp 98.3 F (36.8 C) (Oral)   Resp 15   Ht 5\' 4"  (1.626 m)   Wt 233 lb 6.4 oz (105.9 kg)   SpO2 98%   BMI 40.06 kg/m   BP Readings from Last 3 Encounters:  12/22/17  110/72  09/24/17 112/80  09/08/17 122/76    Wt Readings from Last 3 Encounters:  12/22/17 233 lb 6.4 oz (105.9 kg)  11/25/17 231 lb 14.4 oz (105.2 kg)  09/29/17 230 lb 6.4 oz (104.5 kg)    General appearance: alert, cooperative and appears stated age Ears: normal TM's and external ear canals both ears Throat: lips, mucosa, and tongue normal; teeth and gums normal Neck: no adenopathy, no carotid bruit, supple, symmetrical, trachea midline and thyroid not enlarged, symmetric, no tenderness/mass/nodules Back: symmetric, no  curvature. ROM normal. No CVA tenderness. Lungs: clear to auscultation bilaterally Heart: regular rate and rhythm, S1, S2 normal, no murmur, click, rub or gallop Abdomen: soft, non-tender; bowel sounds normal; no masses,  no organomegaly Pulses: 2+ and symmetric Skin: Skin color, texture, turgor normal. No rashes or lesions Lymph nodes: Cervical, supraclavicular, and axillary nodes normal.  Lab Results  Component Value Date   HGBA1C 6.6 (H) 12/22/2017   HGBA1C 6.6 (H) 09/08/2017   HGBA1C 6.3 (H) 05/13/2017    Lab Results  Component Value Date   CREATININE 0.84 12/22/2017   CREATININE 0.86 09/08/2017   CREATININE 0.76 05/13/2017    Lab Results  Component Value Date   WBC 6.4 09/08/2017   HGB 10.8 (L) 09/08/2017   HCT 33.9 (L) 09/08/2017   PLT 463.0 (H) 09/08/2017   GLUCOSE 97 12/22/2017   CHOL 169 12/23/2016   TRIG 63 12/23/2016   HDL 42 12/23/2016   LDLDIRECT 118 06/13/2016   LDLCALC 114 (H) 12/23/2016   ALT 11 12/22/2017   AST 14 12/22/2017   NA 136 12/22/2017   K 4.1 12/22/2017   CL 101 12/22/2017   CREATININE 0.84 12/22/2017   BUN 7 12/22/2017   CO2 29 12/22/2017   TSH 1.070 12/23/2016   HGBA1C 6.6 (H) 12/22/2017   MICROALBUR <0.7 12/22/2017    Dg Chest Lordotic Only  Result Date: 06/19/2016 CLINICAL DATA:  Follow-up abnormal chest x-ray EXAM: CHEST SPECIAL VIEW COMPARISON:  12/03/2015 FINDINGS: Single frontal lordotic view of the chest submitted. No infiltrate or pulmonary edema. No definite nodule is noted in right upper lobe. Suboptimal study due to patient's large body habitus. IMPRESSION: No active disease. No definite nodule is identified in right upper lobe. Electronically Signed   By: Lahoma Crocker M.D.   On: 06/19/2016 07:59    Assessment & Plan:   Problem List Items Addressed This Visit    Morbid obesity ([place])    Now with A1c diagnostic of Type 2 DM.  I have reviewed patient's  BMI and encouraged  Continued weight loss with goal of 10% of body weight  over the next 6 months using a low glycemic index diet, Victoza for management of type 2 DM,  and regular exercise a minimum of 5 days per week as tolerated by orthopedic conditions.       Type 2 diabetes mellitus without complication, without long-term current use of insulin (H[facility]) - Primary    A1c is now elevated enough to diagnose type 2 DM.  Patient to start Trulicty.  Diet and exercise discussed in detail,  Lab Results  Component Value Date   HGBA1C 6.6 (H) 12/22/2017   Lab Results  Component Value Date   MICROALBUR <0.7 12/22/2017         Relevant Orders   Microalbumin / creatinine urine ratio (Completed)   Hemoglobin A1c (Completed)   Comprehensive metabolic panel (Completed)    Other Visit Diagnoses    Fatigue, unspecified type  Relevant Orders   HIV antibody (Completed)   Need for 23-polyvalent pneumococcal polysaccharide vaccine       Relevant Orders   Pneumococcal polysaccharide vaccine 23-valent greater than or equal to 2yo subcutaneous/IM (Completed)     A total of 25 minutes of face to face time was spent with patient more than half of which was spent in counselling about the above mentioned conditions  and coordination of care   I have discontinued Emelee L. Dowda's liraglutide and Pen Needles. I am also having her maintain her cetirizine, Vitamin D (Ergocalciferol), hydrochlorothiazide, VENTOLIN HFA, cyanocobalamin, and Dulaglutide.  No orders of the defined types were placed in this encounter.   Medications Discontinued During This Encounter  Medication Reason  . Insulin Pen Needle (PEN NEEDLES) 31G X 6 MM MISC Patient has not taken in last 30 days  . liraglutide 18 MG/3ML SOPN Prescription never filled    Follow-up: No Follow-up on file.   Crecencio Mc, MD

## 2017-12-22 NOTE — Patient Instructions (Addendum)
Danton Clap now makes 2 frozen breakfast items that are low carb:  A  Frittata, and an egg sandwhich  that can be microwaved in 2 minutes .they are both available at [employer]  8 net carbs,     Colace  Is a gentle stool softener can take at bedtime   (docusate)  Up to 200 mg daily    You received the Pneumonia vaccine today.  You will be due for the tetanus booster next visit in 3 months

## 2017-12-23 LAB — COMPREHENSIVE METABOLIC PANEL
ALT: 11 U/L (ref 0–35)
AST: 14 U/L (ref 0–37)
Albumin: 3.9 g/dL (ref 3.5–5.2)
Alkaline Phosphatase: 54 U/L (ref 39–117)
BUN: 7 mg/dL (ref 6–23)
CALCIUM: 8.9 mg/dL (ref 8.4–10.5)
CO2: 29 mEq/L (ref 19–32)
CREATININE: 0.84 mg/dL (ref 0.40–1.20)
Chloride: 101 mEq/L (ref 96–112)
GFR: 96.48 mL/min (ref >60.00)
GLUCOSE: 97 mg/dL (ref 70–99)
Potassium: 4.1 mEq/L (ref 3.5–5.1)
Sodium: 136 mEq/L (ref 135–145)
Total Bilirubin: 0.3 mg/dL (ref 0.2–1.2)
Total Protein: 7.8 g/dL (ref 6.0–8.3)

## 2017-12-23 LAB — HIV ANTIBODY (ROUTINE TESTING W REFLEX): HIV 1&2 Ab, 4th Generation: NONREACTIVE

## 2017-12-23 LAB — MICROALBUMIN / CREATININE URINE RATIO
Creatinine,U: 53.1 mg/dL
Microalb Creat Ratio: 1.3 mg/g (ref 0.0–30.0)
Microalb, Ur: <0.7 mg/dL (ref 0.0–1.9)

## 2017-12-23 LAB — HEMOGLOBIN A1C: HEMOGLOBIN A1C: 6.6 % — AB (ref 4.6–6.5)

## 2017-12-23 NOTE — Assessment & Plan Note (Signed)
Now with A1c diagnostic of Type 2 DM.  I have reviewed patient's  BMI and encouraged  Continued weight loss with goal of 10% of body weight over the next 6 months using a low glycemic index diet, Victoza for management of type 2 DM,  and regular exercise a minimum of 5 days per week as tolerated by orthopedic conditions.

## 2017-12-23 NOTE — Assessment & Plan Note (Signed)
A1c is now elevated enough to diagnose type 2 DM.  Patient to start Trulicty.  Diet and exercise discussed in detail,  Lab Results  Component Value Date   HGBA1C 6.6 (H) 12/22/2017   Lab Results  Component Value Date   MICROALBUR <0.7 12/22/2017

## 2017-12-24 ENCOUNTER — Ambulatory Visit: Payer: 59

## 2017-12-30 ENCOUNTER — Encounter: Payer: 59 | Admitting: Obstetrics and Gynecology

## 2018-01-06 ENCOUNTER — Ambulatory Visit: Payer: Self-pay | Admitting: Internal Medicine

## 2018-01-11 ENCOUNTER — Ambulatory Visit (INDEPENDENT_AMBULATORY_CARE_PROVIDER_SITE_OTHER): Payer: Self-pay | Admitting: Family

## 2018-01-11 ENCOUNTER — Encounter: Payer: Self-pay | Admitting: Family

## 2018-01-11 VITALS — BP 104/82 | HR 62 | Temp 98.5°F | Resp 16 | Wt 232.6 lb

## 2018-01-11 DIAGNOSIS — J4520 Mild intermittent asthma, uncomplicated: Secondary | ICD-10-CM

## 2018-01-11 DIAGNOSIS — J069 Acute upper respiratory infection, unspecified: Secondary | ICD-10-CM

## 2018-01-11 MED ORDER — FLUTICASONE PROPIONATE 50 MCG/ACT NA SUSP: 2.0000 | Freq: Every day | NASAL | 6 refills | Status: DC

## 2018-01-11 NOTE — Progress Notes (Signed)
   Subjective:    Patient ID: Katelyn Wilson, female    DOB: 03/22/1976, 42 y.o.   MRN: [id number]  Cough  This is a new problem. The current episode started in the past 7 days. The problem has been waxing and waning. The problem occurs every few minutes. The cough is non-productive. Associated symptoms include a sore throat and wheezing. Pertinent negatives include no chills, ear congestion, ear pain, fever, headaches, myalgias or shortness of breath. She has tried rest for the symptoms. The treatment provided mild relief. Her past medical history is significant for asthma.      Review of Systems  Constitutional: Negative for chills and fever.  HENT: Positive for sore throat. Negative for ear pain.   Respiratory: Positive for cough and wheezing. Negative for shortness of breath.   Musculoskeletal: Negative for myalgias.  Neurological: Negative for headaches.  All other systems reviewed and are negative.      Objective:   Physical Exam  Constitutional: She is oriented to person, place, and time. She appears well-developed and well-nourished. No distress.  HENT:  Head: Normocephalic and atraumatic.  Right Ear: External ear normal.  Nose: Mucosal edema and rhinorrhea present.  Mouth/Throat: Posterior oropharyngeal erythema present.  Eyes: Pupils are equal, round, and reactive to light.  Neck: Normal range of motion. Neck supple. No thyromegaly present.  Cardiovascular: Normal rate, regular rhythm, normal heart sounds and intact distal pulses.  No murmur heard. Pulmonary/Chest: Effort normal and breath sounds normal. No respiratory distress. She has no wheezes.  Abdominal: Soft. Bowel sounds are normal. She exhibits no distension. There is no tenderness.  Musculoskeletal: Normal range of motion. She exhibits no edema or tenderness.  Neurological: She is alert and oriented to person, place, and time.  Skin: Skin is warm and dry.  Psychiatric: She has a normal mood and affect. Her  behavior is normal. Judgment and thought content normal.  Vitals reviewed.     BP 104/82 (BP Location: Left Arm, Patient Position: Sitting, Cuff Size: Large)   Pulse 62   Temp 98.5 F (36.9 C) (Oral)   Resp 16   Wt 232 lb 9.6 oz (105.5 kg)   SpO2 98%   BMI 39.93 kg/m      Assessment & Plan:  1. Viral URI - Take meds as prescribed - Use a cool mist humidifier  -Use saline nose sprays frequently -Force fluids -For any cough or congestion  Use plain Mucinex- regular strength or max strength is fine -For fever or aces or pains- take tylenol or ibuprofen appropriate for age and weight. -Throat lozenges if help - fluticasone (FLONASE) 50 MCG/ACT nasal spray; Place 2 sprays into both nostrils daily.  Dispense: 16 g; Refill: 6  2. Mild intermittent asthma without complication No wheezing at this time If wheezing or SOB becomes worse, discussed that we may need to send in rx for prednisone dose pack. Last A1C was at goal    Evelina Dun, FNP

## 2018-01-11 NOTE — Patient Instructions (Signed)
Upper Respiratory Infection, Adult Most upper respiratory infections (URIs) are caused by a virus. A URI affects the nose, throat, and upper air passages. The most common type of URI is often called "the common cold." Follow these instructions at home:  Take medicines only as told by your doctor.  Gargle warm saltwater or take cough drops to comfort your throat as told by your doctor.  Use a warm mist humidifier or inhale steam from a shower to increase air moisture. This may make it easier to breathe.  Drink enough fluid to keep your pee (urine) clear or pale yellow.  Eat soups and other clear broths.  Have a healthy diet.  Rest as needed.  Go back to work when your fever is gone or your doctor says it is okay. ? You may need to stay home longer to avoid giving your URI to others. ? You can also wear a face mask and wash your hands often to prevent spread of the virus.  Use your inhaler more if you have asthma.  Do not use any tobacco products, including cigarettes, chewing tobacco, or electronic cigarettes. If you need help quitting, ask your doctor. Contact a doctor if:  You are getting worse, not better.  Your symptoms are not helped by medicine.  You have chills.  You are getting more short of breath.  You have brown or red mucus.  You have yellow or brown discharge from your nose.  You have pain in your face, especially when you bend forward.  You have a fever.  You have puffy (swollen) neck glands.  You have pain while swallowing.  You have white areas in the back of your throat. Get help right away if:  You have very bad or constant: ? Headache. ? Ear pain. ? Pain in your forehead, behind your eyes, and over your cheekbones (sinus pain). ? Chest pain.  You have long-lasting (chronic) lung disease and any of the following: ? Wheezing. ? Long-lasting cough. ? Coughing up blood. ? A change in your usual mucus.  You have a stiff neck.  You have  changes in your: ? Vision. ? Hearing. ? Thinking. ? Mood. This information is not intended to replace advice given to you by your health care provider. Make sure you discuss any questions you have with your health care provider. Document Released: 05/12/2008 Document Revised: 07/27/2016 Document Reviewed: 03/01/2014 Elsevier Interactive Patient Education  2018 [employer]  

## 2018-01-14 ENCOUNTER — Telehealth: Payer: Self-pay

## 2018-01-14 NOTE — Telephone Encounter (Signed)
Called to follow up with pt to see how she was feeling and pt states she is doing fine.

## 2018-02-04 ENCOUNTER — Ambulatory Visit (INDEPENDENT_AMBULATORY_CARE_PROVIDER_SITE_OTHER): Payer: Self-pay | Admitting: Emergency Medicine

## 2018-02-04 VITALS — BP 124/78 | HR 97 | Temp 98.7°F | Resp 17 | Wt 231.6 lb

## 2018-02-04 DIAGNOSIS — J329 Chronic sinusitis, unspecified: Secondary | ICD-10-CM

## 2018-02-04 MED ORDER — DOXYCYCLINE HYCLATE 100 MG PO TABS: 100.0000 mg | ORAL_TABLET | Freq: Two times a day (BID) | ORAL | 0 refills | Status: DC

## 2018-02-04 MED ORDER — BENZONATATE 100 MG PO CAPS: 100.0000 mg | ORAL_CAPSULE | Freq: Three times a day (TID) | ORAL | 0 refills | Status: DC | PRN

## 2018-02-04 MED ORDER — FLUCONAZOLE 200 MG PO TABS: ORAL_TABLET | ORAL | 0 refills | Status: DC

## 2018-02-04 NOTE — Progress Notes (Signed)
S: Taniyah L Miu is a 42 y.o. female who presents for sinus pain, congestion, sore throat, and cough. Was seen here 1-2 weeks ago for URI, states she was starting to feel better then her symptoms worsened. Does have history of asthma, reports seasonal, not taking any medicine for daily control. Has had no shortness of breath or wheezing. Otherwise in good health  Review of Systems  Constitutional: Negative for chills and fever.  HENT: Positive for congestion, sinus pain and sore throat.   Respiratory: Positive for cough. Negative for shortness of breath.   Cardiovascular: Negative for chest pain and palpitations.  Gastrointestinal: Negative for abdominal pain, diarrhea, heartburn, nausea and vomiting.  Musculoskeletal: Negative.   Neurological: Negative.    O: Vitals:   02/04/18 0858  BP: 124/78  Pulse: 97  Resp: 17  Temp: 98.7 F (37.1 C)  SpO2: 99%   Physical Exam  Constitutional: She appears well-developed and well-nourished. No distress.  HENT:  Head: Normocephalic and atraumatic.  Right Ear: Tympanic membrane and external ear normal.  Left Ear: Tympanic membrane and external ear normal.  Nose: Right sinus exhibits maxillary sinus tenderness. Right sinus exhibits no frontal sinus tenderness. Left sinus exhibits maxillary sinus tenderness. Left sinus exhibits no frontal sinus tenderness.  Mouth/Throat: Oropharynx is clear and moist. No oropharyngeal exudate.  Eyes: Conjunctivae are normal.  Neck: Normal range of motion. Neck supple.  Cardiovascular: Normal rate and regular rhythm.  Pulmonary/Chest: Effort normal and breath sounds normal.  Lymphadenopathy:    She has cervical adenopathy.  Neurological: She is alert.  Skin: Skin is warm and dry. Capillary refill takes less than 2 seconds. She is not diaphoretic.  Nursing note and vitals reviewed.   A: 1. Sinusitis, unspecified chronicity, unspecified location     P: 1. Sinusitis, unspecified chronicity, unspecified  location Rest, fluids, OTC meds as needed - doxycycline (VIBRA-TABS) 100 MG tablet; Take 1 tablet (100 mg total) by mouth 2 (two) times daily.  Dispense: 20 tablet; Refill: 0 - fluconazole (DIFLUCAN) 200 MG tablet; Take one tablet today, wait 3 days, take the second tablet  Dispense: 2 tablet; Refill: 0 - benzonatate (TESSALON PERLES) 100 MG capsule; Take 1 capsule (100 mg total) by mouth 3 (three) times daily as needed.  Dispense: 30 capsule; Refill: 0

## 2018-02-04 NOTE — Patient Instructions (Signed)

## 2018-03-02 ENCOUNTER — Ambulatory Visit (INDEPENDENT_AMBULATORY_CARE_PROVIDER_SITE_OTHER): Payer: 59

## 2018-03-02 ENCOUNTER — Ambulatory Visit: Payer: 59 | Admitting: Sports Medicine

## 2018-03-02 ENCOUNTER — Other Ambulatory Visit: Payer: Self-pay | Admitting: Sports Medicine

## 2018-03-02 ENCOUNTER — Encounter: Payer: Self-pay | Admitting: Sports Medicine

## 2018-03-02 DIAGNOSIS — M722 Plantar fascial fibromatosis: Secondary | ICD-10-CM | POA: Diagnosis not present

## 2018-03-02 DIAGNOSIS — M79671 Pain in right foot: Secondary | ICD-10-CM

## 2018-03-02 MED ORDER — DICLOFENAC SODIUM 75 MG PO TBEC: 75.0000 mg | DELAYED_RELEASE_TABLET | Freq: Two times a day (BID) | ORAL | 0 refills | Status: DC

## 2018-03-02 NOTE — Patient Instructions (Signed)

## 2018-03-02 NOTE — Progress Notes (Signed)
Subjective: Katelyn Wilson is a 42 y.o. female patient presents to office with complaint of mild arch pain on right x 2 weeks. Patient admits to pain that has slowly worsening and is starting to be constant in arch. Patient has treated this problem with advil, KT tape,changed shoes with a little relief. Denies any other pedal complaints.   States Diabetes is borderline; Not on meds  Patient Active Problem List   Diagnosis Date Noted  . B12 deficiency 09/09/2017  . Hyperlipidemia 02/03/2017  . Shortness of breath 12/23/2016  . Type 2 diabetes mellitus without complication, without long-term current use of insulin ([place]) 12/23/2016  . Vitamin D deficiency 12/02/2016  . Cough variant asthma 12/05/2015  . Asthma with exacerbation 11/12/2015  . Menometrorrhagia 08/07/2014  . Morbid obesity ([place]) 10/23/2013  . Fibroid uterus 06/22/2013  . Contraceptive management 11/16/2011  . Anxiety   . High blood pressure 06/18/2011  . Family history of hypertrophic cardiomyopathy 06/18/2011    Current Outpatient Medications on File Prior to Visit  Medication Sig Dispense Refill  . benzonatate (TESSALON PERLES) 100 MG capsule Take 1 capsule (100 mg total) by mouth 3 (three) times daily as needed. 30 capsule 0  . cetirizine (ZYRTEC) 10 MG tablet Take 10 mg by mouth as needed.      . cyanocobalamin (,VITAMIN B-12,) 1000 MCG/ML injection inject 1000 mcg weekly x 3,  Then monthly 10 mL 0  . doxycycline (VIBRA-TABS) 100 MG tablet Take 1 tablet (100 mg total) by mouth 2 (two) times daily. 20 tablet 0  . Dulaglutide (TRULICITY) 6.94 WN/4.6EV SOPN inject 0.75 mg into skin once weekly 2 mL 2  . fluconazole (DIFLUCAN) 200 MG tablet Take one tablet today, wait 3 days, take the second tablet 2 tablet 0  . fluticasone (FLONASE) 50 MCG/ACT nasal spray Place 2 sprays into both nostrils daily. 16 g 6  . hydrochlorothiazide (HYDRODIURIL) 25 MG tablet Take 1 tablet (25 mg total) by mouth daily. 90 tablet 3  . VENTOLIN  HFA 108 (90 Base) MCG/ACT inhaler INHALE 2 PUFFS INTO THE LUNGS EVERY SIX HOURS AS NEEDED FOR WHEEZING OR SHORTNESS OF BREATH 18 g 2  . Vitamin D, Ergocalciferol, (DRISDOL) 50000 units CAPS capsule Take 1 capsule (50,000 Units total) by mouth 2 (two) times a week. 30 capsule 2   No current facility-administered medications on file prior to visit.     Allergies  Allergen Reactions  . Penicillins Rash    Amoxicillin    Objective: Physical Exam General: The patient is alert and oriented x3 in no acute distress.  Dermatology: Skin is warm, dry and supple bilateral lower extremities. Nails 1-10 are normal. There is no erythema, edema, no eccymosis, no open lesions present. Integument is otherwise unremarkable.  Vascular: Dorsalis Pedis pulse and Posterior Tibial pulse are 2/4 bilateral. Capillary fill time is immediate to all digits.  Neurological: Grossly intact to light touch with an achilles reflex of +2/5 and a  negative Tinel's sign bilateral.  Musculoskeletal: Tenderness to palpation at mid arch on right. No pain to fascial insertion. No pain with compression of calcaneus bilateral. No pain with tuning fork to calcaneus bilateral. No pain with calf compression bilateral. There is decreased Ankle joint range of motion bilateral. + Bunion and pes planus, All other joints range of motion within normal limits bilateral. Strength 5/5 in all groups bilateral.   Gait: Unassisted, Antalgic avoid weight on right  Xray, Right foot:  Normal osseous mineralization. Joint spaces preserved except midtarsal  supportive of planus,+ bunion. No fracture/dislocation/boney destruction. Calcaneal spur present with mild thickening of plantar fascia. No other soft tissue abnormalities or radiopaque foreign bodies.   Assessment and Plan: Problem List Items Addressed This Visit    None    Visit Diagnoses    Arch pain, right    -  Primary   Plantar fasciitis       Relevant Medications   diclofenac  (VOLTAREN) 75 MG EC tablet   Foot pain, right          -Complete examination performed.  -Xrays reviewed -Discussed with patient in detail the condition of mid arch pain with plantar fasciitis, how this occurs and general treatment options. Explained both conservative and surgical treatments.  -Rx Diclofenac  -Recommended good supportive shoes and advised use of OTC insert. Explained to patient that if these orthoses work well, we will continue with these. If these do not improve her condition and  pain, we will consider custom molded orthoses. -Explained in detail the use of the fascial brace which was dispensed at today's visit. -Explained and dispensed to patient daily stretching exercises. -Recommend patient to ice affected area 1-2x daily. -Patient to return to office in 4 weeks for follow up or sooner if problems or questions arise.  Landis Martins, DPM

## 2018-03-22 ENCOUNTER — Encounter: Payer: Self-pay | Admitting: Internal Medicine

## 2018-03-22 ENCOUNTER — Ambulatory Visit: Payer: 59 | Admitting: Internal Medicine

## 2018-03-22 VITALS — BP 120/86 | HR 92 | Temp 98.8°F | Resp 15 | Ht 64.0 in | Wt 240.4 lb

## 2018-03-22 DIAGNOSIS — E785 Hyperlipidemia, unspecified: Secondary | ICD-10-CM | POA: Diagnosis not present

## 2018-03-22 DIAGNOSIS — E119 Type 2 diabetes mellitus without complications: Secondary | ICD-10-CM

## 2018-03-22 NOTE — Progress Notes (Signed)
Subjective:  Patient ID: Katelyn Wilson, female    DOB: [birth year]  Age: 42 y.o. MRN: [id number]  CC: The primary encounter diagnosis was Hyperlipidemia, unspecified hyperlipidemia type. Diagnoses of Type 2 diabetes mellitus without complication, without long-term current use of insulin ([place]) and Morbid obesity ([place]) were also pertinent to this visit.  HPI Katelyn Wilson presents for 3 month follow up on diabetes.  Patient has no complaints today.  Patient is not following a low glycemic index diet or exercising regularly.   She did not tolerate Trulicity due to persistent nausea lasting over a month.  Patient has had an eye exam in the last 12 months and checks feet regularly for signs of infection.  Patient does not walk barefoot outside,  And denies an numbness tingling or burning in feet. Patient is up to date on all recommended vaccinations   Lab Results  Component Value Date   HGBA1C 6.6 (H) 12/22/2017     Outpatient Medications Prior to Visit  Medication Sig Dispense Refill  . cetirizine (ZYRTEC) 10 MG tablet Take 10 mg by mouth as needed.      . cyanocobalamin (,VITAMIN B-12,) 1000 MCG/ML injection inject 1000 mcg weekly x 3,  Then monthly 10 mL 0  . diclofenac (VOLTAREN) 75 MG EC tablet Take 1 tablet (75 mg total) by mouth 2 (two) times daily. 30 tablet 0  . fluticasone (FLONASE) 50 MCG/ACT nasal spray Place 2 sprays into both nostrils daily. 16 g 6  . hydrochlorothiazide (HYDRODIURIL) 25 MG tablet Take 1 tablet (25 mg total) by mouth daily. 90 tablet 3  . VENTOLIN HFA 108 (90 Base) MCG/ACT inhaler INHALE 2 PUFFS INTO THE LUNGS EVERY SIX HOURS AS NEEDED FOR WHEEZING OR SHORTNESS OF BREATH 18 g 2  . Vitamin D, Ergocalciferol, (DRISDOL) 50000 units CAPS capsule Take 1 capsule (50,000 Units total) by mouth 2 (two) times a week. 30 capsule 2  . benzonatate (TESSALON PERLES) 100 MG capsule Take 1 capsule (100 mg total) by mouth 3 (three) times daily as needed. (Patient not taking:  Reported on 03/22/2018) 30 capsule 0  . doxycycline (VIBRA-TABS) 100 MG tablet Take 1 tablet (100 mg total) by mouth 2 (two) times daily. (Patient not taking: Reported on 03/22/2018) 20 tablet 0  . Dulaglutide (TRULICITY) 2.83 TD/1.7OH SOPN inject 0.75 mg into skin once weekly (Patient not taking: Reported on 03/22/2018) 2 mL 2  . fluconazole (DIFLUCAN) 200 MG tablet Take one tablet today, wait 3 days, take the second tablet (Patient not taking: Reported on 03/22/2018) 2 tablet 0   No facility-administered medications prior to visit.     Review of Systems;  Patient denies headache, fevers, malaise, unintentional weight loss, skin rash, eye pain, sinus congestion and sinus pain, sore throat, dysphagia,  hemoptysis , cough, dyspnea, wheezing, chest pain, palpitations, orthopnea, edema, abdominal pain, nausea, melena, diarrhea, constipation, flank pain, dysuria, hematuria, urinary  Frequency, nocturia, numbness, tingling, seizures,  Focal weakness, Loss of consciousness,  Tremor, insomnia, depression, anxiety, and suicidal ideation.      Objective:  BP 120/86 (BP Location: Left Arm, Patient Position: Sitting, Cuff Size: Large)   Pulse 92   Temp 98.8 F (37.1 C) (Oral)   Resp 15   Ht 5\' 4"  (1.626 m)   Wt 240 lb 6.4 oz (109 kg)   SpO2 97%   BMI 41.26 kg/m   BP Readings from Last 3 Encounters:  03/22/18 120/86  02/04/18 124/78  01/11/18 104/82    Wt Readings  from Last 3 Encounters:  03/22/18 240 lb 6.4 oz (109 kg)  02/04/18 231 lb 9.6 oz (105.1 kg)  01/11/18 232 lb 9.6 oz (105.5 kg)    General appearance: alert, cooperative and appears stated age Ears: normal TM's and external ear canals both ears Throat: lips, mucosa, and tongue normal; teeth and gums normal Neck: no adenopathy, no carotid bruit, supple, symmetrical, trachea midline and thyroid not enlarged, symmetric, no tenderness/mass/nodules Back: symmetric, no curvature. ROM normal. No CVA tenderness. Lungs: clear to  auscultation bilaterally Heart: regular rate and rhythm, S1, S2 normal, no murmur, click, rub or gallop Abdomen: soft, non-tender; bowel sounds normal; no masses,  no organomegaly Pulses: 2+ and symmetric Skin: Skin color, texture, turgor normal. No rashes or lesions Lymph nodes: Cervical, supraclavicular, and axillary nodes normal.  Lab Results  Component Value Date   HGBA1C 6.6 (H) 12/22/2017   HGBA1C 6.6 (H) 09/08/2017   HGBA1C 6.3 (H) 05/13/2017    Lab Results  Component Value Date   CREATININE 0.84 12/22/2017   CREATININE 0.86 09/08/2017   CREATININE 0.76 05/13/2017    Lab Results  Component Value Date   WBC 6.4 09/08/2017   HGB 10.8 (L) 09/08/2017   HCT 33.9 (L) 09/08/2017   PLT 463.0 (H) 09/08/2017   GLUCOSE 97 12/22/2017   CHOL 169 12/23/2016   TRIG 63 12/23/2016   HDL 42 12/23/2016   LDLDIRECT 118 06/13/2016   LDLCALC 114 (H) 12/23/2016   ALT 11 12/22/2017   AST 14 12/22/2017   NA 136 12/22/2017   K 4.1 12/22/2017   CL 101 12/22/2017   CREATININE 0.84 12/22/2017   BUN 7 12/22/2017   CO2 29 12/22/2017   TSH 1.070 12/23/2016   HGBA1C 6.6 (H) 12/22/2017   MICROALBUR <0.7 12/22/2017    Dg Chest Lordotic Only  Result Date: 06/19/2016 CLINICAL DATA:  Follow-up abnormal chest x-ray EXAM: CHEST SPECIAL VIEW COMPARISON:  12/03/2015 FINDINGS: Single frontal lordotic view of the chest submitted. No infiltrate or pulmonary edema. No definite nodule is noted in right upper lobe. Suboptimal study due to patient's large body habitus. IMPRESSION: No active disease. No definite nodule is identified in right upper lobe. Electronically Signed   By: Lahoma Crocker M.D.   On: 06/19/2016 07:59    Assessment & Plan:   Problem List Items Addressed This Visit    Hyperlipidemia - Primary   Relevant Orders   Lipid panel   Type 2 diabetes mellitus without complication, without long-term current use of insulin ([facility])    A1c is now elevated enough to diagnose type 2 DM.  Patient did  not tolerate Trulicty.  Diet and exercise discussed in detail, and no medications will be prescribed until a repeat assessment of control is made.   Lab Results  Component Value Date   HGBA1C 6.6 (H) 12/22/2017   Lab Results  Component Value Date   MICROALBUR <0.7 12/22/2017         Relevant Orders   Hemoglobin A1c   Comprehensive metabolic panel   Morbid obesity ([place])    Pilar Plate discussion about the fact that there is very little I can do for her until she has the motivation to change her lifestyle.  She agrees that her lack of motivation is the issue and does not endorse depression as a cause.        A total of 25 minutes of face to face time was spent with patient more than half of which was spent in counselling about  the above mentioned conditions  and coordination of care   I have discontinued Tihanna L. Gurr's Dulaglutide, doxycycline, fluconazole, and benzonatate. I am also having her maintain her cetirizine, Vitamin D (Ergocalciferol), hydrochlorothiazide, VENTOLIN HFA, cyanocobalamin, fluticasone, and diclofenac.  No orders of the defined types were placed in this encounter.   Medications Discontinued During This Encounter  Medication Reason  . benzonatate (TESSALON PERLES) 100 MG capsule Completed Course  . doxycycline (VIBRA-TABS) 100 MG tablet Completed Course  . Dulaglutide (TRULICITY) 5.18 DU/3.7DH SOPN Patient has not taken in last 30 days  . fluconazole (DIFLUCAN) 200 MG tablet Completed Course    Follow-up: Return in about 3 months (around 06/21/2018) for follow up diabetes  91 days !!!!.   Crecencio Mc, MD

## 2018-03-22 NOTE — Patient Instructions (Signed)

## 2018-03-23 NOTE — Assessment & Plan Note (Signed)
Pilar Plate discussion about the fact that there is very little I can do for her until she has the motivation to change her lifestyle.  She agrees that her lack of motivation is the issue and does not endorse depression as a cause.

## 2018-03-23 NOTE — Assessment & Plan Note (Signed)
A1c is now elevated enough to diagnose type 2 DM.  Patient did not tolerate Trulicty.  Diet and exercise discussed in detail, and no medications will be prescribed until a repeat assessment of control is made.   Lab Results  Component Value Date   HGBA1C 6.6 (H) 12/22/2017   Lab Results  Component Value Date   MICROALBUR <0.7 12/22/2017

## 2018-03-25 ENCOUNTER — Other Ambulatory Visit: Payer: 59

## 2018-03-25 NOTE — Addendum Note (Signed)
Addended by: Arby Barrette on: 03/25/2018 11:37 AM   Modules accepted: Orders

## 2018-03-29 ENCOUNTER — Other Ambulatory Visit (INDEPENDENT_AMBULATORY_CARE_PROVIDER_SITE_OTHER): Payer: 59

## 2018-03-29 DIAGNOSIS — E119 Type 2 diabetes mellitus without complications: Secondary | ICD-10-CM | POA: Diagnosis not present

## 2018-03-29 DIAGNOSIS — E785 Hyperlipidemia, unspecified: Secondary | ICD-10-CM

## 2018-03-29 NOTE — Addendum Note (Signed)
Addended by: Arby Barrette on: 03/29/2018 03:50 PM   Modules accepted: Orders

## 2018-03-30 ENCOUNTER — Ambulatory Visit: Payer: 59 | Admitting: Sports Medicine

## 2018-03-30 ENCOUNTER — Encounter: Payer: Self-pay | Admitting: Sports Medicine

## 2018-03-30 DIAGNOSIS — M722 Plantar fascial fibromatosis: Secondary | ICD-10-CM

## 2018-03-30 DIAGNOSIS — M79671 Pain in right foot: Secondary | ICD-10-CM

## 2018-03-30 DIAGNOSIS — M2141 Flat foot [pes planus] (acquired), right foot: Secondary | ICD-10-CM | POA: Diagnosis not present

## 2018-03-30 DIAGNOSIS — M2142 Flat foot [pes planus] (acquired), left foot: Secondary | ICD-10-CM | POA: Diagnosis not present

## 2018-03-30 LAB — COMPREHENSIVE METABOLIC PANEL
ALBUMIN: 3.7 g/dL (ref 3.5–5.2)
ALK PHOS: 55 U/L (ref 39–117)
ALT: 9 U/L (ref 0–35)
AST: 11 U/L (ref 0–37)
BILIRUBIN TOTAL: 0.3 mg/dL (ref 0.2–1.2)
BUN: 7 mg/dL (ref 6–23)
CALCIUM: 8.8 mg/dL (ref 8.4–10.5)
CO2: 27 meq/L (ref 19–32)
CREATININE: 0.69 mg/dL (ref 0.40–1.20)
Chloride: 102 mEq/L (ref 96–112)
GFR: 120.9 mL/min (ref >60.00)
Glucose, Bld: 87 mg/dL (ref 70–99)
Potassium: 3.8 mEq/L (ref 3.5–5.1)
Sodium: 134 mEq/L — ABNORMAL LOW (ref 135–145)
TOTAL PROTEIN: 7.3 g/dL (ref 6.0–8.3)

## 2018-03-30 LAB — LIPID PANEL
CHOL/HDL RATIO: 3
CHOLESTEROL: 167 mg/dL (ref 0–200)
HDL: 50.5 mg/dL (ref >39.00)
LDL Cholesterol: 100 mg/dL — ABNORMAL HIGH (ref 0–99)
NonHDL: 116.92
TRIGLYCERIDES: 84 mg/dL (ref 0.0–149.0)
VLDL: 16.8 mg/dL (ref 0.0–40.0)

## 2018-03-30 LAB — HEMOGLOBIN A1C: HEMOGLOBIN A1C: 6.7 % — AB (ref 4.6–6.5)

## 2018-03-30 NOTE — Progress Notes (Signed)
Subjective: Katelyn Wilson is a 42 y.o. female patient return to office for follow up eval of arch pain. States that since last visit pain is better but still hurts from time to time. Reports that she has been stretching, icing, and using brace with no problems. Reports that she has a few Voltaren tabs left. Denies nausea, vomiting, fever, chills or any other problems.  Patient Active Problem List   Diagnosis Date Noted  . B12 deficiency 09/09/2017  . Hyperlipidemia 02/03/2017  . Shortness of breath 12/23/2016  . Type 2 diabetes mellitus without complication, without long-term current use of insulin ([place]) 12/23/2016  . Vitamin D deficiency 12/02/2016  . Cough variant asthma 12/05/2015  . Asthma with exacerbation 11/12/2015  . Menometrorrhagia 08/07/2014  . Morbid obesity ([place]) 10/23/2013  . Fibroid uterus 06/22/2013  . Contraceptive management 11/16/2011  . Anxiety   . High blood pressure 06/18/2011  . Family history of hypertrophic cardiomyopathy 06/18/2011    Current Outpatient Medications on File Prior to Visit  Medication Sig Dispense Refill  . cetirizine (ZYRTEC) 10 MG tablet Take 10 mg by mouth as needed.      . cyanocobalamin (,VITAMIN B-12,) 1000 MCG/ML injection inject 1000 mcg weekly x 3,  Then monthly 10 mL 0  . diclofenac (VOLTAREN) 75 MG EC tablet Take 1 tablet (75 mg total) by mouth 2 (two) times daily. 30 tablet 0  . fluticasone (FLONASE) 50 MCG/ACT nasal spray Place 2 sprays into both nostrils daily. 16 g 6  . hydrochlorothiazide (HYDRODIURIL) 25 MG tablet Take 1 tablet (25 mg total) by mouth daily. 90 tablet 3  . VENTOLIN HFA 108 (90 Base) MCG/ACT inhaler INHALE 2 PUFFS INTO THE LUNGS EVERY SIX HOURS AS NEEDED FOR WHEEZING OR SHORTNESS OF BREATH 18 g 2  . Vitamin D, Ergocalciferol, (DRISDOL) 50000 units CAPS capsule Take 1 capsule (50,000 Units total) by mouth 2 (two) times a week. 30 capsule 2   No current facility-administered medications on file prior to visit.      Allergies  Allergen Reactions  . Penicillins Rash    Amoxicillin    Objective: Physical Exam General: The patient is alert and oriented x3 in no acute distress.  Dermatology: Skin is warm, dry and supple bilateral lower extremities. Nails 1-10 are normal. There is no erythema, edema, no eccymosis, no open lesions present. Integument is otherwise unremarkable.  Vascular: Dorsalis Pedis pulse and Posterior Tibial pulse are 2/4 bilateral. Capillary fill time is immediate to all digits.  Neurological: Grossly intact to light touch with an achilles reflex of +2/5 and a  negative Tinel's sign bilateral.  Musculoskeletal: Mild tenderness to palpation at mid arch on right. No pain to fascial insertion. No pain with compression of calcaneus bilateral. No pain with tuning fork to calcaneus bilateral. No pain with calf compression bilateral. There is decreased Ankle joint range of motion bilateral. + Bunion and pes planus, All other joints range of motion within normal limits bilateral. Strength 5/5 in all groups bilateral.   Assessment and Plan: Problem List Items Addressed This Visit    None    Visit Diagnoses    Arch pain, right    -  Primary   Plantar fasciitis       Foot pain, right       Pes planus of both feet         -Complete examination performed.  -Discussed with patient in detail the condition of mid arch pain with plantar fasciitis, how this occurs  and general treatment options. Explained both conservative and surgical treatments.  -Rx PT with treatment modalities and [facility] topical pain cream to use as instructed  -Recommended good supportive shoes and advised use of fascial brace -Patient to return to office in 6 weeks for follow up or sooner if problems or questions arise.Advised patient at next visit if pain is better will have her evaluated for custom orthotics  Landis Martins, DPM

## 2018-03-31 ENCOUNTER — Telehealth: Payer: Self-pay | Admitting: *Deleted

## 2018-03-31 DIAGNOSIS — M2141 Flat foot [pes planus] (acquired), right foot: Secondary | ICD-10-CM

## 2018-03-31 DIAGNOSIS — M2142 Flat foot [pes planus] (acquired), left foot: Secondary | ICD-10-CM

## 2018-03-31 DIAGNOSIS — M79671 Pain in right foot: Secondary | ICD-10-CM

## 2018-03-31 DIAGNOSIS — M722 Plantar fascial fibromatosis: Secondary | ICD-10-CM

## 2018-03-31 MED ORDER — NONFORMULARY OR COMPOUNDED ITEM: 2 refills | Status: DC

## 2018-03-31 NOTE — Telephone Encounter (Signed)
Faxed orders to [facility]. 

## 2018-03-31 NOTE — Telephone Encounter (Signed)
-----   Message from Landis Martins, [place] sent at 03/30/2018  1:28 PM EDT ----- Regarding: [facility] and PT for fasciitis/arch pain [facility] topical pain cream add ketamine for right fot  PT at [facility], patient prefers [facility] for Ionotophoresis, TENS, Asytm, and other treatment modailities

## 2018-04-13 ENCOUNTER — Encounter: Payer: Self-pay | Admitting: Physical Therapy

## 2018-04-13 ENCOUNTER — Ambulatory Visit: Payer: 59 | Attending: Sports Medicine | Admitting: Physical Therapy

## 2018-04-13 ENCOUNTER — Other Ambulatory Visit: Payer: Self-pay

## 2018-04-13 DIAGNOSIS — M79671 Pain in right foot: Secondary | ICD-10-CM | POA: Diagnosis not present

## 2018-04-13 DIAGNOSIS — R262 Difficulty in walking, not elsewhere classified: Secondary | ICD-10-CM | POA: Diagnosis not present

## 2018-04-13 DIAGNOSIS — M6281 Muscle weakness (generalized): Secondary | ICD-10-CM | POA: Diagnosis not present

## 2018-04-13 NOTE — Patient Instructions (Signed)
   ARCH LIFTS  Start with your foot on the floor. Raise up the arch of your foot while maintaining your big toe, ball of your foot and heel on the floor the entire time.   Repeat 10-15 times, twice a day (morning and evening).    Towel Pick-up  Place a towel on the floor. Crunch your toes around the towel and attempt to pick it up with your toes.  Scrunch the towel up 1-2 times, twice a day (morning and evening)         Ankle 4way with TB  All theraband exercise is slow and controlled. Do not let the band "bounce" back. A. Plantarflexion: "gas pedal." Keep knee straight.Band around "ball of foot" and press it away as far as possible and slowly return to neutral. Repeat. B. Dorsiflexion: start in neutral and pull theraband back toward you as far as possible. pause. return slowly. keep knee straight. C.Inversion: start neutral and bring band toward your midline without bending or twisting knee. D. Eversion: start neutral and press band out without bending or twisting knee.  Repeat 10-15 each exercise, twice a day (morning and evening)

## 2018-04-13 NOTE — Therapy (Signed)
[place], [place], [address] Phone: [phone]   Fax:  [phone]  Physical Therapy Evaluation  Patient Details  Name: Katelyn Wilson MRN: [id number] Date of Birth: August 09, 1976 Referring Provider: Landis Martins    Encounter Date: 04/13/2018  PT End of Session - 04/13/18 1213    Visit Number  1    Number of Visits  9    Date for PT Re-Evaluation  05/11/18    Authorization Type  [facility]     Authorization Time Period  04/13/18 to 05/14/18    PT Start Time  0845    PT Stop Time  0925    PT Time Calculation (min)  40 min    Activity Tolerance  Patient tolerated treatment well    Behavior During Therapy  [facility] for tasks assessed/performed       Past Medical History:  Diagnosis Date  . Acid reflux   . allergic rhinitis   . Anxiety   . Asthma   . Diabetes mellitus   . Hypertension   . Obesity   . Obesity (BMI 30-39.9)   . Prediabetes   . Vitamin D deficiency     Past Surgical History:  Procedure Laterality Date  . none      There were no vitals filed for this visit.   Subjective Assessment - 04/13/18 0846    Subjective  I've got plantar fasciitis, we have tried home exercises from my MD, medicines, etc, and PT was the next step. I have been rolling my foot on an ice bottle and stretching/moving my foot so far. It is just my right foot. Walking is painful, it is worse in the mornings; sometimes it will stop hurting while I walk, then when I stop walking it hurts again. No numbness/tingling.     How long can you sit comfortably?  no limits     How long can you stand comfortably?  I have to offshift, time varies     How long can you walk comfortably?  I have to offshift, time varies     Patient Stated Goals  get rid of discomfort/pain     Currently in Pain?  Yes at worst 10/10    Pain Score  4     Pain Location  Foot    Pain Orientation  Right    Pain Descriptors / Indicators   Radiating;Sharp;Constant    Pain Type  Chronic pain    Pain Radiating Towards  can raidate out to toes     Pain Onset  More than a month ago    Pain Frequency  Constant    Aggravating Factors   doing too much     Pain Relieving Factors  ice, tennis ball massage     Effect of Pain on Daily Activities  moderate          [facility] PT Assessment - 04/13/18 0001      Assessment   Medical Diagnosis  arch pain/plantar fasciitis     Referring Provider  Landis Martins     Onset Date/Surgical Date  -- 1 month ago     Next MD Visit  Dr. Cannon Kettle early June     Prior Therapy  no PT       Precautions   Precautions  None      Restrictions   Weight Bearing Restrictions  No      Balance Screen   Has the patient fallen in the  past 6 months  No    Has the patient had a decrease in activity level because of a fear of falling?   No    Is the patient reluctant to leave their home because of a fear of falling?   No      Prior Function   Level of Independence  Independent;Independent with basic ADLs;Independent with gait;Independent with transfers    Vocation  Full time employment    Technical brewer at [employer]; walking in department     Leisure  walking for exercise       ROM / Strength   AROM / PROM / Strength  AROM;Strength      AROM   AROM Assessment Site  Ankle    Right/Left Ankle  Left;Right    Right Ankle Dorsiflexion  14    Right Ankle Plantar Flexion  65    Right Ankle Inversion  55    Right Ankle Eversion  28    Left Ankle Dorsiflexion  13    Left Ankle Plantar Flexion  65    Left Ankle Inversion  55    Left Ankle Eversion  50      Strength   Strength Assessment Site  Hip;Knee;Ankle    Right/Left Hip  Left;Right    Right Hip Flexion  3+/5    Right Hip Extension  4/5    Right Hip ABduction  4+/5    Left Hip Flexion  4/5    Left Hip Extension  3/5    Left Hip ABduction  4+/5    Right/Left Knee  Right;Left    Right Knee Flexion  4/5    Right Knee Extension  5/5     Left Knee Flexion  4/5    Left Knee Extension  5/5    Right/Left Ankle  Left;Right    Right Ankle Dorsiflexion  5/5    Right Ankle Plantar Flexion  3-/5    Right Ankle Inversion  5/5    Right Ankle Eversion  5/5    Left Ankle Dorsiflexion  5/5    Left Ankle Plantar Flexion  3+/5    Left Ankle Inversion  5/5    Left Ankle Eversion  5/5      Palpation   Palpation comment  spasm and muscle stiffness noted R plantar fascia muscles                 Objective measurements completed on examination: See above findings.      Katelyn Wilson Adult PT Treatment/Exercise - 04/13/18 0001      Exercises   Exercises  Ankle      Manual Therapy   Manual Therapy  Soft tissue mobilization    Manual therapy comments  separate from all other skilled services     Soft tissue mobilization  STM R plantar fascia muscle groups       Ankle Exercises: Seated   Towel Crunch  2 reps    Other Seated Ankle Exercises  arch raises 1x15     Other Seated Ankle Exercises  ankle 4 way with green TB 1x10 all directions              PT Education - 04/13/18 1213    Education provided  Yes    Education Details  exam findings, POC, HEP, prognosis, anatomy of plantar fascia and relation to current pain patterns     Person(s) Educated  Patient    Methods  Explanation;Handout;Demonstration    Comprehension  Verbalized understanding;Returned demonstration;Need further instruction       PT Short Term Goals - 04/13/18 1216      PT SHORT TERM GOAL #1   Title  Patient to be independent with progressive HEP, to be updated PRN     Time  1    Period  Weeks    Status  New    Target Date  04/20/18      PT SHORT TERM GOAL #2   Title  Patient to verbally be able to state the importance of supportive foot wear in order to assist in reducing pain levels     Time  1    Period  Weeks    Status  New    Target Date  04/20/18        PT Long Term Goals - 04/13/18 1217      PT LONG TERM GOAL #1   Title   Patient to demonstrate all tested muscles as having 5/5 strength in order to reduce pain and assist in correcting muscle imbalances     Time  4    Period  Weeks    Status  New    Target Date  05/11/18      PT LONG TERM GOAL #2   Title  Patient to experience pain as being no more than 3/10 R foot in order to improve QOL and assist in return to regular exercise program     Time  4    Period  Weeks    Status  New      PT LONG TERM GOAL #3   Title  Patient to be able to perform sit to stand and functional squats without offshift away from painful R foot in order to show reduced compensation patterns     Time  4    Period  Weeks    Status  New             Plan - 04/13/18 1214    Clinical Impression Statement  Patient arrives with R foot pain that has been present for about one month; her MD gave her some exercises to try for her plantar fasciitis pain, but it has not really helped. She does have notable history of ankle sprains and report she has noticed that she favors this leg now. Examination reveals mild functional strength deficits, ankle ROM generally WNL and in some cases apparently hypermobile, spasm and tightness in R plantar fascia, and difficulty in walking due to antalgic pattern. She will benefit from skilled PT services to address functional deficits and reduce pain moving forward.     Clinical Presentation  Stable    Clinical Decision Making  Low    Rehab Potential  Excellent    Clinical Impairments Affecting Rehab Potential  (+) motivated to participate, in good health    PT Frequency  2x / week    PT Duration  4 weeks    PT Treatment/Interventions  ADLs/Self Care Home Management;Biofeedback;Cryotherapy;Electrical Stimulation;Iontophoresis 4mg /ml Dexamethasone;Moist Heat;Ultrasound;Gait training;Stair training;Functional mobility training;Therapeutic activities;Therapeutic exercise;Balance training;Neuromuscular re-education;Patient/family education;Orthotic  Fit/Training;Manual techniques;Passive range of motion;Dry needling;Taping    PT Next Visit Plan  review HEP and goals; manual and ice massage to plantar fascia; gross ankle and arch muscle strength, gait training     PT Home Exercise Plan  Eval: arch crunches, 4 way ankle with green TB, towel scrunches     Consulted and Agree with Plan of Care  Patient       Patient will benefit  from skilled therapeutic intervention in order to improve the following deficits and impairments:  Increased fascial restricitons, Improper body mechanics, Pain, Increased muscle spasms, Decreased strength, Difficulty walking, Impaired flexibility, Increased edema  Visit Diagnosis: Pain in right foot - Plan: PT plan of care cert/re-cert  Muscle weakness (generalized) - Plan: PT plan of care cert/re-cert  Difficulty in walking, not elsewhere classified - Plan: PT plan of care cert/re-cert     Problem List Patient Active Problem List   Diagnosis Date Noted  . B12 deficiency 09/09/2017  . Hyperlipidemia 02/03/2017  . Shortness of breath 12/23/2016  . Type 2 diabetes mellitus without complication, without long-term current use of insulin ([place]) 12/23/2016  . Vitamin D deficiency 12/02/2016  . Cough variant asthma 12/05/2015  . Asthma with exacerbation 11/12/2015  . Menometrorrhagia 08/07/2014  . Morbid obesity ([place]) 10/23/2013  . Fibroid uterus 06/22/2013  . Contraceptive management 11/16/2011  . Anxiety   . High blood pressure 06/18/2011  . Family history of hypertrophic cardiomyopathy 06/18/2011    Deniece Ree PT, DPT, CBIS  Supplemental Physical Therapist [place]   Pager [place] [facility] [address] [place], [place], [address] Phone: [phone]   Fax:  [phone]  Name: Katelyn Wilson MRN: [id number] Date of Birth: [birth year]

## 2018-04-21 ENCOUNTER — Encounter: Payer: Self-pay | Admitting: Physical Therapy

## 2018-04-21 ENCOUNTER — Ambulatory Visit: Payer: 59 | Admitting: Physical Therapy

## 2018-04-21 DIAGNOSIS — M6281 Muscle weakness (generalized): Secondary | ICD-10-CM

## 2018-04-21 DIAGNOSIS — M79671 Pain in right foot: Secondary | ICD-10-CM

## 2018-04-21 DIAGNOSIS — R262 Difficulty in walking, not elsewhere classified: Secondary | ICD-10-CM | POA: Diagnosis not present

## 2018-04-21 NOTE — Patient Instructions (Signed)
Issues heel lifts from exercise drawer 1-2 x a day 10 to 25 reps Start with both feet the progress to single leg.

## 2018-04-21 NOTE — Therapy (Signed)
[place], [place], [address] Phone: [phone]   Fax:  [phone]  Physical Therapy Treatment  Patient Details  Name: Katelyn Wilson MRN: [id number] Date of Birth: 12-31-1975 Referring Provider: Landis Martins    Encounter Date: 04/21/2018  PT End of Session - 04/21/18 1228    Visit Number  2    Number of Visits  9    Date for PT Re-Evaluation  05/11/18    PT Start Time  0803    PT Stop Time  0900    PT Time Calculation (min)  57 min    Activity Tolerance  Patient tolerated treatment well    Behavior During Therapy  [facility] for tasks assessed/performed       Past Medical History:  Diagnosis Date  . Acid reflux   . allergic rhinitis   . Anxiety   . Asthma   . Diabetes mellitus   . Hypertension   . Obesity   . Obesity (BMI 30-39.9)   . Prediabetes   . Vitamin D deficiency     Past Surgical History:  Procedure Laterality Date  . none      There were no vitals filed for this visit.  Subjective Assessment - 04/21/18 1224    Subjective  I am really hurting this morning.  It gets a little better as the day goes on.     Currently in Pain?  Yes    Pain Score  8     Pain Location  Foot    Pain Orientation  Right;Mid arch    Pain Descriptors / Indicators  Sharp;Constant;Tightness    Pain Type  Chronic pain    Pain Frequency  Constant    Aggravating Factors   doing too much,  first thing in the morning,  longer standing    Pain Relieving Factors  ice bottle, tennis ball , massage    Effect of Pain on Daily Activities  moderate    Multiple Pain Sites  -- knee pain and back pain sometimes                       [facility] Adult PT Treatment/Exercise - 04/21/18 0001      Self-Care   Self-Care  RICE;Heat/Ice Application Possible need of good shoes with custom inserts    RICE  some edema notes suggested above heart  with cold    Heat/Ice Application  suggested frozen popcorn and use of  saran wrap.      Knee/Hip Exercises: Stretches   Passive Hamstring Stretch  2 reps;30 seconds      Knee/Hip Exercises: Supine   Bridges  10 reps      Knee/Hip Exercises: Sidelying   Clams  10 both      Manual Therapy   Manual Therapy  Taping    Manual therapy comments  separate from all other skilled services     Soft tissue mobilization  STM R plantar fascia muscle groups .  tissue softened    Kinesiotex  Ligament Correction      Kinesiotix   Ligament Correction  fibula posterior support noted increasd DF with decreased pain.             PT Education - 04/21/18 1227    Education provided  Yes    Education Details  Anatomy, shoes supports,  importance of strengthening,  HEP    Methods  Explanation;Demonstration    Comprehension  Verbalized understanding  PT Short Term Goals - 04/13/18 1216      PT SHORT TERM GOAL #1   Title  Patient to be independent with progressive HEP, to be updated PRN     Time  1    Period  Weeks    Status  New    Target Date  04/20/18      PT SHORT TERM GOAL #2   Title  Patient to verbally be able to state the importance of supportive foot wear in order to assist in reducing pain levels     Time  1    Period  Weeks    Status  New    Target Date  04/20/18        PT Long Term Goals - 04/13/18 1217      PT LONG TERM GOAL #1   Title  Patient to demonstrate all tested muscles as having 5/5 strength in order to reduce pain and assist in correcting muscle imbalances     Time  4    Period  Weeks    Status  New    Target Date  05/11/18      PT LONG TERM GOAL #2   Title  Patient to experience pain as being no more than 3/10 R foot in order to improve QOL and assist in return to regular exercise program     Time  4    Period  Weeks    Status  New      PT LONG TERM GOAL #3   Title  Patient to be able to perform sit to stand and functional squats without offshift away from painful R foot in order to show reduced compensation  patterns     Time  4    Period  Weeks    Status  New            Plan - 04/21/18 1235    PT Next Visit Plan  review HEP and goals; manual and ice massage to plantar fascia; gross ankle and arch muscle strength, gait training assess tape    PT Home Exercise Plan  Eval: arch crunches, 4 way ankle with green TB, towel scrunches heel lifts    Consulted and Agree with Plan of Care  Patient       Patient will benefit from skilled therapeutic intervention in order to improve the following deficits and impairments:     Visit Diagnosis: Pain in right foot  Muscle weakness (generalized)  Difficulty in walking, not elsewhere classified     Problem List Patient Active Problem List   Diagnosis Date Noted  . B12 deficiency 09/09/2017  . Hyperlipidemia 02/03/2017  . Shortness of breath 12/23/2016  . Type 2 diabetes mellitus without complication, without long-term current use of insulin ([place]) 12/23/2016  . Vitamin D deficiency 12/02/2016  . Cough variant asthma 12/05/2015  . Asthma with exacerbation 11/12/2015  . Menometrorrhagia 08/07/2014  . Morbid obesity ([place]) 10/23/2013  . Fibroid uterus 06/22/2013  . Contraceptive management 11/16/2011  . Anxiety   . High blood pressure 06/18/2011  . Family history of hypertrophic cardiomyopathy 06/18/2011    Travaughn Vue PTA  04/21/2018, 1:01 PM  [facility] [address] [place], [place], [address] Phone: [phone]   Fax:  [phone]  Name: Katelyn Wilson MRN: [id number] Date of Birth: 1976/09/29

## 2018-04-22 ENCOUNTER — Ambulatory Visit: Payer: 59 | Admitting: Physical Therapy

## 2018-04-22 ENCOUNTER — Encounter: Payer: Self-pay | Admitting: Physical Therapy

## 2018-04-22 DIAGNOSIS — M6281 Muscle weakness (generalized): Secondary | ICD-10-CM

## 2018-04-22 DIAGNOSIS — M79671 Pain in right foot: Secondary | ICD-10-CM

## 2018-04-22 DIAGNOSIS — R262 Difficulty in walking, not elsewhere classified: Secondary | ICD-10-CM

## 2018-04-22 NOTE — Therapy (Signed)
[place], [place], [address] Phone: [phone]   Fax:  [phone]  Physical Therapy Treatment  Patient Details  Name: Katelyn Wilson MRN: [id number] Date of Birth: 11/21/1976 Referring Provider: Landis Martins    Encounter Date: 04/22/2018  PT End of Session - 04/22/18 1412    Visit Number  3    Number of Visits  9    Date for PT Re-Evaluation  05/11/18    Authorization Type  [employer]     Authorization Time Period  04/13/18 to 05/14/18    PT Start Time  1330    PT Stop Time  1410    PT Time Calculation (min)  40 min    Activity Tolerance  Patient tolerated treatment well    Behavior During Therapy  [facility] for tasks assessed/performed       Past Medical History:  Diagnosis Date  . Acid reflux   . allergic rhinitis   . Anxiety   . Asthma   . Diabetes mellitus   . Hypertension   . Obesity   . Obesity (BMI 30-39.9)   . Prediabetes   . Vitamin D deficiency     Past Surgical History:  Procedure Laterality Date  . none      There were no vitals filed for this visit.  Subjective Assessment - 04/22/18 1333    Subjective  I still have the tape from last session on, my ankle was popping but since I've had the tape on that has stopped. The tape seems to be helping my pain. The hip exercises seem to be helping my knee pain as well.     Currently in Pain?  Yes    Pain Score  2     Pain Location  Foot    Pain Orientation  Right;Mid    Pain Descriptors / Indicators  Sore    Pain Type  Chronic pain                       [facility] Adult PT Treatment/Exercise - 04/22/18 0001      Exercises   Exercises  Knee/Hip;Ankle      Knee/Hip Exercises: Stretches   Other Knee/Hip Stretches  plantar fascia stretch 3x30 seconds off step (HEP review)      Knee/Hip Exercises: Supine   Bridges with Clamshell  15 reps;Other (comment) red TB     Straight Leg Raises  Both;1 set;10 reps      Knee/Hip  Exercises: Sidelying   Hip ABduction  Both;1 set;10 reps      Manual Therapy   Manual Therapy  Soft tissue mobilization    Manual therapy comments  separate from all other skilled services     Soft tissue mobilization  STM R plantar fascia muscle groups .  tissue softened      Ankle Exercises: Supine   T-Band  ankle 4-way with green TB         Prone hip extensions 1x10 B      PT Education - 04/22/18 1412    Education provided  Yes    Education Details  HEP updates, POC moving forward    Person(s) Educated  Patient    Methods  Explanation;Handout    Comprehension  Verbalized understanding       PT Short Term Goals - 04/13/18 1216      PT SHORT TERM GOAL #1   Title  Patient to be independent with  progressive HEP, to be updated PRN     Time  1    Period  Weeks    Status  New    Target Date  04/20/18      PT SHORT TERM GOAL #2   Title  Patient to verbally be able to state the importance of supportive foot wear in order to assist in reducing pain levels     Time  1    Period  Weeks    Status  New    Target Date  04/20/18        PT Long Term Goals - 04/13/18 1217      PT LONG TERM GOAL #1   Title  Patient to demonstrate all tested muscles as having 5/5 strength in order to reduce pain and assist in correcting muscle imbalances     Time  4    Period  Weeks    Status  New    Target Date  05/11/18      PT LONG TERM GOAL #2   Title  Patient to experience pain as being no more than 3/10 R foot in order to improve QOL and assist in return to regular exercise program     Time  4    Period  Weeks    Status  New      PT LONG TERM GOAL #3   Title  Patient to be able to perform sit to stand and functional squats without offshift away from painful R foot in order to show reduced compensation patterns     Time  4    Period  Weeks    Status  New            Plan - 04/22/18 1412    Clinical Impression Statement  Patient reports ongoing pain relief with  kinesiotaping, will attempt to switch patient to PTA for training on how to apply K-tape next session due to success in pain management. Otherwise worked on strengthening for entire kinetic chain from hips down, finished with ankle and foot strengthening and ongoing STM work to plantar fascia of R foot. Continued to discuss possible orthotics to assist in pain management as well. Pain 0/10 at EOS.     Rehab Potential  Excellent    Clinical Impairments Affecting Rehab Potential  (+) motivated to participate, in good health    PT Frequency  2x / week    PT Duration  4 weeks    PT Treatment/Interventions  ADLs/Self Care Home Management;Biofeedback;Cryotherapy;Electrical Stimulation;Iontophoresis 4mg /ml Dexamethasone;Moist Heat;Ultrasound;Gait training;Stair training;Functional mobility training;Therapeutic activities;Therapeutic exercise;Balance training;Neuromuscular re-education;Patient/family education;Orthotic Fit/Training;Manual techniques;Passive range of motion;Dry needling;Taping    PT Next Visit Plan  continue with gross kinetic chain strengthening, review form for 4-way ankle exercises. Teach technique for self-taping to R foot/fibula. Continue STM.     PT Home Exercise Plan  Eval: arch crunches, 4 way ankle with green TB, towel scrunches heel lifts    Consulted and Agree with Plan of Care  Patient       Patient will benefit from skilled therapeutic intervention in order to improve the following deficits and impairments:  Increased fascial restricitons, Improper body mechanics, Pain, Increased muscle spasms, Decreased strength, Difficulty walking, Impaired flexibility, Increased edema  Visit Diagnosis: Pain in right foot  Muscle weakness (generalized)  Difficulty in walking, not elsewhere classified     Problem List Patient Active Problem List   Diagnosis Date Noted  . B12 deficiency 09/09/2017  . Hyperlipidemia 02/03/2017  . Shortness of  breath 12/23/2016  . Type 2 diabetes  mellitus without complication, without long-term current use of insulin ([place]) 12/23/2016  . Vitamin D deficiency 12/02/2016  . Cough variant asthma 12/05/2015  . Asthma with exacerbation 11/12/2015  . Menometrorrhagia 08/07/2014  . Morbid obesity ([place]) 10/23/2013  . Fibroid uterus 06/22/2013  . Contraceptive management 11/16/2011  . Anxiety   . High blood pressure 06/18/2011  . Family history of hypertrophic cardiomyopathy 06/18/2011    Deniece Ree PT, DPT, CBIS  Supplemental Physical Therapist [place]   Pager [facility] [address] [place], [place], [address] Phone: [phone]   Fax:  [phone]  Name: Katelyn Wilson MRN: [id number] Date of Birth: 1976/02/13

## 2018-04-22 NOTE — Patient Instructions (Signed)
   Plantar Fascia Stretch off of Step  Stand on the bottom step with the toes of the involved foot on the step. Let your heel sink until you feel a stretch and hold. Do not stretch into pain.  Hold for 30 seconds, then relax.  Repeat 3 times twice a day; make sure you are performing this stretch in the morning.

## 2018-04-26 ENCOUNTER — Encounter: Payer: Self-pay | Admitting: Physical Therapy

## 2018-04-26 ENCOUNTER — Ambulatory Visit: Payer: 59 | Admitting: Physical Therapy

## 2018-04-26 DIAGNOSIS — M6281 Muscle weakness (generalized): Secondary | ICD-10-CM | POA: Diagnosis not present

## 2018-04-26 DIAGNOSIS — R262 Difficulty in walking, not elsewhere classified: Secondary | ICD-10-CM

## 2018-04-26 DIAGNOSIS — M79671 Pain in right foot: Secondary | ICD-10-CM | POA: Diagnosis not present

## 2018-04-26 NOTE — Therapy (Signed)
[place], [place], [address] Phone: [phone]   Fax:  [phone]  Physical Therapy Treatment  Patient Details  Name: Katelyn Wilson MRN: [id number] Date of Birth: 05-02-1976 Referring Provider: Landis Martins    Encounter Date: 04/26/2018  PT End of Session - 04/26/18 1418    Visit Number  4    Number of Visits  9    Date for PT Re-Evaluation  05/11/18    PT Start Time  6010    PT Stop Time  1415    PT Time Calculation (min)  40 min    Activity Tolerance  Patient tolerated treatment well    Behavior During Therapy  [facility] for tasks assessed/performed       Past Medical History:  Diagnosis Date  . Acid reflux   . allergic rhinitis   . Anxiety   . Asthma   . Diabetes mellitus   . Hypertension   . Obesity   . Obesity (BMI 30-39.9)   . Prediabetes   . Vitamin D deficiency     Past Surgical History:  Procedure Laterality Date  . none      There were no vitals filed for this visit.  Subjective Assessment - 04/26/18 1338    Subjective  Less sore with evercises .  Less knee and hip pain.  Less popping with walking.  No radiating pain to toes.    Currently in Pain?  Yes    Pain Score  1  5/10 in the morning.    Pain Location  Foot arch    Pain Orientation  Mid    Pain Descriptors / Indicators  Sore    Pain Radiating Towards  no    Aggravating Factors   in morning  (Improving)    Pain Relieving Factors  ice bottle, tennis ball,  bands and tape,  exercise    Effect of Pain on Daily Activities  improving    Multiple Pain Sites  -- sometimes left shin aches                       [facility] Adult PT Treatment/Exercise - 04/26/18 0001      Knee/Hip Exercises: Supine   Bridges  10 reps    Single Leg Bridge  1 set;Both;10 reps    Straight Leg Raises  Both;1 set;10 reps      Knee/Hip Exercises: Sidelying   Hip ABduction  Both;1 set;15 reps    Clams  15      Kinesiotix   Ligament  Correction  education taping technique with ongoing instructions,  patient was able to do correctly.  No pain with DF      Ankle Exercises: Standing   Heel Raises  20 reps off step    Other Standing Ankle Exercises  tip toe walking      Ankle Exercises: Stretches   Plantar Fascia Stretch  3 reps;30 seconds cued for technique off step      Ankle Exercises: Supine   T-Band  ankle 4-way with green TB  15 X all painfree      Ankle Exercises: Seated   Other Seated Ankle Exercises  toe curls 10 x 5 seconds both              PT Education - 04/26/18 1417    Education provided  Yes    Education Details  taping technique    Person(s) Educated  Patient  Methods  Explanation;Demonstration;Tactile cues;Verbal cues    Comprehension  Verbalized understanding;Returned demonstration       PT Short Term Goals - 04/26/18 1358      PT SHORT TERM GOAL #1   Title  Patient to be independent with progressive HEP, to be updated PRN     Baseline  Independent with exercises so far.      PT SHORT TERM GOAL #2   Title  Patient to verbally be able to state the importance of supportive foot wear in order to assist in reducing pain levels     Baseline  able to do     Time  1    Period  Weeks    Status  Achieved        PT Long Term Goals - 04/13/18 1217      PT LONG TERM GOAL #1   Title  Patient to demonstrate all tested muscles as having 5/5 strength in order to reduce pain and assist in correcting muscle imbalances     Time  4    Period  Weeks    Status  New    Target Date  05/11/18      PT LONG TERM GOAL #2   Title  Patient to experience pain as being no more than 3/10 R foot in order to improve QOL and assist in return to regular exercise program     Time  4    Period  Weeks    Status  New      PT LONG TERM GOAL #3   Title  Patient to be able to perform sit to stand and functional squats without offshift away from painful R foot in order to show reduced compensation patterns      Time  4    Period  Weeks    Status  New            Plan - 04/26/18 1418    Clinical Impression Statement  Patient able to tape ankle with Kinesiotex tape and she had no soreness after.  No pain with exercises today.  No pain with walking post session.  Able to walk tip toe without pain .    PT Next Visit Plan  continue with gross kinetic chain strengthening, review form for 4-way ankle exercises. . Continue STM.     PT Home Exercise Plan  Eval: arch crunches, 4 way ankle with green TB, towel scrunches heel lifts    Consulted and Agree with Plan of Care  Patient       Patient will benefit from skilled therapeutic intervention in order to improve the following deficits and impairments:     Visit Diagnosis: Pain in right foot  Muscle weakness (generalized)  Difficulty in walking, not elsewhere classified     Problem List Patient Active Problem List   Diagnosis Date Noted  . B12 deficiency 09/09/2017  . Hyperlipidemia 02/03/2017  . Shortness of breath 12/23/2016  . Type 2 diabetes mellitus without complication, without long-term current use of insulin ([place]) 12/23/2016  . Vitamin D deficiency 12/02/2016  . Cough variant asthma 12/05/2015  . Asthma with exacerbation 11/12/2015  . Menometrorrhagia 08/07/2014  . Morbid obesity ([place]) 10/23/2013  . Fibroid uterus 06/22/2013  . Contraceptive management 11/16/2011  . Anxiety   . High blood pressure 06/18/2011  . Family history of hypertrophic cardiomyopathy 06/18/2011    HARRIS,KAREN PTA 04/26/2018, 2:20 PM  [facility] [address] [place], [place], [address]  Phone: [phone]   Fax:  [phone]  Name: Katelyn Wilson MRN: [id number] Date of Birth: 09/01/1976

## 2018-04-28 ENCOUNTER — Encounter: Payer: Self-pay | Admitting: Physical Therapy

## 2018-04-28 ENCOUNTER — Ambulatory Visit: Payer: 59 | Admitting: Physical Therapy

## 2018-04-28 DIAGNOSIS — M79671 Pain in right foot: Secondary | ICD-10-CM

## 2018-04-28 DIAGNOSIS — M6281 Muscle weakness (generalized): Secondary | ICD-10-CM | POA: Diagnosis not present

## 2018-04-28 DIAGNOSIS — R262 Difficulty in walking, not elsewhere classified: Secondary | ICD-10-CM

## 2018-04-28 NOTE — Therapy (Signed)
[place], [place], [address] Phone: [phone]   Fax:  [phone]  Physical Therapy Treatment (discharge)  Patient Details  Name: Katelyn Wilson MRN: [id number] Date of Birth: 11-10-1976 Referring Provider: Landis Martins    Encounter Date: 04/28/2018   PHYSICAL THERAPY DISCHARGE SUMMARY  Visits from Start of Care: 5  Current functional level related to goals / functional outcomes: Patient doing very well with all skilled PT services and is compliant with maintenance program for planter fasciitis. DC today due to all goals met.    Remaining deficits: See below    Education / Equipment: See below  Plan: Patient agrees to discharge.  Patient goals were met. Patient is being discharged due to meeting the stated rehab goals.  ?????        PT End of Session - 04/28/18 1448    Visit Number  5    Number of Visits  5    Date for PT Re-Evaluation  04/28/18    Authorization Type  [employer] [facility]     Authorization Time Period  04/13/18 to 05/14/18    PT Start Time  1417    PT Stop Time  1437    PT Time Calculation (min)  20 min    Activity Tolerance  Patient tolerated treatment well    Behavior During Therapy  [facility] for tasks assessed/performed       Past Medical History:  Diagnosis Date  . Acid reflux   . allergic rhinitis   . Anxiety   . Asthma   . Diabetes mellitus   . Hypertension   . Obesity   . Obesity (BMI 30-39.9)   . Prediabetes   . Vitamin D deficiency     Past Surgical History:  Procedure Laterality Date  . none      There were no vitals filed for this visit.  Subjective Assessment - 04/28/18 1419    Subjective  I am feeling better, the taping is going fine, I've been able to walk on my tip toes without difficulty. I woke up with no pain. My days are going well pain wise.     How long can you sit comfortably?  5/22- no limits     How long can you stand comfortably?  5/22- no  limits     How long can you walk comfortably?  5/22- no limits     Patient Stated Goals  get rid of discomfort/pain     Currently in Pain?  No/denies at worst, can get to a slight ache with no more sharp pain          [facility] PT Assessment - 04/28/18 0001      AROM   Right Ankle Dorsiflexion  18    Right Ankle Plantar Flexion  61    Right Ankle Inversion  50    Right Ankle Eversion  20    Left Ankle Dorsiflexion  18    Left Ankle Plantar Flexion  59    Left Ankle Inversion  59    Left Ankle Eversion  48      Strength   Right Hip Flexion  5/5    Right Hip Extension  4+/5    Right Hip ABduction  4+/5    Left Hip Flexion  5/5    Left Hip Extension  4+/5    Left Hip ABduction  5/5    Right Knee Flexion  5/5    Right Knee Extension  5/5    Left Knee Flexion  5/5    Left Knee Extension  5/5    Right Ankle Dorsiflexion  5/5    Right Ankle Plantar Flexion  4/5    Right Ankle Inversion  5/5    Right Ankle Eversion  5/5    Left Ankle Dorsiflexion  5/5    Left Ankle Plantar Flexion  4+/5    Left Ankle Inversion  5/5    Left Ankle Eversion  5/5                           PT Education - 04/28/18 1447    Education provided  Yes    Education Details  progress towards all goals, DC today due to having met all goals and mostly eliminated pain; importance of consistency with plantar fasciitis PT assigned exercises/taping to prevent pain recurrence. DC today     Person(s) Educated  Patient    Methods  Explanation    Comprehension  Verbalized understanding       PT Short Term Goals - 04/28/18 1432      PT SHORT TERM GOAL #1   Title  Patient to be independent with progressive HEP, to be updated PRN     Baseline  5/22- compliant     Time  1    Period  Weeks    Status  Achieved      PT SHORT TERM GOAL #2   Title  Patient to verbally be able to state the importance of supportive foot wear in order to assist in reducing pain levels     Baseline  5/22- able to do  so     Time  1    Period  Weeks    Status  Achieved        PT Long Term Goals - 04/28/18 1433      PT LONG TERM GOAL #1   Title  Patient to demonstrate all tested muscles as having 5/5 strength in order to reduce pain and assist in correcting muscle imbalances   (Pended)     Baseline  5/22- largely met   (Pended)     Time  4  (Pended)     Period  Weeks  (Pended)     Status  Achieved  (Pended)       PT LONG TERM GOAL #2   Title  Patient to experience pain as being no more than 3/10 R foot in order to improve QOL and assist in return to regular exercise program   (Pended)     Baseline  5/22- just an ache, 3/10 at worst   (Pended)     Time  4  (Pended)     Period  Weeks  (Pended)     Status  Achieved  (Pended)       PT LONG TERM GOAL #3   Title  Patient to be able to perform sit to stand and functional squats without offshift away from painful R foot in order to show reduced compensation patterns   (Pended)     Baseline  5/22- able to do so   (Pended)     Time  4  (Pended)     Period  Weeks  (Pended)     Status  Achieved  (Pended)             Plan - 04/28/18 1448    Clinical Impression Statement  Early re-assessment performed today due  to excellent progress with PT. Patient has made progress towards all goals and is confident in current HEP/self-management program, reports that she has very low pain levels at this time and that she feels confident in remaining consistent with established program moving forward. DC today due to all goals met, skilled PT services no longer required.     Rehab Potential  Excellent    Clinical Impairments Affecting Rehab Potential  (+) motivated to participate, in good health    PT Treatment/Interventions  ADLs/Self Care Home Management;Biofeedback;Cryotherapy;Electrical Stimulation;Iontophoresis 49m/ml Dexamethasone;Moist Heat;Ultrasound;Gait training;Stair training;Functional mobility training;Therapeutic activities;Therapeutic exercise;Balance  training;Neuromuscular re-education;Patient/family education;Orthotic Fit/Training;Manual techniques;Passive range of motion;Dry needling;Taping    PT Next Visit Plan  DC today due to all goals met     PT Home Exercise Plan  Eval: arch crunches, 4 way ankle with green TB, towel scrunches heel lifts    Consulted and Agree with Plan of Care  Patient       Patient will benefit from skilled therapeutic intervention in order to improve the following deficits and impairments:  Increased fascial restricitons, Improper body mechanics, Pain, Increased muscle spasms, Decreased strength, Difficulty walking, Impaired flexibility, Increased edema  Visit Diagnosis: Pain in right foot  Muscle weakness (generalized)  Difficulty in walking, not elsewhere classified     Problem List Patient Active Problem List   Diagnosis Date Noted  . B12 deficiency 09/09/2017  . Hyperlipidemia 02/03/2017  . Shortness of breath 12/23/2016  . Type 2 diabetes mellitus without complication, without long-term current use of insulin ([place] 12/23/2016  . Vitamin D deficiency 12/02/2016  . Cough variant asthma 12/05/2015  . Asthma with exacerbation 11/12/2015  . Menometrorrhagia 08/07/2014  . Morbid obesity [place] 10/23/2013  . Fibroid uterus 06/22/2013  . Contraceptive management 11/16/2011  . Anxiety   . High blood pressure 06/18/2011  . Family history of hypertrophic cardiomyopathy 06/18/2011    KDeniece ReePT, DPT, CBIS  Supplemental Physical Therapist [place]  Pager 3[facility] [address]Phone: [phone]  Fax:  [phone] Name: Katelyn KINNICKMRN: [id number] of Birth: 1Mar 31, 1977

## 2018-05-02 ENCOUNTER — Encounter: Payer: Self-pay | Admitting: Internal Medicine

## 2018-05-02 ENCOUNTER — Other Ambulatory Visit: Payer: Self-pay | Admitting: Internal Medicine

## 2018-05-04 ENCOUNTER — Other Ambulatory Visit: Payer: Self-pay | Admitting: Internal Medicine

## 2018-05-04 MED ORDER — CYANOCOBALAMIN 1000 MCG/ML IJ SOLN: INTRAMUSCULAR | 0 refills | Status: DC

## 2018-05-04 MED ORDER — HYDROCHLOROTHIAZIDE 25 MG PO TABS: 25.0000 mg | ORAL_TABLET | Freq: Every day | ORAL | 3 refills | Status: DC

## 2018-05-05 ENCOUNTER — Ambulatory Visit: Payer: 59 | Admitting: Physical Therapy

## 2018-05-07 ENCOUNTER — Encounter: Payer: 59 | Admitting: Physical Therapy

## 2018-05-11 ENCOUNTER — Encounter: Payer: 59 | Admitting: Physical Therapy

## 2018-05-11 ENCOUNTER — Ambulatory Visit: Payer: 59 | Admitting: Sports Medicine

## 2018-05-13 ENCOUNTER — Encounter: Payer: 59 | Admitting: Physical Therapy

## 2018-05-18 ENCOUNTER — Ambulatory Visit: Payer: 59 | Admitting: Sports Medicine

## 2018-05-18 ENCOUNTER — Encounter: Payer: Self-pay | Admitting: Sports Medicine

## 2018-05-18 DIAGNOSIS — M79671 Pain in right foot: Secondary | ICD-10-CM | POA: Diagnosis not present

## 2018-05-18 DIAGNOSIS — M722 Plantar fascial fibromatosis: Secondary | ICD-10-CM | POA: Diagnosis not present

## 2018-05-18 DIAGNOSIS — M2142 Flat foot [pes planus] (acquired), left foot: Secondary | ICD-10-CM | POA: Diagnosis not present

## 2018-05-18 DIAGNOSIS — M2141 Flat foot [pes planus] (acquired), right foot: Secondary | ICD-10-CM | POA: Diagnosis not present

## 2018-05-18 NOTE — Progress Notes (Signed)
Subjective: Katelyn Wilson is a 42 y.o. female patient return to office for follow up eval of arch pain. States that pain is better and has been using KT tape by PT that helps. Denies swelling, warmth, nausea, vomiting, fever, chills or any other problems.  Patient Active Problem List   Diagnosis Date Noted  . B12 deficiency 09/09/2017  . Hyperlipidemia 02/03/2017  . Shortness of breath 12/23/2016  . Type 2 diabetes mellitus without complication, without long-term current use of insulin ([place]) 12/23/2016  . Vitamin D deficiency 12/02/2016  . Cough variant asthma 12/05/2015  . Asthma with exacerbation 11/12/2015  . Menometrorrhagia 08/07/2014  . Morbid obesity ([place]) 10/23/2013  . Fibroid uterus 06/22/2013  . Contraceptive management 11/16/2011  . Anxiety   . High blood pressure 06/18/2011  . Family history of hypertrophic cardiomyopathy 06/18/2011    Current Outpatient Medications on File Prior to Visit  Medication Sig Dispense Refill  . cetirizine (ZYRTEC) 10 MG tablet Take 10 mg by mouth as needed.      . cyanocobalamin (,VITAMIN B-12,) 1000 MCG/ML injection inject 1000 mcg weekly x 3,  Then monthly 10 mL 0  . diclofenac (VOLTAREN) 75 MG EC tablet Take 1 tablet (75 mg total) by mouth 2 (two) times daily. 30 tablet 0  . fluticasone (FLONASE) 50 MCG/ACT nasal spray Place 2 sprays into both nostrils daily. 16 g 6  . hydrochlorothiazide (HYDRODIURIL) 25 MG tablet Take 1 tablet (25 mg total) by mouth daily. 90 tablet 3  . NONFORMULARY OR COMPOUNDED ITEM [facility]:  Combination Pain Cream - Baclofen 2%, Doxepin 5%, Gabapentin 6%, Topiramate 2%, Pentoxifylline 3%, +Ketamine 10%, apply 1-2 grams to affected area 3-4 times a day. 120 each 2  . VENTOLIN HFA 108 (90 Base) MCG/ACT inhaler INHALE 2 PUFFS INTO THE LUNGS EVERY SIX HOURS AS NEEDED FOR WHEEZING OR SHORTNESS OF BREATH 18 g 2  . Vitamin D, Ergocalciferol, (DRISDOL) 50000 units CAPS capsule Take 1 capsule (50,000 Units total) by  mouth 2 (two) times a week. 30 capsule 2   No current facility-administered medications on file prior to visit.     Allergies  Allergen Reactions  . Penicillins Rash    Amoxicillin    Objective: Physical Exam General: The patient is alert and oriented x3 in no acute distress.  Dermatology: Skin is warm, dry and supple bilateral lower extremities. Nails 1-10 are normal. There is no erythema, edema, no eccymosis, no open lesions present. Integument is otherwise unremarkable.  Vascular: Dorsalis Pedis pulse and Posterior Tibial pulse are 2/4 bilateral. Capillary fill time is immediate to all digits.  Neurological: Grossly intact to light touch with an achilles reflex of +2/5 and a  negative Tinel's sign bilateral.  Musculoskeletal: No reproducible tenderness to palpation at mid arch on right. No pain to fascial insertion. No pain with compression of calcaneus bilateral. No pain with tuning fork to calcaneus bilateral. No pain with calf compression bilateral. There is decreased Ankle joint range of motion bilateral. + Bunion and pes planus, All other joints range of motion within normal limits bilateral. Strength 5/5 in all groups bilateral.   Assessment and Plan: Problem List Items Addressed This Visit    None    Visit Diagnoses    Arch pain, right    -  Primary   Plantar fasciitis       Foot pain, right       Pes planus of both feet         -Complete examination  performed.  -Re-Discussed with patient in detail the condition of mid arch pain with plantar fasciitis that is now improved -Office to check orthotic benefits and arrange casting  -Recommended good supportive shoes and advised use of KT taping x 1 month -Patient to return to office for custom orthotics when called.   Landis Martins, DPM

## 2018-05-25 DIAGNOSIS — M9903 Segmental and somatic dysfunction of lumbar region: Secondary | ICD-10-CM | POA: Diagnosis not present

## 2018-05-25 DIAGNOSIS — M9905 Segmental and somatic dysfunction of pelvic region: Secondary | ICD-10-CM | POA: Diagnosis not present

## 2018-05-25 DIAGNOSIS — M9906 Segmental and somatic dysfunction of lower extremity: Secondary | ICD-10-CM | POA: Diagnosis not present

## 2018-05-25 DIAGNOSIS — M722 Plantar fascial fibromatosis: Secondary | ICD-10-CM | POA: Diagnosis not present

## 2018-07-01 ENCOUNTER — Encounter: Payer: 59 | Admitting: Obstetrics and Gynecology

## 2018-07-05 ENCOUNTER — Other Ambulatory Visit: Payer: 59 | Admitting: Orthotics

## 2018-07-08 ENCOUNTER — Encounter: Payer: 59 | Admitting: Obstetrics and Gynecology

## 2018-07-08 ENCOUNTER — Ambulatory Visit: Payer: 59 | Admitting: Internal Medicine

## 2018-07-16 ENCOUNTER — Ambulatory Visit (INDEPENDENT_AMBULATORY_CARE_PROVIDER_SITE_OTHER): Payer: Self-pay

## 2018-07-16 DIAGNOSIS — E538 Deficiency of other specified B group vitamins: Secondary | ICD-10-CM

## 2018-07-16 MED ORDER — CYANOCOBALAMIN 1000 MCG/ML IJ SOLN
1000.0000 ug | Freq: Once | INTRAMUSCULAR | Status: AC
  Administered 2018-07-16: 1000 ug via INTRAMUSCULAR

## 2018-07-16 MED ORDER — CYANOCOBALAMIN 1000 MCG/ML IJ SOLN
1000.0000 ug | Freq: Once | INTRAMUSCULAR | Status: AC
Start: 2018-07-16 — End: ?

## 2018-07-16 NOTE — Progress Notes (Signed)
Pt presents here today for visit to receive Cyanocobalamin injection 1000 mcg/mL. Allergies reviewed,injection given, injection  information statement provided, tolerated well.

## 2018-07-21 ENCOUNTER — Ambulatory Visit (INDEPENDENT_AMBULATORY_CARE_PROVIDER_SITE_OTHER): Payer: 59 | Admitting: Obstetrics and Gynecology

## 2018-07-21 ENCOUNTER — Encounter: Payer: Self-pay | Admitting: Obstetrics and Gynecology

## 2018-07-21 VITALS — BP 120/81 | HR 96 | Ht 64.0 in | Wt 238.6 lb

## 2018-07-21 DIAGNOSIS — Z01419 Encounter for gynecological examination (general) (routine) without abnormal findings: Secondary | ICD-10-CM | POA: Diagnosis not present

## 2018-07-21 MED ORDER — LEVONORGEST-ETH ESTRAD 91-DAY 0.15-0.03 &0.01 MG PO TABS: 1.0000 | ORAL_TABLET | Freq: Every day | ORAL | 4 refills | Status: DC

## 2018-07-21 NOTE — Progress Notes (Signed)
Subjective:   Katelyn Wilson is a 42 y.o. G0P0000 African American female here for a routine well-woman exam.  Patient's last menstrual period was 07/02/2018.    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  Past Medical History Past Medical History:  Diagnosis Date  . Acid reflux   . allergic rhinitis   . Anxiety   . Asthma   . Diabetes mellitus   . Hypertension   . Obesity   . Obesity (BMI 30-39.9)   . Prediabetes   . Vitamin D deficiency     Past Surgical History Past Surgical History:  Procedure Laterality Date  . none      Gynecologic History G0P0000  Patient's last menstrual period was 07/02/2018. Contraception: condoms Last Pap: 2017. Results were: normal   Obstetric History OB History  Gravida Para Term Preterm AB Living  0 0 0 0 0 0  SAB TAB Ectopic Multiple Live Births  0 0 0 0 0    Current Medications Current Outpatient Medications on File Prior to Visit  Medication Sig Dispense Refill  . cetirizine (ZYRTEC) 10 MG tablet Take 10 mg by mouth as needed.      . cyanocobalamin (,VITAMIN B-12,) 1000 MCG/ML injection inject 1000 mcg weekly x 3,  Then monthly 10 mL 0  . fluticasone (FLONASE) 50 MCG/ACT nasal spray Place 2 sprays into both nostrils daily. 16 g 6  . hydrochlorothiazide (HYDRODIURIL) 25 MG tablet Take 1 tablet (25 mg total) by mouth daily. 90 tablet 3  . VENTOLIN HFA 108 (90 Base) MCG/ACT inhaler INHALE 2 PUFFS INTO THE LUNGS EVERY SIX HOURS AS NEEDED FOR WHEEZING  OR SHORTNESS OF BREATH 18 g 2  . Vitamin D, Ergocalciferol, (DRISDOL) 50000 units CAPS capsule Take 1 capsule (50,000 Units total) by mouth 2 (two) times a week. 30 capsule 2  . diclofenac (VOLTAREN) 75 MG EC tablet Take 1 tablet (75 mg total) by mouth 2 (two) times daily. 30 tablet 0  . NONFORMULARY OR COMPOUNDED ITEM [facility]:  Combination Pain Cream - Baclofen 2%, Doxepin 5%, Gabapentin 6%, Topiramate 2%, Pentoxifylline 3%, +Ketamine 10%, apply 1-2 grams to affected area 3-4 times a day. 120 each 2   Current Facility-Administered Medications on File Prior to Visit  Medication Dose Route Frequency Provider Last Rate Last Dose  . cyanocobalamin ((VITAMIN B-12)) injection 1,000 mcg  1,000 mcg Intramuscular Once Shella Maxim, NP        Review of Systems Patient denies any headaches, blurred vision, shortness of breath, chest pain, abdominal pain, problems with bowel movements, urination, or intercourse.  Objective:  BP 120/81   Pulse 96   Ht 5\' 4"  (1.626 m)   Wt 238 lb 9.6 oz (108.2 kg)   LMP 07/02/2018   BMI 40.96 kg/m  Physical Exam  General:  Well developed, well nourished, no acute distress. She is alert and oriented x3. Skin:  Warm and dry Neck:  Midline trachea, no thyromegaly or nodules Cardiovascular: Regular rate and rhythm, no murmur heard Lungs:  Effort normal, all lung fields clear to auscultation bilaterally  Breasts:  No dominant palpable mass, retraction, or nipple discharge Abdomen:  Soft, non tender, no hepatosplenomegaly or masses Pelvic:  External genitalia is normal in appearance.  The vagina is normal in appearance. The cervix is bulbous, no CMT.  Thin prep pap is done with HR HPV cotesting. Uterus is felt to be normal size, shape, and contour.  No adnexal masses or tenderness noted. Extremities:  No swelling or varicosities noted Psych:  She has a normal mood and affect  Assessment:   Healthy well-woman exam Pre-diabetes Restart OCPs.  Plan:   Encouraged low carb diet and weight loss.  Restart OCPs F/U 1 year for AE , or sooner if needed Mammogram ordered  Mirl Hillery Rockney Ghee, CNM

## 2018-07-23 LAB — CYTOLOGY - PAP

## 2018-07-26 ENCOUNTER — Ambulatory Visit: Payer: 59 | Admitting: Orthotics

## 2018-07-26 ENCOUNTER — Telehealth: Payer: 59 | Admitting: Family

## 2018-07-26 DIAGNOSIS — M722 Plantar fascial fibromatosis: Secondary | ICD-10-CM | POA: Diagnosis not present

## 2018-07-26 DIAGNOSIS — J019 Acute sinusitis, unspecified: Secondary | ICD-10-CM

## 2018-07-26 DIAGNOSIS — M2142 Flat foot [pes planus] (acquired), left foot: Secondary | ICD-10-CM | POA: Diagnosis not present

## 2018-07-26 DIAGNOSIS — M2141 Flat foot [pes planus] (acquired), right foot: Secondary | ICD-10-CM

## 2018-07-26 DIAGNOSIS — B9689 Other specified bacterial agents as the cause of diseases classified elsewhere: Secondary | ICD-10-CM

## 2018-07-26 DIAGNOSIS — M79671 Pain in right foot: Secondary | ICD-10-CM

## 2018-07-26 MED ORDER — DOXYCYCLINE HYCLATE 100 MG PO TABS: 100.0000 mg | ORAL_TABLET | Freq: Two times a day (BID) | ORAL | 0 refills | Status: DC

## 2018-07-26 NOTE — Progress Notes (Signed)
Patient came into today to be cast for Custom Foot Orthotics. Upon recommendation of Dr. Cannon Kettle Patient presents with pes planovalgus foot pain, arch pain Goals are arch support and rear foot stability. Plan vendor [employer] to fab semi rigid device w/ deep heel cup and adequate cushioning.

## 2018-07-26 NOTE — Progress Notes (Signed)

## 2018-08-10 ENCOUNTER — Ambulatory Visit
Admission: RE | Admit: 2018-08-10 | Discharge: 2018-08-10 | Disposition: A | Discharge status: Home / Self Care | Payer: 59 | Source: Ambulatory Visit | Attending: Obstetrics and Gynecology | Admitting: Obstetrics and Gynecology

## 2018-08-10 DIAGNOSIS — Z1231 Encounter for screening mammogram for malignant neoplasm of breast: Secondary | ICD-10-CM | POA: Diagnosis not present

## 2018-08-10 DIAGNOSIS — Z01419 Encounter for gynecological examination (general) (routine) without abnormal findings: Secondary | ICD-10-CM | POA: Insufficient documentation

## 2018-08-11 ENCOUNTER — Other Ambulatory Visit: Payer: Self-pay | Admitting: Obstetrics and Gynecology

## 2018-08-11 DIAGNOSIS — R928 Other abnormal and inconclusive findings on diagnostic imaging of breast: Secondary | ICD-10-CM

## 2018-08-11 DIAGNOSIS — N6320 Unspecified lump in the left breast, unspecified quadrant: Secondary | ICD-10-CM

## 2018-08-16 ENCOUNTER — Other Ambulatory Visit: Payer: Self-pay | Admitting: Obstetrics and Gynecology

## 2018-08-16 ENCOUNTER — Ambulatory Visit: Payer: 59 | Admitting: Orthotics

## 2018-08-16 ENCOUNTER — Ambulatory Visit
Admission: RE | Admit: 2018-08-16 | Discharge: 2018-08-16 | Disposition: A | Discharge status: Home / Self Care | Payer: 59 | Source: Ambulatory Visit | Attending: Obstetrics and Gynecology | Admitting: Obstetrics and Gynecology

## 2018-08-16 DIAGNOSIS — M722 Plantar fascial fibromatosis: Secondary | ICD-10-CM

## 2018-08-16 DIAGNOSIS — N6320 Unspecified lump in the left breast, unspecified quadrant: Secondary | ICD-10-CM

## 2018-08-16 DIAGNOSIS — R928 Other abnormal and inconclusive findings on diagnostic imaging of breast: Secondary | ICD-10-CM | POA: Insufficient documentation

## 2018-08-16 DIAGNOSIS — M2142 Flat foot [pes planus] (acquired), left foot: Secondary | ICD-10-CM

## 2018-08-16 DIAGNOSIS — M2141 Flat foot [pes planus] (acquired), right foot: Secondary | ICD-10-CM

## 2018-08-16 DIAGNOSIS — N6324 Unspecified lump in the left breast, lower inner quadrant: Secondary | ICD-10-CM | POA: Diagnosis not present

## 2018-08-16 DIAGNOSIS — N6323 Unspecified lump in the left breast, lower outer quadrant: Secondary | ICD-10-CM | POA: Diagnosis not present

## 2018-08-16 DIAGNOSIS — M79671 Pain in right foot: Secondary | ICD-10-CM

## 2018-08-16 NOTE — Progress Notes (Signed)
Patient came in today to pick up custom made foot orthotics.  The goals were accomplished and the patient reported no dissatisfaction with said orthotics.  Patient was advised of breakin period and how to report any issues. 

## 2018-08-25 ENCOUNTER — Ambulatory Visit
Admission: RE | Admit: 2018-08-25 | Discharge: 2018-08-25 | Disposition: A | Discharge status: Home / Self Care | Payer: 59 | Source: Ambulatory Visit | Attending: Obstetrics and Gynecology | Admitting: Obstetrics and Gynecology

## 2018-08-25 DIAGNOSIS — N6320 Unspecified lump in the left breast, unspecified quadrant: Secondary | ICD-10-CM

## 2018-08-25 DIAGNOSIS — D242 Benign neoplasm of left breast: Secondary | ICD-10-CM | POA: Diagnosis not present

## 2018-08-25 DIAGNOSIS — R928 Other abnormal and inconclusive findings on diagnostic imaging of breast: Secondary | ICD-10-CM | POA: Diagnosis not present

## 2018-08-25 DIAGNOSIS — N6323 Unspecified lump in the left breast, lower outer quadrant: Secondary | ICD-10-CM | POA: Diagnosis not present

## 2018-08-25 DIAGNOSIS — N6324 Unspecified lump in the left breast, lower inner quadrant: Secondary | ICD-10-CM | POA: Diagnosis not present

## 2018-08-26 LAB — SURGICAL PATHOLOGY

## 2018-09-09 DIAGNOSIS — H52213 Irregular astigmatism, bilateral: Secondary | ICD-10-CM | POA: Diagnosis not present

## 2018-09-24 ENCOUNTER — Telehealth: Payer: 59 | Admitting: Family

## 2018-09-24 DIAGNOSIS — J028 Acute pharyngitis due to other specified organisms: Secondary | ICD-10-CM

## 2018-09-24 DIAGNOSIS — B9689 Other specified bacterial agents as the cause of diseases classified elsewhere: Secondary | ICD-10-CM

## 2018-09-24 MED ORDER — AZITHROMYCIN 250 MG PO TABS: ORAL_TABLET | ORAL | 0 refills | Status: DC

## 2018-09-24 MED ORDER — FLUCONAZOLE 150 MG PO TABS: 150.0000 mg | ORAL_TABLET | Freq: Once | ORAL | 0 refills | Status: AC

## 2018-09-24 MED ORDER — BENZONATATE 100 MG PO CAPS: 100.0000 mg | ORAL_CAPSULE | Freq: Three times a day (TID) | ORAL | 0 refills | Status: DC | PRN

## 2018-09-24 NOTE — Progress Notes (Signed)

## 2018-09-24 NOTE — Addendum Note (Signed)
Addended by: Guadelupe Sabin C on: 09/24/2018 11:04 AM   Modules accepted: Orders

## 2018-09-30 ENCOUNTER — Ambulatory Visit (INDEPENDENT_AMBULATORY_CARE_PROVIDER_SITE_OTHER): Payer: 59 | Admitting: Internal Medicine

## 2018-09-30 ENCOUNTER — Encounter: Payer: Self-pay | Admitting: Internal Medicine

## 2018-09-30 VITALS — BP 112/70 | HR 79 | Temp 98.2°F | Resp 14 | Ht 64.0 in | Wt 239.6 lb

## 2018-09-30 DIAGNOSIS — E119 Type 2 diabetes mellitus without complications: Secondary | ICD-10-CM

## 2018-09-30 DIAGNOSIS — J4520 Mild intermittent asthma, uncomplicated: Secondary | ICD-10-CM | POA: Diagnosis not present

## 2018-09-30 DIAGNOSIS — E538 Deficiency of other specified B group vitamins: Secondary | ICD-10-CM | POA: Diagnosis not present

## 2018-09-30 DIAGNOSIS — Z23 Encounter for immunization: Secondary | ICD-10-CM | POA: Diagnosis not present

## 2018-09-30 DIAGNOSIS — E785 Hyperlipidemia, unspecified: Secondary | ICD-10-CM | POA: Diagnosis not present

## 2018-09-30 DIAGNOSIS — E559 Vitamin D deficiency, unspecified: Secondary | ICD-10-CM

## 2018-09-30 DIAGNOSIS — I10 Essential (primary) hypertension: Secondary | ICD-10-CM

## 2018-09-30 LAB — VITAMIN D 25 HYDROXY (VIT D DEFICIENCY, FRACTURES): VITD: 70.1 ng/mL (ref 30.00–100.00)

## 2018-09-30 LAB — POCT GLYCOSYLATED HEMOGLOBIN (HGB A1C): HEMOGLOBIN A1C: 6 % — AB (ref 4.0–5.6)

## 2018-09-30 LAB — COMPREHENSIVE METABOLIC PANEL
ALBUMIN: 3.9 g/dL (ref 3.5–5.2)
ALT: 12 U/L (ref 0–35)
AST: 11 U/L (ref 0–37)
Alkaline Phosphatase: 53 U/L (ref 39–117)
BILIRUBIN TOTAL: 0.3 mg/dL (ref 0.2–1.2)
BUN: 10 mg/dL (ref 6–23)
CALCIUM: 9.3 mg/dL (ref 8.4–10.5)
CO2: 28 mEq/L (ref 19–32)
Chloride: 102 mEq/L (ref 96–112)
Creatinine, Ser: 0.86 mg/dL (ref 0.40–1.20)
GFR: 93.53 mL/min (ref >60.00)
Glucose, Bld: 122 mg/dL — ABNORMAL HIGH (ref 70–99)
POTASSIUM: 4.2 meq/L (ref 3.5–5.1)
Sodium: 137 mEq/L (ref 135–145)
Total Protein: 7.7 g/dL (ref 6.0–8.3)

## 2018-09-30 LAB — LIPID PANEL
CHOLESTEROL: 156 mg/dL (ref 0–200)
HDL: 49.1 mg/dL (ref >39.00)
LDL Cholesterol: 92 mg/dL (ref 0–99)
NONHDL: 106.63
TRIGLYCERIDES: 74 mg/dL (ref 0.0–149.0)
Total CHOL/HDL Ratio: 3
VLDL: 14.8 mg/dL (ref 0.0–40.0)

## 2018-09-30 LAB — MICROALBUMIN / CREATININE URINE RATIO
Creatinine,U: 83.5 mg/dL
MICROALB/CREAT RATIO: 0.8 mg/g (ref 0.0–30.0)
Microalb, Ur: <0.7 mg/dL (ref 0.0–1.9)

## 2018-09-30 LAB — VITAMIN B12: VITAMIN B 12: 556 pg/mL (ref 211–911)

## 2018-09-30 NOTE — Progress Notes (Signed)
Subjective:  Patient ID: Katelyn Wilson, female    DOB: 03-23-1976  Age: 42 y.o. MRN: [id number]  CC: The primary encounter diagnosis was Type 2 diabetes mellitus without complication, without long-term current use of insulin ([place]). Diagnoses of Hyperlipidemia, unspecified hyperlipidemia type, Essential hypertension, B12 deficiency, Vitamin D deficiency, Need for diphtheria-tetanus-pertussis (Tdap) vaccine, Mild intermittent asthma, unspecified whether complicated, and Morbid obesity ([place]) were also pertinent to this visit.  HPI Katelyn Wilson presents for follow up on   Type 2 DM,  Hyperlipidemia and obesity.  Since her last visit she had a breast biopsy that was  Benign,   One year follow up planned   Treated for strep pharyngitis last  Week.    Took a probiotic   Had the flu vaccine  Taking 50K once a week Vit D for the past year and B12   Has given up  soda for the past month ,  Was up to 3 cokes daily . Would drink Coke in lieu of eating.  Since she stopped drinkig soft  Drinks her  Reflux induced cough has resolved.   No longer takig a PPI   Walking daily at lunch    Saw Portia for PF   Lab Results  Component Value Date   MICROALBUR <0.7 09/30/2018   Outpatient Medications Prior to Visit  Medication Sig Dispense Refill  . cetirizine (ZYRTEC) 10 MG tablet Take 10 mg by mouth as needed.      . cyanocobalamin (,VITAMIN B-12,) 1000 MCG/ML injection inject 1000 mcg weekly x 3,  Then monthly 10 mL 0  . fluticasone (FLONASE) 50 MCG/ACT nasal spray Place 2 sprays into both nostrils daily. 16 g 6  . hydrochlorothiazide (HYDRODIURIL) 25 MG tablet Take 1 tablet (25 mg total) by mouth daily. 90 tablet 3  . Levonorgestrel-Ethinyl Estradiol (AMETHIA,CAMRESE) 0.15-0.03 &0.01 MG tablet Take 1 tablet by mouth daily. 1 Package 4  . VENTOLIN HFA 108 (90 Base) MCG/ACT inhaler INHALE 2 PUFFS INTO THE LUNGS EVERY SIX HOURS AS NEEDED FOR WHEEZING OR SHORTNESS OF BREATH 18 g 2  .  Vitamin D, Ergocalciferol, (DRISDOL) 50000 units CAPS capsule Take 1 capsule (50,000 Units total) by mouth 2 (two) times a week. 30 capsule 2  . azithromycin (ZITHROMAX) 250 MG tablet Take 2 tabs now then 1 daily times 4 days 6 tablet 0  . benzonatate (TESSALON PERLES) 100 MG capsule Take 1-2 capsules (100-200 mg total) by mouth every 8 (eight) hours as needed for cough. 30 capsule 0  . diclofenac (VOLTAREN) 75 MG EC tablet Take 1 tablet (75 mg total) by mouth 2 (two) times daily. 30 tablet 0  . doxycycline (VIBRA-TABS) 100 MG tablet Take 1 tablet (100 mg total) by mouth 2 (two) times daily. 20 tablet 0  . NONFORMULARY OR COMPOUNDED ITEM [facility]:  Combination Pain Cream - Baclofen 2%, Doxepin 5%, Gabapentin 6%, Topiramate 2%, Pentoxifylline 3%, +Ketamine 10%, apply 1-2 grams to affected area 3-4 times a day. 120 each 2   Facility-Administered Medications Prior to Visit  Medication Dose Route Frequency Provider Last Rate Last Dose  . cyanocobalamin ((VITAMIN B-12)) injection 1,000 mcg  1,000 mcg Intramuscular Once Shella Maxim, NP        Review of Systems;  Patient denies headache, fevers, malaise, unintentional weight loss, skin rash, eye pain, sinus congestion and sinus pain, sore throat, dysphagia,  hemoptysis , cough, dyspnea, wheezing, chest pain, palpitations, orthopnea, edema, abdominal pain, nausea, melena, diarrhea, constipation,  flank pain, dysuria, hematuria, urinary  Frequency, nocturia, numbness, tingling, seizures,  Focal weakness, Loss of consciousness,  Tremor, insomnia, depression, anxiety, and suicidal ideation.      Objective:  BP 112/70 (BP Location: Left Arm, Patient Position: Sitting, Cuff Size: Large)   Pulse 79   Temp 98.2 F (36.8 C) (Oral)   Resp 14   Ht 5' 4"  (1.626 m)   Wt 239 lb 9.6 oz (108.7 kg)   SpO2 99%   BMI 41.13 kg/m   BP Readings from Last 3 Encounters:  09/30/18 112/70  07/21/18 120/81  03/22/18 120/86    Wt Readings from Last 3  Encounters:  09/30/18 239 lb 9.6 oz (108.7 kg)  07/21/18 238 lb 9.6 oz (108.2 kg)  03/22/18 240 lb 6.4 oz (109 kg)    General appearance: alert, cooperative and appears stated age Ears: normal TM's and external ear canals both ears Throat: lips, mucosa, and tongue normal; teeth and gums normal Neck: no adenopathy, no carotid bruit, supple, symmetrical, trachea midline and thyroid not enlarged, symmetric, no tenderness/mass/nodules Back: symmetric, no curvature. ROM normal. No CVA tenderness. Lungs: clear to auscultation bilaterally Heart: regular rate and rhythm, S1, S2 normal, no murmur, click, rub or gallop Abdomen: soft, non-tender; bowel sounds normal; no masses,  no organomegaly Pulses: 2+ and symmetric Skin: Skin color, texture, turgor normal. No rashes or lesions Lymph nodes: Cervical, supraclavicular, and axillary nodes normal.  Lab Results  Component Value Date   HGBA1C 6.0 (A) 09/30/2018   HGBA1C 6.7 (H) 03/29/2018   HGBA1C 6.6 (H) 12/22/2017    Lab Results  Component Value Date   CREATININE 0.86 09/30/2018   CREATININE 0.69 03/29/2018   CREATININE 0.84 12/22/2017    Lab Results  Component Value Date   WBC 6.4 09/08/2017   HGB 10.8 (L) 09/08/2017   HCT 33.9 (L) 09/08/2017   PLT 463.0 (H) 09/08/2017   GLUCOSE 122 (H) 09/30/2018   CHOL 156 09/30/2018   TRIG 74.0 09/30/2018   HDL 49.10 09/30/2018   LDLDIRECT 118 06/13/2016   LDLCALC 92 09/30/2018   ALT 12 09/30/2018   AST 11 09/30/2018   NA 137 09/30/2018   K 4.2 09/30/2018   CL 102 09/30/2018   CREATININE 0.86 09/30/2018   BUN 10 09/30/2018   CO2 28 09/30/2018   TSH 1.070 12/23/2016   HGBA1C 6.0 (A) 09/30/2018   MICROALBUR <0.7 09/30/2018    Mm Clip Placement Left  Result Date: 08/25/2018 CLINICAL DATA:  Post biopsy mammogram of the left breast for clip placement. EXAM: DIAGNOSTIC LEFT MAMMOGRAM POST ULTRASOUND BIOPSY COMPARISON:  Previous exam(s). FINDINGS: Mammographic images were obtained  following ultrasound guided biopsy of left breast mass at 6 o'clock. The Venus shaped biopsy marker is appropriately positioned within the mass in the retroareolar left breast at 6 o'clock. IMPRESSION: Appropriate positioning of the Venus shaped biopsy marker in the inferior left breast at 6 o'clock. Final Assessment: Post Procedure Mammograms for Marker Placement Electronically Signed   By: Ammie Ferrier M.D.   On: 08/25/2018 08:54   [place] Lt Breast Bx W Loc Dev 1st Lesion Img Bx Spec US Guide  Addendum Date: 08/26/2018   ADDENDUM REPORT: 08/26/2018 15:53 ADDENDUM: Pathology of the left breast biopsy revealed A. BREAST, LEFT 6:00 SUBAREOLAR; ULTRASOUND-GUIDED BIOPSY: FIBROADENOMA WITH FOCAL APOCRINE METAPLASIA AND INVOLUTIONAL LOBULAR CHANGE. NEGATIVE FOR ATYPIA AND MALIGNANCY. This was found to be concordant by Dr. Theda Sers. Recommendation: Routine screening mammogram in one year. At the patient's request, results and recommendations  were relayed to the patient by phone by Jetta Lout, [place] on 08/26/18. The patient stated she did well following the biopsy with a very small amount of bleeding but no bruising or pain. Post biopsy instructions were reviewed with the patient and all of her questions were answered. She was encouraged to contact the [facility] with any further questions or concerns. Addendum by Jetta Lout, RRA on 08/26/18. Electronically Signed   By: Ammie Ferrier M.D.   On: 08/26/2018 15:53   Addendum Date: 08/25/2018   ADDENDUM REPORT: 08/25/2018 08:54 ADDENDUM: Correction to the type of biopsy marker used. The biopsy marking clip shape is Venus shaped, not heart shaped. Electronically Signed   By: Ammie Ferrier M.D.   On: 08/25/2018 08:54   Result Date: 08/26/2018 CLINICAL DATA:  42 year old female presenting for ultrasound-guided biopsy of a left breast mass. EXAM: ULTRASOUND GUIDED LEFT BREAST CORE NEEDLE BIOPSY COMPARISON:  Previous exam(s). FINDINGS: I met with the  patient and we discussed the procedure of ultrasound-guided biopsy, including benefits and alternatives. We discussed the high likelihood of a successful procedure. We discussed the risks of the procedure, including infection, bleeding, tissue injury, clip migration, and inadequate sampling. Informed written consent was given. The usual time-out protocol was performed immediately prior to the procedure. Lesion quadrant: Lower-inner quadrant Using sterile technique and 1% Lidocaine as local anesthetic, under direct ultrasound visualization, a 14 gauge spring-loaded device was used to perform biopsy of a mass in the left breast at 6 o'clock using a an inferolateral approach. At the conclusion of the procedure a heart shaped tissue marker clip was deployed into the biopsy cavity. Follow up 2 view mammogram was performed and dictated separately. IMPRESSION: Ultrasound guided biopsy of a left breast mass at 6 o'clock. No apparent complications. Electronically Signed: By: Ammie Ferrier M.D. On: 08/25/2018 08:41    Assessment & Plan:   Problem List Items Addressed This Visit    Asthma    No recent exaerbations. She is not using a maintenance inhaler.       B12 deficiency   Relevant Orders   Vitamin B12 (Completed)   High blood pressure    Managed with hctz.   No changes today  Lab Results  Component Value Date   CREATININE 0.86 09/30/2018   Lab Results  Component Value Date   NA 137 09/30/2018   K 4.2 09/30/2018   CL 102 09/30/2018   CO2 28 09/30/2018         Relevant Orders   Comprehensive metabolic panel (Completed)   Hyperlipidemia    LDL and triglycerides are at goal without  medications.   Lab Results  Component Value Date   CHOL 156 09/30/2018   HDL 49.10 09/30/2018   LDLCALC 92 09/30/2018   LDLDIRECT 118 06/13/2016   TRIG 74.0 09/30/2018   CHOLHDL 3 09/30/2018         Relevant Orders   Lipid panel (Completed)   Morbid obesity ([place])    She has had no success with  weight loss despite referral to Weight management      Type 2 diabetes mellitus without complication, without long-term current use of insulin ([place]) - Primary    Currently well-controlled on diet alone  .  hemoglobin A1c is at goal of less than 7.0 . Patient is reminded to schedule an annual eye exam and foot exam is normal today. Patient has no microalbuminuria. Patient is tolerating statin therapy for CAD risk reduction and on ACE/ARB for  renal protection and hypertension .  Lab Results  Component Value Date   HGBA1C 6.0 (A) 09/30/2018   Lab Results  Component Value Date   MICROALBUR <0.7 09/30/2018         Relevant Orders   POCT HgB A1C (Completed)   Microalbumin / creatinine urine ratio (Completed)   Vitamin D deficiency   Relevant Orders   VITAMIN D 25 Hydroxy (Vit-D Deficiency, Fractures) (Completed)    Other Visit Diagnoses    Need for diphtheria-tetanus-pertussis (Tdap) vaccine       Relevant Orders   Tdap vaccine greater than or equal to 7yo IM (Completed)    A total of 25 minutes of face to face time was spent with patient more than half of which was spent in counselling about the above mentioned conditions  and coordination of care   I have discontinued Tearia L. Hinderliter's diclofenac, NONFORMULARY OR COMPOUNDED ITEM, doxycycline, azithromycin, and benzonatate. I am also having her maintain her cetirizine, Vitamin D (Ergocalciferol), VENTOLIN HFA, fluticasone, cyanocobalamin, hydrochlorothiazide, and Levonorgestrel-Ethinyl Estradiol. We will continue to administer cyanocobalamin.  No orders of the defined types were placed in this encounter.   Medications Discontinued During This Encounter  Medication Reason  . diclofenac (VOLTAREN) 75 MG EC tablet   . NONFORMULARY OR COMPOUNDED ITEM   . doxycycline (VIBRA-TABS) 100 MG tablet   . benzonatate (TESSALON PERLES) 100 MG capsule   . azithromycin (ZITHROMAX) 250 MG tablet     Follow-up: Return in about 6 months (around  04/01/2019) for follow up diabetes.   Crecencio Mc, MD

## 2018-09-30 NOTE — Patient Instructions (Addendum)
Well done!!!!!  You have dropped your A1c to 6.0 from 6.7  By giving up Coke!   See you in 6 months. If you want to have labs done in January (to keep you on track over the holidays), you can have  Them after Jan 24th

## 2018-10-02 NOTE — Assessment & Plan Note (Signed)
Currently well-controlled on diet alone  .  hemoglobin A1c is at goal of less than 7.0 . Patient is reminded to schedule an annual eye exam and foot exam is normal today. Patient has no microalbuminuria. Patient is tolerating statin therapy for CAD risk reduction and on ACE/ARB for renal protection and hypertension .  Lab Results  Component Value Date   HGBA1C 6.0 (A) 09/30/2018   Lab Results  Component Value Date   MICROALBUR <0.7 09/30/2018

## 2018-10-02 NOTE — Assessment & Plan Note (Signed)
She has had no success with weight loss despite referral to Weight management

## 2018-10-02 NOTE — Assessment & Plan Note (Signed)
Managed with hctz.   No changes today  Lab Results  Component Value Date   CREATININE 0.86 09/30/2018   Lab Results  Component Value Date   NA 137 09/30/2018   K 4.2 09/30/2018   CL 102 09/30/2018   CO2 28 09/30/2018

## 2018-10-02 NOTE — Assessment & Plan Note (Signed)
No recent exaerbations. She is not using a maintenance inhaler.

## 2018-10-02 NOTE — Assessment & Plan Note (Signed)
She continues to use OCPS  LFTs are normal.  . Lab Results  Component Value Date   ALT 12 09/30/2018   AST 11 09/30/2018   ALKPHOS 53 09/30/2018   BILITOT 0.3 09/30/2018

## 2018-10-02 NOTE — Assessment & Plan Note (Signed)
LDL and triglycerides are at goal without  medications.   Lab Results  Component Value Date   CHOL 156 09/30/2018   HDL 49.10 09/30/2018   LDLCALC 92 09/30/2018   LDLDIRECT 118 06/13/2016   TRIG 74.0 09/30/2018   CHOLHDL 3 09/30/2018

## 2018-11-05 ENCOUNTER — Encounter: Payer: Self-pay | Admitting: Sports Medicine

## 2018-11-16 ENCOUNTER — Encounter: Payer: Self-pay | Admitting: Sports Medicine

## 2018-11-16 ENCOUNTER — Ambulatory Visit: Payer: 59 | Admitting: Sports Medicine

## 2018-11-16 ENCOUNTER — Ambulatory Visit (INDEPENDENT_AMBULATORY_CARE_PROVIDER_SITE_OTHER): Payer: 59

## 2018-11-16 DIAGNOSIS — M79672 Pain in left foot: Secondary | ICD-10-CM | POA: Diagnosis not present

## 2018-11-16 DIAGNOSIS — M79671 Pain in right foot: Secondary | ICD-10-CM

## 2018-11-16 DIAGNOSIS — M2141 Flat foot [pes planus] (acquired), right foot: Secondary | ICD-10-CM | POA: Diagnosis not present

## 2018-11-16 DIAGNOSIS — M2142 Flat foot [pes planus] (acquired), left foot: Secondary | ICD-10-CM | POA: Diagnosis not present

## 2018-11-16 DIAGNOSIS — M21619 Bunion of unspecified foot: Secondary | ICD-10-CM | POA: Diagnosis not present

## 2018-11-16 NOTE — Progress Notes (Signed)
Subjective: Katelyn Wilson is a 42 y.o. female patient who presents to office for evaluation of Left>Right bunion pain. Patient complains of progressive pain especially over the last year in the Left>Right  foot that starts as pain over the bump with direct pressure and range of motion; patient now has difficulty fitting shoes comfortably. Ranks pain 8/10 and is now interferring with daily activities especially when it is at its worse. Patient has tried changing in shoes with no relief. No pain to right arch or re-occurrence of fasciitis. Patient reports that A1c 6 not on diabetic meds and still on blood pressure meds even though is well controlled. Patient denies any other pedal complaints.   Patient Active Problem List   Diagnosis Date Noted  . B12 deficiency 09/09/2017  . Hyperlipidemia 02/03/2017  . Shortness of breath 12/23/2016  . Type 2 diabetes mellitus without complication, without long-term current use of insulin ([facility]) 12/23/2016  . Vitamin D deficiency 12/02/2016  . Cough variant asthma 12/05/2015  . Asthma 11/12/2015  . Menometrorrhagia 08/07/2014  . Morbid obesity ([facility]) 10/23/2013  . Fibroid uterus 06/22/2013  . Contraceptive management 11/16/2011  . Anxiety   . High blood pressure 06/18/2011  . Family history of hypertrophic cardiomyopathy 06/18/2011    Current Outpatient Medications on File Prior to Visit  Medication Sig Dispense Refill  . cetirizine (ZYRTEC) 10 MG tablet Take 10 mg by mouth as needed.      . cyanocobalamin (,VITAMIN B-12,) 1000 MCG/ML injection inject 1000 mcg weekly x 3,  Then monthly 10 mL 0  . fluticasone (FLONASE) 50 MCG/ACT nasal spray Place 2 sprays into both nostrils daily. 16 g 6  . hydrochlorothiazide (HYDRODIURIL) 25 MG tablet Take 1 tablet (25 mg total) by mouth daily. 90 tablet 3  . Levonorgestrel-Ethinyl Estradiol (AMETHIA,CAMRESE) 0.15-0.03 &0.01 MG tablet Take 1 tablet by mouth daily. 1 Package 4  . VENTOLIN HFA 108 (90 Base) MCG/ACT  inhaler INHALE 2 PUFFS INTO THE LUNGS EVERY SIX HOURS AS NEEDED FOR WHEEZING OR SHORTNESS OF BREATH 18 g 2  . Vitamin D, Ergocalciferol, (DRISDOL) 50000 units CAPS capsule Take 1 capsule (50,000 Units total) by mouth 2 (two) times a week. 30 capsule 2   Current Facility-Administered Medications on File Prior to Visit  Medication Dose Route Frequency Provider Last Rate Last Dose  . cyanocobalamin ((VITAMIN B-12)) injection 1,000 mcg  1,000 mcg Intramuscular Once Graham, Elysa, NP        Allergies  Allergen Reactions  . Penicillins Rash    Amoxicillin    Objective:  General: Alert and oriented x3 in no acute distress  Dermatology: No open lesions bilateral lower extremities, no webspace macerations, no ecchymosis bilateral, all nails x 10 are well manicured.  Vascular: Dorsalis Pedis and Posterior Tibial pedal pulses 2/4, Capillary Fill Time 3 seconds, (+) pedal hair growth bilateral, no edema bilateral lower extremities, Temperature gradient within normal limits.  Neurology: Gross sensation intact via light touch bilateral.  Musculoskeletal: Mild tenderness with palpation left>right bunion deformity, no limitation or crepitus with range of motion, deformity reducible, tracking not trackbound, there is no 1st ray hypermobility noted bilateral. Midtarsal, Subtalar joint, and ankle joint range of motion is within normal limits. 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   Xrays  Right/Left Foot    Impression: Intermetatarsal angle above normal limits w/ bunion and cyst at 1st met head L>R and pronation of hallux.         Assessment and Plan: Problem List Items Addressed This Visit    None    Visit Diagnoses    Bunion    -  Primary   Relevant Orders   DG Foot Complete Right (Completed)   DG Foot Complete Left (Completed)   Foot pain, left       Foot pain, right           -Complete examination  performed -Xrays reviewed -Discussed treatement options; discussed HAV deformity;conservative and  Surgical management; risks, benefits, alternatives discussed. All patient's questions answered. -Patient opt for surgical management. Consent obtained for Bunionectomy kalish akin on left. Pre and Post op course explained. Risks, benefits, alternatives explained. No guarantees given or implied. Surgical booking slip submitted and provided patient with Surgical packet and info for [facility] with H&P form; patient requests surgery on 12-23 -Rx Knee scooter and will dispense cam boot and crutches at [facility] -Advised patient to take time from work expect 12 weeks to make a full recovery. Patient may work from home after 2 weeks but must wait at least 4-6 weeks before attempting to go in to office  -Patient to return to office after surgery or sooner if condition worsens.  Landis Martins, DPM

## 2018-11-16 NOTE — Patient Instructions (Signed)
Pre-Operative Instructions  Congratulations, you have decided to take an important step towards improving your quality of life.  You can be assured that the doctors and staff at [facility] will be with you every step of the way.  Here are some important things you should know:  1. Plan to be at the surgery center/hospital at least 1 (one) hour prior to your scheduled time, unless otherwise directed by the surgical center/hospital staff.  You must have a responsible adult accompany you, remain during the surgery and drive you home.  Make sure you have directions to the surgical center/hospital to ensure you arrive on time. 2. If you are having surgery at [facility] or [facility], you will need a copy of your medical history and physical form from your family physician within one month prior to the date of surgery. We will give you a form for your primary physician to complete.  3. We make every effort to accommodate the date you request for surgery.  However, there are times where surgery dates or times have to be moved.  We will contact you as soon as possible if a change in schedule is required.   4. No aspirin/ibuprofen for one week before surgery.  If you are on aspirin, any non-steroidal anti-inflammatory medications (Mobic, Aleve, Ibuprofen) should not be taken seven (7) days prior to your surgery.  You make take Tylenol for pain prior to surgery.  5. Medications - If you are taking daily heart and blood pressure medications, seizure, reflux, allergy, asthma, anxiety, pain or diabetes medications, make sure you notify the surgery center/hospital before the day of surgery so they can tell you which medications you should take or avoid the day of surgery. 6. No food or drink after midnight the night before surgery unless directed otherwise by surgical center/hospital staff. 7. No alcoholic beverages 24-hours prior to surgery.  No smoking 24-hours prior or 24-hours after  surgery. 8. Wear loose pants or shorts. They should be loose enough to fit over bandages, boots, and casts. 9. Don't wear slip-on shoes. Sneakers are preferred. 10. Bring your boot with you to the surgery center/hospital.  Also bring crutches or a walker if your physician has prescribed it for you.  If you do not have this equipment, it will be provided for you after surgery. 11. If you have not been contacted by the surgery center/hospital by the day before your surgery, call to confirm the date and time of your surgery. 12. Leave-time from work may vary depending on the type of surgery you have.  Appropriate arrangements should be made prior to surgery with your employer. 13. Prescriptions will be provided immediately following surgery by your doctor.  Fill these as soon as possible after surgery and take the medication as directed. Pain medications will not be refilled on weekends and must be approved by the doctor. 14. Remove nail polish on the operative foot and avoid getting pedicures prior to surgery. 15. Wash the night before surgery.  The night before surgery wash the foot and leg well with water and the antibacterial soap provided. Be sure to pay special attention to beneath the toenails and in between the toes.  Wash for at least three (3) minutes. Rinse thoroughly with water and dry well with a towel.  Perform this wash unless told not to do so by your physician.  Enclosed: 1 Ice pack (please put in freezer the night before surgery)   1 Hibiclens skin cleaner     Pre-op instructions  If you have any questions regarding the instructions, please do not hesitate to call our office.  [place]: [address], , [place] [address] -- [phone]  [place]: [address] [place], [place] [address] -- [phone]  [place]: [address]  [place], [place] [address] -- [phone]  [place]: [address], [address], [place],  [address] -- [phone]  Website:  [email or web address] 

## 2018-11-17 ENCOUNTER — Telehealth: Payer: Self-pay | Admitting: *Deleted

## 2018-11-17 ENCOUNTER — Telehealth: Payer: Self-pay | Admitting: Sports Medicine

## 2018-11-17 NOTE — Telephone Encounter (Signed)
I'm calling to solidify my surgery at [facility] for Monday 23 December with Dr. Cannon Kettle. Please call me back on my cell [phone] or at work [phone].

## 2018-11-17 NOTE — Telephone Encounter (Signed)
Called pt to let her know I got her message and I will call and get her surgery scheduled at [facility] on Monday, 23 December either this afternoon or tomorrow morning. I told the pt I would call her back once I got her surgery scheduled.

## 2018-11-17 NOTE — Telephone Encounter (Signed)
Pt is not taking trulicity.

## 2018-11-17 NOTE — Telephone Encounter (Signed)
Copied from [place] [id number]. Topic: General - Other >> Nov 17, 2018  9:27 AM Cecelia Byars, NT wrote: Reason for CRM: Louie Casa from cover my meds called and said a PA for trulicity 7.41 mcg was created for renewal and ref  [id number] please call him at [phone]

## 2018-11-18 ENCOUNTER — Encounter: Payer: Self-pay | Admitting: Internal Medicine

## 2018-11-18 ENCOUNTER — Telehealth: Payer: Self-pay | Admitting: *Deleted

## 2018-11-18 ENCOUNTER — Encounter: Payer: Self-pay | Admitting: Sports Medicine

## 2018-11-18 DIAGNOSIS — M21619 Bunion of unspecified foot: Secondary | ICD-10-CM

## 2018-11-18 DIAGNOSIS — Z9889 Other specified postprocedural states: Secondary | ICD-10-CM

## 2018-11-18 NOTE — Telephone Encounter (Signed)
Faxed order for knee scooter to [facility], emailed to A. Barnet Glasgow.

## 2018-11-18 NOTE — Telephone Encounter (Signed)
-----   Message from Landis Martins, [place] sent at 11/16/2018  5:40 PM EST ----- Regarding: Knee scooter  Patient scheduled for bunionectomy tentative on 12-23 and would like knee scooter -Dr. Cannon Kettle

## 2018-11-18 NOTE — Telephone Encounter (Signed)
See Patient Message dated 11/18/2018.

## 2018-11-22 ENCOUNTER — Ambulatory Visit: Payer: 59 | Admitting: Internal Medicine

## 2018-11-22 ENCOUNTER — Encounter: Payer: Self-pay | Admitting: Internal Medicine

## 2018-11-22 DIAGNOSIS — Z01818 Encounter for other preprocedural examination: Secondary | ICD-10-CM | POA: Diagnosis not present

## 2018-11-22 DIAGNOSIS — M79676 Pain in unspecified toe(s): Secondary | ICD-10-CM

## 2018-11-22 NOTE — Progress Notes (Signed)
Subjective:  Patient ID: Katelyn Wilson, female    DOB: 04/03/1976  Age: 42 y.o. MRN: [id number]  CC: The encounter diagnosis was Preoperative clearance.  HPI Katelyn Wilson presents for preoperative clearance for elective outpatient surgery at [facility]  on Dec 23.   The surgery planned  is  Left bunionectomy and pinning.  She will have conscious sedation and nerve block.   Patient has diet controlled dm, hypertension  and asthma,  But denies any recent use of bronchodilator,  No insulin ,  And denies any recent history of cough,  Chest pain or shortness of breath .  She has normal renal function and no history of CAD   Active Ambulatory Problems    Diagnosis Date Noted  . High blood pressure 06/18/2011  . Family history of hypertrophic cardiomyopathy 06/18/2011  . Anxiety   . Contraceptive management 11/16/2011  . Fibroid uterus 06/22/2013  . Morbid obesity ([place]) 10/23/2013  . Menometrorrhagia 08/07/2014  . Asthma 11/12/2015  . Cough variant asthma 12/05/2015  . Vitamin D deficiency 12/02/2016  . Shortness of breath 12/23/2016  . Type 2 diabetes mellitus without complication, without long-term current use of insulin ([place]) 12/23/2016  . Hyperlipidemia 02/03/2017  . B12 deficiency 09/09/2017  . Preoperative clearance 11/22/2018   Resolved Ambulatory Problems    Diagnosis Date Noted  . Hypertension 11/14/2011  . Obesity (BMI 30-39.9)   . Cough 11/16/2011  . Prediabetes 11/16/2011  . Back pain 01/01/2012  . Spasm of muscle, back 01/02/2012  . URI (upper respiratory infection) 08/31/2012  . Cellulitis and abscess of trunk 03/08/2014  . Abnormal chest xray 06/15/2016  . Effusion of olecranon bursa, right 10/15/2016  . Class 2 obesity without serious comorbidity with body mass index (BMI) of 37.0 to 37.9 in adult 02/03/2017   Past Medical History:  Diagnosis Date  . Acid reflux   . allergic rhinitis   . Diabetes mellitus   . Obesity    Family History  Problem Relation  Age of Onset  . Heart disease Paternal Aunt 75       sudden death  . Heart disease Father        sudden cardiac death  . Diabetes Father   . Hypertension Father   . Sleep apnea Father   . Diabetes Unknown   . Lung cancer Unknown   . Cancer Unknown   . Diabetes Mother   . Sleep apnea Mother   . Obesity Mother   . Cancer Maternal Grandmother   . Cancer Maternal Grandfather   . Breast cancer Neg Hx    Allergies  Allergen Reactions  . Penicillins Rash    Amoxicillin      Outpatient Medications Prior to Visit  Medication Sig Dispense Refill  . cetirizine (ZYRTEC) 10 MG tablet Take 10 mg by mouth as needed.      . cyanocobalamin (,VITAMIN B-12,) 1000 MCG/ML injection inject 1000 mcg weekly x 3,  Then monthly 10 mL 0  . hydrochlorothiazide (HYDRODIURIL) 25 MG tablet Take 1 tablet (25 mg total) by mouth daily. 90 tablet 3  . Vitamin D, Ergocalciferol, (DRISDOL) 50000 units CAPS capsule Take 1 capsule (50,000 Units total) by mouth 2 (two) times a week. 30 capsule 2  . fluticasone (FLONASE) 50 MCG/ACT nasal spray Place 2 sprays into both nostrils daily. 16 g 6  . Levonorgestrel-Ethinyl Estradiol (AMETHIA,CAMRESE) 0.15-0.03 &0.01 MG tablet Take 1 tablet by mouth daily. 1 Package 4  . VENTOLIN HFA 108 (90 Base)  MCG/ACT inhaler INHALE 2 PUFFS INTO THE LUNGS EVERY SIX HOURS AS NEEDED FOR WHEEZING OR SHORTNESS OF BREATH 18 g 2   Facility-Administered Medications Prior to Visit  Medication Dose Route Frequency Provider Last Rate Last Dose  . cyanocobalamin ((VITAMIN B-12)) injection 1,000 mcg  1,000 mcg Intramuscular Once Shella Maxim, NP        Review of Systems;  Patient denies headache, fevers, malaise, unintentional weight loss, skin rash, eye pain, sinus congestion and sinus pain, sore throat, dysphagia,  hemoptysis , cough, dyspnea, wheezing, chest pain, palpitations, orthopnea, edema, abdominal pain, nausea, melena, diarrhea, constipation, flank pain, dysuria, hematuria, urinary   Frequency, nocturia, numbness, tingling, seizures,  Focal weakness, Loss of consciousness,  Tremor, insomnia, depression, anxiety, and suicidal ideation.      Objective:  BP 126/88 (BP Location: Left Arm, Patient Position: Sitting, Cuff Size: Large)   Pulse 82   Temp 98.2 F (36.8 C) (Oral)   Resp 15   Ht 5\' 4"  (1.626 m)   Wt 238 lb 9.6 oz (108.2 kg)   SpO2 94%   BMI 40.96 kg/m   BP Readings from Last 3 Encounters:  11/22/18 126/88  09/30/18 112/70  07/21/18 120/81    Wt Readings from Last 3 Encounters:  11/22/18 238 lb 9.6 oz (108.2 kg)  09/30/18 239 lb 9.6 oz (108.7 kg)  07/21/18 238 lb 9.6 oz (108.2 kg)    General appearance: alert, cooperative and appears stated age Ears: normal TM's and external ear canals both ears Throat: lips, mucosa, and tongue normal; teeth and gums normal Neck: no adenopathy, no carotid bruit, supple, symmetrical, trachea midline and thyroid not enlarged, symmetric, no tenderness/mass/nodules Back: symmetric, no curvature. ROM normal. No CVA tenderness. Lungs: clear to auscultation bilaterally Heart: regular rate and rhythm, S1, S2 normal, no murmur, click, rub or gallop Abdomen: soft, non-tender; bowel sounds normal; no masses,  no organomegaly Pulses: 2+ and symmetric Skin: Skin color, texture, turgor normal. No rashes or lesions Lymph nodes: Cervical, supraclavicular, and axillary nodes normal.  Lab Results  Component Value Date   HGBA1C 6.0 (A) 09/30/2018   HGBA1C 6.7 (H) 03/29/2018   HGBA1C 6.6 (H) 12/22/2017    Lab Results  Component Value Date   CREATININE 0.86 09/30/2018   CREATININE 0.69 03/29/2018   CREATININE 0.84 12/22/2017    Lab Results  Component Value Date   WBC 6.4 09/08/2017   HGB 10.8 (L) 09/08/2017   HCT 33.9 (L) 09/08/2017   PLT 463.0 (H) 09/08/2017   GLUCOSE 122 (H) 09/30/2018   CHOL 156 09/30/2018   TRIG 74.0 09/30/2018   HDL 49.10 09/30/2018   LDLDIRECT 118 06/13/2016   LDLCALC 92 09/30/2018   ALT 12  09/30/2018   AST 11 09/30/2018   NA 137 09/30/2018   K 4.2 09/30/2018   CL 102 09/30/2018   CREATININE 0.86 09/30/2018   BUN 10 09/30/2018   CO2 28 09/30/2018   TSH 1.070 12/23/2016   HGBA1C 6.0 (A) 09/30/2018   MICROALBUR <0.7 09/30/2018    Assessment & Plan:   Problem List Items Addressed This Visit    Preoperative clearance    Patient  is considered to be at low risk  For perioperative complications  Based on today's exam and history.  A1c, renal function  lytes,  hgb  From October visit have been reviewed and are normal.           I have discontinued Darnesha L. Dunton's VENTOLIN HFA, fluticasone, and Levonorgestrel-Ethinyl Estradiol. I am  also having her maintain her cetirizine, Vitamin D (Ergocalciferol), cyanocobalamin, and hydrochlorothiazide. We will continue to administer cyanocobalamin.  No orders of the defined types were placed in this encounter.   Medications Discontinued During This Encounter  Medication Reason  . VENTOLIN HFA 108 (90 Base) MCG/ACT inhaler Error  . fluticasone (FLONASE) 50 MCG/ACT nasal spray Error  . Levonorgestrel-Ethinyl Estradiol (AMETHIA,CAMRESE) 0.15-0.03 &0.01 MG tablet Error    Follow-up: No follow-ups on file.   Crecencio Mc, MD

## 2018-11-22 NOTE — Assessment & Plan Note (Addendum)
Patient  is considered to be at low risk  For perioperative complications  Based on today's exam and history.  A1c, renal function  lytes,  hgb  From October visit have been reviewed and are normal.

## 2018-11-23 ENCOUNTER — Other Ambulatory Visit: Payer: Self-pay

## 2018-11-23 ENCOUNTER — Encounter (HOSPITAL_BASED_OUTPATIENT_CLINIC_OR_DEPARTMENT_OTHER): Payer: Self-pay | Admitting: *Deleted

## 2018-11-24 ENCOUNTER — Telehealth: Payer: Self-pay | Admitting: *Deleted

## 2018-11-24 ENCOUNTER — Encounter (HOSPITAL_BASED_OUTPATIENT_CLINIC_OR_DEPARTMENT_OTHER)
Admission: RE | Admit: 2018-11-24 | Discharge: 2018-11-24 | Disposition: A | Discharge status: Home / Self Care | Payer: 59 | Source: Ambulatory Visit | Attending: Sports Medicine | Admitting: Sports Medicine

## 2018-11-24 ENCOUNTER — Encounter: Payer: Self-pay | Admitting: Sports Medicine

## 2018-11-24 DIAGNOSIS — Z01810 Encounter for preprocedural cardiovascular examination: Secondary | ICD-10-CM | POA: Insufficient documentation

## 2018-11-24 LAB — BASIC METABOLIC PANEL
Anion gap: 12 (ref 5–15)
BUN: 10 mg/dL (ref 6–20)
CHLORIDE: 101 mmol/L (ref 98–111)
CO2: 24 mmol/L (ref 22–32)
Calcium: 9.2 mg/dL (ref 8.9–10.3)
Creatinine, Ser: 0.84 mg/dL (ref 0.44–1.00)
GFR calc Af Amer: >60 mL/min (ref >60)
GFR calc non Af Amer: >60 mL/min (ref >60)
Glucose, Bld: 144 mg/dL — ABNORMAL HIGH (ref 70–99)
POTASSIUM: 4.1 mmol/L (ref 3.5–5.1)
SODIUM: 137 mmol/L (ref 135–145)

## 2018-11-24 NOTE — Telephone Encounter (Signed)
Pt states she would like to talk to Dr. Cannon Kettle prior to her surgery 11/29/2018 concerning FMLA and work status.

## 2018-11-26 ENCOUNTER — Encounter (HOSPITAL_BASED_OUTPATIENT_CLINIC_OR_DEPARTMENT_OTHER): Payer: Self-pay

## 2018-11-29 ENCOUNTER — Ambulatory Visit (HOSPITAL_BASED_OUTPATIENT_CLINIC_OR_DEPARTMENT_OTHER): Payer: 59 | Admitting: Anesthesiology

## 2018-11-29 ENCOUNTER — Encounter (HOSPITAL_BASED_OUTPATIENT_CLINIC_OR_DEPARTMENT_OTHER)
Admission: RE | Disposition: A | Discharge status: Home / Self Care | Payer: Self-pay | Source: Home / Self Care | Attending: Sports Medicine

## 2018-11-29 ENCOUNTER — Other Ambulatory Visit: Payer: Self-pay

## 2018-11-29 ENCOUNTER — Encounter (HOSPITAL_BASED_OUTPATIENT_CLINIC_OR_DEPARTMENT_OTHER): Payer: Self-pay | Admitting: Emergency Medicine

## 2018-11-29 ENCOUNTER — Other Ambulatory Visit: Payer: Self-pay | Admitting: Sports Medicine

## 2018-11-29 ENCOUNTER — Ambulatory Visit (HOSPITAL_BASED_OUTPATIENT_CLINIC_OR_DEPARTMENT_OTHER)
Admission: RE | Admit: 2018-11-29 | Discharge: 2018-11-29 | Disposition: A | Discharge status: Home / Self Care | Payer: 59 | Attending: Sports Medicine | Admitting: Sports Medicine

## 2018-11-29 DIAGNOSIS — E119 Type 2 diabetes mellitus without complications: Secondary | ICD-10-CM | POA: Insufficient documentation

## 2018-11-29 DIAGNOSIS — Z6841 Body Mass Index (BMI) 40.0 and over, adult: Secondary | ICD-10-CM | POA: Diagnosis not present

## 2018-11-29 DIAGNOSIS — G8918 Other acute postprocedural pain: Secondary | ICD-10-CM | POA: Diagnosis not present

## 2018-11-29 DIAGNOSIS — E538 Deficiency of other specified B group vitamins: Secondary | ICD-10-CM | POA: Insufficient documentation

## 2018-11-29 DIAGNOSIS — M2042 Other hammer toe(s) (acquired), left foot: Secondary | ICD-10-CM | POA: Diagnosis not present

## 2018-11-29 DIAGNOSIS — J45909 Unspecified asthma, uncomplicated: Secondary | ICD-10-CM | POA: Diagnosis not present

## 2018-11-29 DIAGNOSIS — Z9889 Other specified postprocedural states: Secondary | ICD-10-CM

## 2018-11-29 DIAGNOSIS — E559 Vitamin D deficiency, unspecified: Secondary | ICD-10-CM | POA: Diagnosis not present

## 2018-11-29 DIAGNOSIS — Z79899 Other long term (current) drug therapy: Secondary | ICD-10-CM | POA: Diagnosis not present

## 2018-11-29 DIAGNOSIS — M205X2 Other deformities of toe(s) (acquired), left foot: Secondary | ICD-10-CM | POA: Insufficient documentation

## 2018-11-29 DIAGNOSIS — M2012 Hallux valgus (acquired), left foot: Secondary | ICD-10-CM | POA: Insufficient documentation

## 2018-11-29 DIAGNOSIS — I10 Essential (primary) hypertension: Secondary | ICD-10-CM | POA: Diagnosis not present

## 2018-11-29 HISTORY — PX: BUNIONECTOMY: SHX129

## 2018-11-29 HISTORY — DX: Hallux valgus (acquired), left foot: M20.12

## 2018-11-29 HISTORY — PX: AIKEN OSTEOTOMY: SHX6331

## 2018-11-29 HISTORY — DX: Prediabetes: R73.03

## 2018-11-29 LAB — POCT PREGNANCY, URINE: Preg Test, Ur: NEGATIVE

## 2018-11-29 SURGERY — BUNIONECTOMY
Anesthesia: General | Site: Foot | Laterality: Left

## 2018-11-29 MED ORDER — LACTATED RINGERS IV SOLN
INTRAVENOUS | Status: DC
  Administered 2018-11-29 (×2): via INTRAVENOUS

## 2018-11-29 MED ORDER — MIDAZOLAM HCL 2 MG/2ML IJ SOLN
INTRAMUSCULAR | Status: AC
  Filled 2018-11-29: qty 2

## 2018-11-29 MED ORDER — PROPOFOL 10 MG/ML IV BOLUS
INTRAVENOUS | Status: DC | PRN
  Administered 2018-11-29: 50 mg via INTRAVENOUS
  Administered 2018-11-29 (×2): 200 mg via INTRAVENOUS

## 2018-11-29 MED ORDER — CLINDAMYCIN PHOSPHATE 900 MG/50ML IV SOLN
INTRAVENOUS | Status: AC
  Filled 2018-11-29: qty 50

## 2018-11-29 MED ORDER — ROPIVACAINE HCL 5 MG/ML IJ SOLN
INTRAMUSCULAR | Status: DC | PRN
  Administered 2018-11-29: 20 mL via PERINEURAL

## 2018-11-29 MED ORDER — FENTANYL CITRATE (PF) 100 MCG/2ML IJ SOLN
INTRAMUSCULAR | Status: AC
  Filled 2018-11-29: qty 2

## 2018-11-29 MED ORDER — LIDOCAINE 2% (20 MG/ML) 5 ML SYRINGE
INTRAMUSCULAR | Status: DC | PRN
  Administered 2018-11-29: 20 mg via INTRAVENOUS

## 2018-11-29 MED ORDER — BUPIVACAINE-EPINEPHRINE (PF) 0.5% -1:200000 IJ SOLN
INTRAMUSCULAR | Status: DC | PRN
  Administered 2018-11-29: 25 mL via PERINEURAL

## 2018-11-29 MED ORDER — MIDAZOLAM HCL 2 MG/2ML IJ SOLN
1.0000 mg | INTRAMUSCULAR | Status: DC | PRN
  Administered 2018-11-29: 2 mg via INTRAVENOUS

## 2018-11-29 MED ORDER — DEXAMETHASONE SODIUM PHOSPHATE 10 MG/ML IJ SOLN
INTRAMUSCULAR | Status: DC | PRN
  Administered 2018-11-29: 10 mg via INTRAVENOUS

## 2018-11-29 MED ORDER — FENTANYL CITRATE (PF) 100 MCG/2ML IJ SOLN
INTRAMUSCULAR | Status: DC | PRN
  Administered 2018-11-29 (×2): 25 ug via INTRAVENOUS
  Administered 2018-11-29: 50 ug via INTRAVENOUS

## 2018-11-29 MED ORDER — IBUPROFEN 800 MG PO TABS: 800.0000 mg | ORAL_TABLET | Freq: Three times a day (TID) | ORAL | 0 refills | Status: DC | PRN

## 2018-11-29 MED ORDER — LIDOCAINE 2% (20 MG/ML) 5 ML SYRINGE
INTRAMUSCULAR | Status: AC
  Filled 2018-11-29: qty 5

## 2018-11-29 MED ORDER — DOCUSATE SODIUM 100 MG PO CAPS: 100.0000 mg | ORAL_CAPSULE | Freq: Every day | ORAL | 2 refills | Status: DC | PRN

## 2018-11-29 MED ORDER — HYDROCODONE-ACETAMINOPHEN 10-325 MG PO TABS: 1.0000 | ORAL_TABLET | Freq: Four times a day (QID) | ORAL | 0 refills | Status: AC | PRN

## 2018-11-29 MED ORDER — PROPOFOL 10 MG/ML IV BOLUS
INTRAVENOUS | Status: AC
  Filled 2018-11-29: qty 20

## 2018-11-29 MED ORDER — FENTANYL CITRATE (PF) 100 MCG/2ML IJ SOLN
50.0000 ug | INTRAMUSCULAR | Status: DC | PRN
  Administered 2018-11-29: 50 ug via INTRAVENOUS

## 2018-11-29 MED ORDER — SCOPOLAMINE 1 MG/3DAYS TD PT72: 1.0000 | MEDICATED_PATCH | Freq: Once | TRANSDERMAL | Status: DC | PRN

## 2018-11-29 MED ORDER — HYDROCODONE-ACETAMINOPHEN 10-325 MG PO TABS: 1.0000 | ORAL_TABLET | Freq: Four times a day (QID) | ORAL | 0 refills | Status: DC | PRN

## 2018-11-29 MED ORDER — 0.9 % SODIUM CHLORIDE (POUR BTL) OPTIME
TOPICAL | Status: DC | PRN
  Administered 2018-11-29: 1000 mL

## 2018-11-29 MED ORDER — CLINDAMYCIN PHOSPHATE 900 MG/50ML IV SOLN
900.0000 mg | INTRAVENOUS | Status: AC
  Administered 2018-11-29: 900 mg via INTRAVENOUS

## 2018-11-29 MED ORDER — CHLORHEXIDINE GLUCONATE CLOTH 2 % EX PADS: 6.0000 | MEDICATED_PAD | Freq: Once | CUTANEOUS | Status: DC

## 2018-11-29 MED ORDER — ONDANSETRON HCL 4 MG/2ML IJ SOLN
INTRAMUSCULAR | Status: DC | PRN
  Administered 2018-11-29: 4 mg via INTRAVENOUS

## 2018-11-29 MED ORDER — PROMETHAZINE HCL 25 MG PO TABS: 25.0000 mg | ORAL_TABLET | Freq: Four times a day (QID) | ORAL | 0 refills | Status: DC | PRN

## 2018-11-29 MED FILL — IBUPROFEN 800 MG TAB: 800 | 10 days supply | Qty: 30 | Fill #0

## 2018-11-29 MED FILL — PROMETHAZINE 25 MG TABLET: 25 | 7 days supply | Qty: 30 | Fill #0

## 2018-11-29 MED FILL — HYDROCODON-APAP 10-325: 10-325 | 7 days supply | Qty: 21 | Fill #0

## 2018-11-29 SURGICAL SUPPLY — 49 items
BANDAGE ACE 3X5.8 VEL STRL LF (GAUZE/BANDAGES/DRESSINGS) ×2 IMPLANT
BIT DRILL MEMOFIX 1.7 (BIT) ×2 IMPLANT
BLADE AVERAGE 25X9 (BLADE) ×2 IMPLANT
BLADE SURG 15 STRL LF DISP TIS (BLADE) ×2 IMPLANT
BLADE SURG 15 STRL SS (BLADE) ×2
BNDG ESMARK 4X9 LF (GAUZE/BANDAGES/DRESSINGS) ×2 IMPLANT
BNDG GAUZE ELAST 4 BULKY (GAUZE/BANDAGES/DRESSINGS) ×2 IMPLANT
BOOT STEPPER DURA MED (SOFTGOODS) ×2 IMPLANT
COVER BACK TABLE 60X90IN (DRAPES) ×2 IMPLANT
COVER WAND RF STERILE (DRAPES) IMPLANT
CUFF TOURNIQUET SINGLE 18IN (TOURNIQUET CUFF) ×2 IMPLANT
DRAPE EXTREMITY T 121X128X90 (DRAPE) ×2 IMPLANT
DRAPE IMP U-DRAPE 54X76 (DRAPES) ×2 IMPLANT
DRAPE OEC MINIVIEW 54X84 (DRAPES) ×2 IMPLANT
DRAPE SURG 17X23 STRL (DRAPES) ×2 IMPLANT
DRSG EMULSION OIL 3X3 NADH (GAUZE/BANDAGES/DRESSINGS) ×2 IMPLANT
DRSG PAD ABDOMINAL 8X10 ST (GAUZE/BANDAGES/DRESSINGS) IMPLANT
DURAPREP 26ML APPLICATOR (WOUND CARE) ×2 IMPLANT
ELECT REM PT RETURN 9FT ADLT (ELECTROSURGICAL) ×2
ELECTRODE REM PT RTRN 9FT ADLT (ELECTROSURGICAL) ×1 IMPLANT
GAUZE 4X4 16PLY RFD (DISPOSABLE) ×2 IMPLANT
GAUZE SPONGE 4X4 12PLY STRL (GAUZE/BANDAGES/DRESSINGS) ×2 IMPLANT
GLOVE BIO SURGEON STRL SZ 6.5 (GLOVE) ×2 IMPLANT
GLOVE BIOGEL PI IND STRL 6.5 (GLOVE) ×1 IMPLANT
GLOVE BIOGEL PI INDICATOR 6.5 (GLOVE) ×1
GOWN STRL REUS W/ TWL LRG LVL3 (GOWN DISPOSABLE) ×2 IMPLANT
GOWN STRL REUS W/TWL LRG LVL3 (GOWN DISPOSABLE) ×2
GUIDEWIRE 1.0X70MM (WIRE) ×4 IMPLANT
NEEDLE HYPO 25X1 1.5 SAFETY (NEEDLE) ×2 IMPLANT
NS IRRIG 1000ML POUR BTL (IV SOLUTION) IMPLANT
PACK BASIN DAY SURGERY FS (CUSTOM PROCEDURE TRAY) ×2 IMPLANT
PADDING CAST ABS 4INX4YD NS (CAST SUPPLIES) ×1
PADDING CAST ABS COTTON 4X4 ST (CAST SUPPLIES) ×1 IMPLANT
PENCIL BUTTON HOLSTER BLD 10FT (ELECTRODE) ×2 IMPLANT
SCREW QUIZ 3.0X16 (Screw) ×2 IMPLANT
SLEEVE SCD COMPRESS KNEE MED (MISCELLANEOUS) ×2 IMPLANT
STAPLE MEMOFIX MS 10X10X10 (Staple) ×2 IMPLANT
STOCKINETTE 6  STRL (DRAPES) ×1
STOCKINETTE 6 STRL (DRAPES) ×1 IMPLANT
STOCKINETTE SYNTHETIC 4 NONSTR (MISCELLANEOUS) ×2 IMPLANT
SUT ETHILON 4 0 PS 2 18 (SUTURE) ×2 IMPLANT
SUT MNCRL AB 3-0 PS2 18 (SUTURE) IMPLANT
SUT MNCRL AB 4-0 PS2 18 (SUTURE) IMPLANT
SUT MON AB 5-0 PS2 18 (SUTURE) IMPLANT
SUT VIC AB 3-0 FS2 27 (SUTURE) ×2 IMPLANT
SUT VICRYL 4-0 PS2 18IN ABS (SUTURE) ×2 IMPLANT
SYR BULB 3OZ (MISCELLANEOUS) ×2 IMPLANT
TOWEL GREEN STERILE FF (TOWEL DISPOSABLE) ×2 IMPLANT
UNDERPAD 30X30 (UNDERPADS AND DIAPERS) ×2 IMPLANT

## 2018-11-29 NOTE — Anesthesia Preprocedure Evaluation (Signed)
Anesthesia Evaluation  Patient identified by MRN, date of birth, ID band Patient awake    Reviewed: Allergy & Precautions, NPO status , Patient's Chart, lab work & pertinent test results  Airway Mallampati: II  TM Distance: >3 FB Neck ROM: Full    Dental  (+) Dental Advisory Given   Pulmonary asthma ,    breath sounds clear to auscultation       Cardiovascular hypertension, Pt. on medications  Rhythm:Regular Rate:Normal     Neuro/Psych negative neurological ROS     GI/Hepatic Neg liver ROS, GERD  ,  Endo/Other  negative endocrine ROS  Renal/GU negative Renal ROS     Musculoskeletal   Abdominal   Peds  Hematology negative hematology ROS (+)   Anesthesia Other Findings   Reproductive/Obstetrics                             Anesthesia Physical Anesthesia Plan  ASA: II  Anesthesia Plan: General   Post-op Pain Management:  Regional for Post-op pain   Induction: Intravenous  PONV Risk Score and Plan: 3 and Midazolam, Dexamethasone, Ondansetron and Treatment may vary due to age or medical condition  Airway Management Planned: LMA  Additional Equipment:   Intra-op Plan:   Post-operative Plan: Extubation in OR  Informed Consent: I have reviewed the patients History and Physical, chart, labs and discussed the procedure including the risks, benefits and alternatives for the proposed anesthesia with the patient or authorized representative who has indicated his/her understanding and acceptance.   Dental advisory given  Plan Discussed with: CRNA  Anesthesia Plan Comments:         Anesthesia Quick Evaluation

## 2018-11-29 NOTE — Anesthesia Procedure Notes (Signed)
Anesthesia Regional Block: Popliteal block   Pre-Anesthetic Checklist: ,, timeout performed, Correct Patient, Correct Site, Correct Laterality, Correct Procedure, Correct Position, site marked, Risks and benefits discussed,  Surgical consent,  Pre-op evaluation,  At surgeon's request and post-op pain management  Laterality: Left  Prep: chloraprep       Needles:  Injection technique: Single-shot  Needle Type: Echogenic Needle     Needle Length: 9cm  Needle Gauge: 21     Additional Needles:   Procedures:,,,, ultrasound used (permanent image in chart),,,,  Narrative:  Start time: 11/29/2018 1:45 PM End time: 11/29/2018 1:52 PM Injection made incrementally with aspirations every 5 mL.  Performed by: Personally  Anesthesiologist: Suzette Battiest, MD

## 2018-11-29 NOTE — Progress Notes (Signed)
Assisted Dr. Rob Fitzgerald with left, ultrasound guided, popliteal/saphenous block. Side rails up, monitors on throughout procedure. See vital signs in flow sheet. Tolerated Procedure well. 

## 2018-11-29 NOTE — Discharge Instructions (Signed)
Attached to paper chart  Post Anesthesia Home Care Instructions  Activity: Get plenty of rest for the remainder of the day. A responsible individual must stay with you for 24 hours following the procedure.  For the next 24 hours, DO NOT: -Drive a car -Operate machinery -Drink alcoholic beverages -Take any medication unless instructed by your physician -Make any legal decisions or sign important papers.  Meals: Start with liquid foods such as gelatin or soup. Progress to regular foods as tolerated. Avoid greasy, spicy, heavy foods. If nausea and/or vomiting occur, drink only clear liquids until the nausea and/or vomiting subsides. Call your physician if vomiting continues.  Special Instructions/Symptoms: Your throat may feel dry or sore from the anesthesia or the breathing tube placed in your throat during surgery. If this causes discomfort, gargle with warm salt water. The discomfort should disappear within 24 hours.  If you had a scopolamine patch placed behind your ear for the management of post- operative nausea and/or vomiting:  1. The medication in the patch is effective for 72 hours, after which it should be removed.  Wrap patch in a tissue and discard in the trash. Wash hands thoroughly with soap and water. 2. You may remove the patch earlier than 72 hours if you experience unpleasant side effects which may include dry mouth, dizziness or visual disturbances. 3. Avoid touching the patch. Wash your hands with soap and water after contact with the patch.    Regional Anesthesia Blocks  1. Numbness or the inability to move the "blocked" extremity may last from 3-48 hours after placement. The length of time depends on the medication injected and your individual response to the medication. If the numbness is not going away after 48 hours, call your surgeon.  2. The extremity that is blocked will need to be protected until the numbness is gone and the  Strength has returned. Because you  cannot feel it, you will need to take extra care to avoid injury. Because it may be weak, you may have difficulty moving it or using it. You may not know what position it is in without looking at it while the block is in effect.  3. For blocks in the legs and feet, returning to weight bearing and walking needs to be done carefully. You will need to wait until the numbness is entirely gone and the strength has returned. You should be able to move your leg and foot normally before you try and bear weight or walk. You will need someone to be with you when you first try to ensure you do not fall and possibly risk injury.  4. Bruising and tenderness at the needle site are common side effects and will resolve in a few days.  5. Persistent numbness or new problems with movement should be communicated to the surgeon or the [facility] ([phone])/ [facility] ([phone]). 

## 2018-11-29 NOTE — H&P (Signed)
H&P: Podiatry   Katelyn Wilson 42 y.o. female  patient with a history of painful bunion seen on several occassions in office complaining of pain to left greater than right bunion; Patient has tried multiple conservative treatments and opted for Surgical management. Patient had pre-op physical and clearance for bunionectomy with internal fixation and possible first toe osteotomy left foot completed. Patient met this PM in pre-op holding area; informed consent reviewed and signed. All questions answered. Left surgical site marked.  This am patient denies any other pedal complaints. Denies Nausea/fever/vomiting/chills/night sweats/overnight events. Confirms NPO since midnight.   Patient Active Problem List   Diagnosis Date Noted  . Preoperative clearance 11/22/2018  . B12 deficiency 09/09/2017  . Hyperlipidemia 02/03/2017  . Shortness of breath 12/23/2016  . Type 2 diabetes mellitus without complication, without long-term current use of insulin ([place]) 12/23/2016  . Vitamin D deficiency 12/02/2016  . Cough variant asthma 12/05/2015  . Asthma 11/12/2015  . Menometrorrhagia 08/07/2014  . Morbid obesity ([place]) 10/23/2013  . Fibroid uterus 06/22/2013  . Contraceptive management 11/16/2011  . Anxiety   . High blood pressure 06/18/2011  . Family history of hypertrophic cardiomyopathy 06/18/2011    No current facility-administered medications on file prior to encounter.    Current Outpatient Medications on File Prior to Encounter  Medication Sig Dispense Refill  . cetirizine (ZYRTEC) 10 MG tablet Take 10 mg by mouth as needed.      . cyanocobalamin (,VITAMIN B-12,) 1000 MCG/ML injection inject 1000 mcg weekly x 3,  Then monthly 10 mL 0  . hydrochlorothiazide (HYDRODIURIL) 25 MG tablet Take 1 tablet (25 mg total) by mouth daily. 90 tablet 3  . Vitamin D, Ergocalciferol, (DRISDOL) 50000 units CAPS capsule Take 1 capsule (50,000 Units total) by mouth 2 (two) times a week. (Patient taking  differently: Take 50,000 Units by mouth every 30 (thirty) days. ) 30 capsule 2     Allergies  Allergen Reactions  . Penicillins Rash    Amoxicillin    Social History   Socioeconomic History  . Marital status: Single    Spouse name: Not on file  . Number of children: 0  . Years of education: Not on file  . Highest education level: Not on file  Occupational History  . Occupation: Scientist, research (physical sciences): [employer]  . Occupation: Environmental health practitioner: [employer]  Social Needs  . Financial resource strain: Not on file  . Food insecurity:    Worry: Not on file    Inability: Not on file  . Transportation needs:    Medical: Not on file    Non-medical: Not on file  Tobacco Use  . Smoking status: Never Smoker  . Smokeless tobacco: Never Used  Substance and Sexual Activity  . Alcohol use: Yes    Alcohol/week: 5.0 standard drinks    Types: 5 Standard drinks or equivalent per week    Comment: occasional  . Drug use: No  . Sexual activity: Yes    Birth control/protection: Condom  Lifestyle  . Physical activity:    Days per week: Not on file    Minutes per session: Not on file  . Stress: Not on file  Relationships  . Social connections:    Talks on phone: Not on file    Gets together: Not on file    Attends religious service: Not on file    Active member of club or organization: Not on file  Attends meetings of clubs or organizations: Not on file    Relationship status: Not on file  . Intimate partner violence:    Fear of current or ex partner: Not on file    Emotionally abused: Not on file    Physically abused: Not on file    Forced sexual activity: Not on file  Other Topics Concern  . Not on file  Social History Narrative  . Not on file    Past Surgical History:  Procedure Laterality Date  . none    . WISDOM TOOTH EXTRACTION      Family History  Problem Relation Age of Onset  . Heart disease Paternal Aunt 13       sudden death   . Heart disease Father        sudden cardiac death  . Diabetes Father   . Hypertension Father   . Sleep apnea Father   . Diabetes Other   . Lung cancer Other   . Cancer Other   . Diabetes Mother   . Sleep apnea Mother   . Obesity Mother   . Cancer Maternal Grandmother   . Cancer Maternal Grandfather   . Breast cancer Neg Hx      REVIEW OF SYSTEMS: Neurologic: Denies vertigo, syncope, convulsions or  headaches. Musculoskeletal: No muscle or joint pain. Cardiorespiratory:  Denies shortness of breath, dyspnea on exertion, chest pain, cough or  hemoptysis. Gastrointestinal: Denies loose stool, Denies emesis, melena, constipationor rectal  bleeding. Genitourinary: No difficulty with voiding noted.  PHYSICAL EXAMINATION:  Today's Vitals   11/29/18 1405 11/29/18 1410 11/29/18 1415 11/29/18 1420  BP:   (!) 131/93   Pulse: 93 (!) 104 88 84  Resp: (!) 22 18 18 14   Temp:      TempSrc:      SpO2: 100% 100% 100% 100%  Weight:      Height:      PainSc:        GENERAL: Well-developed, well-nourished, in no acute distress. Alert  and cooperative.   LOWER EXTREMITY EXAM: DERMATOLOGY: Skin warm and supple bilateral, no open lesions, nails within normal limits. VASCULAR: Dorsalis Pedis 2/4 and Posterior Tibial 2/4 pedal pulses bilateral, Temperature gradient within normal limits, Capillary fill time 3 seconds, positive pedal hair growth present bilateral. NEUROLOGY: Gross sensation present with light touch bilateral MUSCULOSKELETAL: +foot deformity/ pain with palpation to area of concernleft bunion  XRAY  left foot reviewed from office consistent with significant bunion deformity   ASSESSMENTS:  1. Bunion deformity left foot 2.  Hallux interphalangeus left foot 3.  Left foot pain  PLAN OF CARE: Patient seen and evaluated 1. History and physical completed 2. Patient NPO since midnight 3. Previous Imaging reviewed  4. Consent for surgery explained  and obtained for bunionectomy left foot with internal fixation and possible first toe osteotomy; risk and benefits explained; all questions answered and no guarantees granted. 5. Patient to undergo above surgical procedure 6. Case discussed with patient and to meet with father and boyfriend represented after the procedure 7. To resume all home meds post-procedure and to give prn pain meds, anti-nausea, and anti-constipation medications post op 8. Will continue to follow closely/ see post-operative in the office within 1 week.  Katelyn Wilson, DPM

## 2018-11-29 NOTE — Transfer of Care (Signed)
Immediate Anesthesia Transfer of Care Note  Patient: Katelyn Wilson  Procedure(s) Performed: Isa Rankin LEFT FOOT (Left Foot) AIKEN OSTEOTOMY (Left Foot)  Patient Location: PACU  Anesthesia Type:General  Level of Consciousness: drowsy  Airway & Oxygen Therapy: Patient Spontanous Breathing and Patient connected to face mask oxygen  Post-op Assessment: Report given to RN and Post -op Vital signs reviewed and stable  Post vital signs: Reviewed and stable  Last Vitals:  Vitals Value Taken Time  BP 90/65 11/29/2018  4:15 PM  Temp    Pulse 85 11/29/2018  4:15 PM  Resp 19 11/29/2018  4:15 PM  SpO2 100 % 11/29/2018  4:15 PM  Vitals shown include unvalidated device data.  Last Pain:  Vitals:   11/29/18 1208  TempSrc: Oral  PainSc: 0-No pain         Complications: No apparent anesthesia complications

## 2018-11-29 NOTE — Brief Op Note (Signed)
11/29/2018  4:12 PM  PATIENT:  Katelyn Wilson  42 y.o. female  PRE-OPERATIVE DIAGNOSIS:  HALLUX ABDUCTO VALGUS, HALLUX INTERPHANGEOUS LEFT FOOT  POST-OPERATIVE DIAGNOSIS:  HALLUX ABDUCTO VALGUS, HALLUX INTERPHANGEOUS   PROCEDURE:  Procedure(s): KALISH BUNIONECTOMY LEFT FOOT (Left) AIKEN OSTEOTOMY (Left)  SURGEON:  Surgeon(s) and Role:    * Landis Martins, DPM - Primary  PHYSICIAN ASSISTANT:   ASSISTANTS: none   ANESTHESIA:   regional  EBL:  5 mL   BLOOD ADMINISTERED:none  DRAINS: none   LOCAL MEDICATIONS USED:  NONE  SPECIMEN:  No Specimen  DISPOSITION OF SPECIMEN:  N/A  COUNTS:  YES  TOURNIQUET:   Total Tourniquet Time Documented: Calf (Left) - 76 minutes Total: Calf (Left) - 76 minutes   DICTATION: .Note written in EPIC  PLAN OF CARE: Discharge to home after PACU  PATIENT DISPOSITION:  PACU - hemodynamically stable.   Delay start of Pharmacological VTE agent (>24hrs) due to surgical blood loss or risk of bleeding: no

## 2018-11-29 NOTE — Progress Notes (Signed)
Post op meds changed to [facility] since [facility] was closed -Dr. Cannon Kettle

## 2018-11-29 NOTE — Op Note (Signed)
DATE OF SURGERY: 11-29-18  PREOPERATIVE DIAGNOSES: 1. Hallux valgus, Left foot. 2. Hallux interphalangeus, left foot.  POSTOPERATIVE DIAGNOSES: 1. Hallux valgus, left foot. 2. Hallux interphalangeus, left foot.  PROCEDURES PERFORMED: 1.  Kalish bunionectomy withl first metatarsal osteotomy and internal screw fixation, left foot. 2. Akin bunionectomy, left 1st toe with internal staple fixation.  SURGEON: Landis Martins, DPM   ASSISTANT: None  ESTIMATED BLOOD LOSS:  Minimal.   HEMOSTASIS:  Left ankle tourniquet    ANESTHESIA: TIVA/Block  INDICATIONS FOR PROCEDURE: This female patient presented with bunion pain. She has tried conservative methods such as wide shoes, accommodative padding on an outpatient basis with Dr.  Cannon Kettle all of which have provided inadequate relief. At this time, she desires attempted surgical correction. The risks versus benefits of the procedure have been discussed with the patient in detail by Dr. Cannon Kettle  and the consent is available on the chart for review.  PROCEDURE IN DETAIL: After Regional and IV was established by the Department of Anesthesia, the patient was taken to the operating room via cart and placed on the operative table in supine position and a safety strap was placed across her waist for her protection. Copious amounts of Webril were applied about the left ankle and a pneumatic ankle tourniquet was placed over the Webril.  After adequate IV sedation was administered by the Department of Anesthesia, The foot was elevated off the table. Esmarch bandages were used to exsanguinate the left foot. The pneumatic ankle tourniquet was elevated to 250 mmHg. The foot was lowered in the operative field and the sterile stockinet was reflected. A sterile Betadine was wiped away with a wet and dry sponge and one-two pickup was used to test anesthesia, which was found to be adequate. Attention was directed to the first metatarsophalangeal joint, which was found to  be contracted, laterally deviated, and had decreased range of motion. A #15 blade was used to make a 4 cm dorsolinear incision. A #15 blade was used to deepen the incision through the subcutaneous layer. All superficial subcutaneous vessels were ligated with electrocautery. Next, a linear capsular incision was made down the bone with a #15 blade. The capsule was elevated medially and laterally off the metatarsal head and the metatarsal head was delivered into the wound. A hypertrophic medial eminence was resected with a sagittal saw taking care not to strike the head. The medial plantar aspect of the metatarsal head had some erosive changes and eburnation. Standard lateral release was also performed as well as a lateral capsulotomy freeing the fibular sesamoid complex.  Attention was directed to 1st metatarsal head and shaft after dissection where a V shaped osteotomy with the long arm dorsally was made using a sagittal saw.  The head of the first metatarsal was translocated laterally and reduced and fixated utilizing a 3.0 x 16 mm length headless screw.  The overhanging bone shelf at the medial aspect was then rasped smooth then intraoperative fluoroscopy was obtained.  Nice correction was achieved and excellent bone to bone contact was achieved. Next, a sagittal saw was used to make a proximal cut in approximately 1 cm dorsal to the base of the proximal phalanx, leaving a lateral intact cortical hinge. A distal cut parallel with the nail base was performed and a standard proximal Akin osteotomy was done.  After the wedge bone was removed, the saw blade was reinserted and used to tether the osteotomy with counter-pressure used to close down the osteotomy. A #15 drill blade was used  to drill two converging holes on the medial aspect of the bone. A Integra staple 10 x 10 x 10 was inserted with compression of the osteotomy site. The foot was loaded again and the toe had an excellent cosmetic straight appearance  and the range of motion of the first metatarsophalangeal joint was then improved. Next, reciprocating rasps were used to smooth all bony surfaces. Copious amounts of sterile saline was used to flush the joint. Next, a # 3-0 Vicryl was used to reapproximate the capsular periosteal tissue layer. Next, #4-0 Vicryl was used to close the subcutaneous layer. #5-0 Monocryl was used to the close the skin in a simple running cuticular suture fashion.  The Steri-Strips were applied followed by standard postoperative dressing consisting of 4 x 4s, Kerlix, and Ace. The pneumatic ankle tourniquet was released and immediate hyperemic flush was noted to the digits.   The patient tolerated the above anesthesia and procedure without complications. She was transported via cart to the Postanesthesia Care Unit with vital signs stable and vascular status intact to the left foot. She is to be non weightbearing with crutches or rolling knee scooter. She was given emergency contact numbers and instructions to call if problems arise. She was given prescriptions. She was discharged in stable condition. She is to follow up with Dr. Cannon Kettle in office in 1 week.   Landis Martins, DPM

## 2018-11-29 NOTE — Anesthesia Procedure Notes (Signed)
Procedure Name: LMA Insertion Date/Time: 11/29/2018 2:31 PM Performed by: Gwyndolyn Saxon, CRNA Pre-anesthesia Checklist: Patient identified, Emergency Drugs available, Suction available and Patient being monitored Patient Re-evaluated:Patient Re-evaluated prior to induction Oxygen Delivery Method: Circle system utilized Preoxygenation: Pre-oxygenation with 100% oxygen Induction Type: IV induction Ventilation: Mask ventilation without difficulty LMA: LMA inserted LMA Size: 4.0 Number of attempts: 1 Airway Equipment and Method: Patient positioned with wedge pillow Placement Confirmation: positive ETCO2 and breath sounds checked- equal and bilateral Tube secured with: Tape Dental Injury: Teeth and Oropharynx as per pre-operative assessment

## 2018-11-29 NOTE — Anesthesia Postprocedure Evaluation (Signed)
Anesthesia Post Note  Patient: Katelyn Wilson  Procedure(s) Performed: Isa Rankin LEFT FOOT (Left Foot) AIKEN OSTEOTOMY (Left Foot)     Patient location during evaluation: PACU Anesthesia Type: General Level of consciousness: awake and alert Pain management: pain level controlled Vital Signs Assessment: post-procedure vital signs reviewed and stable Respiratory status: spontaneous breathing, nonlabored ventilation, respiratory function stable and patient connected to nasal cannula oxygen Cardiovascular status: blood pressure returned to baseline and stable Postop Assessment: no apparent nausea or vomiting Anesthetic complications: no    Last Vitals:  Vitals:   11/29/18 1645 11/29/18 1701  BP: 107/66 99/69  Pulse: 82 84  Resp: 17 17  Temp:    SpO2: 100% 100%    Last Pain:  Vitals:   11/29/18 1610  TempSrc:   PainSc: Tyler Deis

## 2018-11-29 NOTE — Anesthesia Procedure Notes (Signed)
Anesthesia Regional Block: Adductor canal block   Pre-Anesthetic Checklist: ,, timeout performed, Correct Patient, Correct Site, Correct Laterality, Correct Procedure, Correct Position, site marked, Risks and benefits discussed,  Surgical consent,  Pre-op evaluation,  At surgeon's request and post-op pain management  Laterality: Left  Prep: chloraprep       Needles:  Injection technique: Single-shot  Needle Type: Echogenic Needle     Needle Length: 9cm  Needle Gauge: 21     Additional Needles:   Procedures:,,,, ultrasound used (permanent image in chart),,,,  Narrative:  Start time: 11/29/2018 1:53 PM End time: 11/29/2018 1:59 PM Injection made incrementally with aspirations every 5 mL.  Performed by: Personally  Anesthesiologist: Suzette Battiest, MD

## 2018-11-29 NOTE — OR Nursing (Signed)
Sterile Integra trays,Integra Quix screws X 2, and Integra Staple instruments opened on the sterile field.

## 2018-12-06 ENCOUNTER — Encounter (HOSPITAL_BASED_OUTPATIENT_CLINIC_OR_DEPARTMENT_OTHER): Payer: Self-pay | Admitting: Sports Medicine

## 2018-12-06 ENCOUNTER — Encounter: Payer: Self-pay | Admitting: *Deleted

## 2018-12-06 ENCOUNTER — Encounter: Payer: Self-pay | Admitting: Sports Medicine

## 2018-12-07 ENCOUNTER — Ambulatory Visit (INDEPENDENT_AMBULATORY_CARE_PROVIDER_SITE_OTHER): Payer: 59

## 2018-12-07 ENCOUNTER — Encounter: Payer: Self-pay | Admitting: Podiatry

## 2018-12-07 ENCOUNTER — Ambulatory Visit (INDEPENDENT_AMBULATORY_CARE_PROVIDER_SITE_OTHER): Payer: Self-pay | Admitting: Podiatry

## 2018-12-07 DIAGNOSIS — M21619 Bunion of unspecified foot: Secondary | ICD-10-CM

## 2018-12-07 DIAGNOSIS — Z9889 Other specified postprocedural states: Secondary | ICD-10-CM

## 2018-12-08 NOTE — Progress Notes (Addendum)
This patient presents to the office for an evaluation of her left foot performed by Dr. Cannon Kettle.  Surgery was performed 1 week ago by Dr. Cannon Kettle.  The procedure was a Scientist, clinical (histocompatibility and immunogenetics) as well as an Akin osteotomy left hallux. . She had a talus bunionectomy with screw fixation as well as an Akin osteotomy with staple fixation left foot.  She states she is doing fine and not having much pain or discomfort.  She says she has stopped taking her pain meds.  She presents the office today wearing the bandage that was applied at the surgical center as well as using her scooter.  This patient presents the office today for her first postoperative visit.  Objective neurovascular status intact.  No pain noted on the calf on the left leg.  Good wound coapted and sutures are intact at the surgical sites.  There is no evidence of any redness swelling or infection noted at this surgical site.  Patient does have normal range of motion with no limitation of motion 1st MPJ left.  Patient does have swelling noted at the level of the first MPJ left foot.    S/P foot surgery  Treatment first postoperative visit.  X-rays taken revealing that the joint is in good alignment and the hardware utilized in the surgery is intact.  Discussed this condition with this patient.  Told her to continue to wear her bandage and keep it dry and clean.  She is also to continue to ambulate with her scooter.  She is to return to the office in 1 week for postoperative examination by Dr. Cannon Kettle.  Patient is to call the office any problems occur. Follow up xrays should be considered her next visit.  Gardiner Barefoot DPM

## 2018-12-14 ENCOUNTER — Encounter: Payer: Self-pay | Admitting: Sports Medicine

## 2018-12-14 ENCOUNTER — Ambulatory Visit (INDEPENDENT_AMBULATORY_CARE_PROVIDER_SITE_OTHER): Payer: 59 | Admitting: Sports Medicine

## 2018-12-14 DIAGNOSIS — M21619 Bunion of unspecified foot: Secondary | ICD-10-CM

## 2018-12-14 DIAGNOSIS — Z9889 Other specified postprocedural states: Secondary | ICD-10-CM

## 2018-12-14 DIAGNOSIS — M79672 Pain in left foot: Secondary | ICD-10-CM

## 2018-12-14 NOTE — Progress Notes (Signed)
Subjective: Katelyn Wilson is a 43 y.o. female patient seen today in office for POV #2 (DOS 11/29/2018), S/P left Harish and Akin bunionectomy. Patient denies pain at surgical site, denies calf pain, denies headache, chest pain, shortness of breath, nausea, vomiting, fever, or chills. Patient states that she is doing well and has not taken any pain medicine.  Patient reports that she wants to see if she can go back to work on next Wednesday or Thursday since she is feeling good. No other issues noted.   Patient Active Problem List   Diagnosis Date Noted  . Preoperative clearance 11/22/2018  . B12 deficiency 09/09/2017  . Hyperlipidemia 02/03/2017  . Shortness of breath 12/23/2016  . Type 2 diabetes mellitus without complication, without long-term current use of insulin ([place]) 12/23/2016  . Vitamin D deficiency 12/02/2016  . Cough variant asthma 12/05/2015  . Asthma 11/12/2015  . Menometrorrhagia 08/07/2014  . Morbid obesity ([place]) 10/23/2013  . Fibroid uterus 06/22/2013  . Contraceptive management 11/16/2011  . Anxiety   . High blood pressure 06/18/2011  . Family history of hypertrophic cardiomyopathy 06/18/2011    Current Outpatient Medications on File Prior to Visit  Medication Sig Dispense Refill  . cetirizine (ZYRTEC) 10 MG tablet Take 10 mg by mouth as needed.      . cyanocobalamin (,VITAMIN B-12,) 1000 MCG/ML injection inject 1000 mcg weekly x 3,  Then monthly 10 mL 0  . docusate sodium (COLACE) 100 MG capsule Take 1 capsule (100 mg total) by mouth daily as needed. 30 capsule 2  . hydrochlorothiazide (HYDRODIURIL) 25 MG tablet Take 1 tablet (25 mg total) by mouth daily. 90 tablet 3  . ibuprofen (ADVIL,MOTRIN) 800 MG tablet Take 1 tablet (800 mg total) by mouth every 8 (eight) hours as needed. 30 tablet 0  . promethazine (PHENERGAN) 25 MG tablet Take 1 tablet (25 mg total) by mouth every 6 (six) hours as needed for nausea or vomiting. 30 tablet 0  . Vitamin D, Ergocalciferol,  (DRISDOL) 50000 units CAPS capsule Take 1 capsule (50,000 Units total) by mouth 2 (two) times a week. (Patient taking differently: Take 50,000 Units by mouth every 30 (thirty) days. ) 30 capsule 2   Current Facility-Administered Medications on File Prior to Visit  Medication Dose Route Frequency Provider Last Rate Last Dose  . cyanocobalamin ((VITAMIN B-12)) injection 1,000 mcg  1,000 mcg Intramuscular Once Shella Maxim, NP        Allergies  Allergen Reactions  . Penicillins Rash    Amoxicillin    Objective: There were no vitals filed for this visit.  General: No acute distress, AAOx3  Left foot: Sutures intact with no gapping or dehiscence at surgical site, mild swelling to left first metatarsophalangeal joint, no erythema, no warmth, no drainage, no signs of infection noted, Capillary fill time <3 seconds in all digits, gross sensation present via light touch to left foot. No pain or crepitation with range of motion left foot.  No pain with calf compression.   Assessment and Plan:  Problem List Items Addressed This Visit    None    Visit Diagnoses    Bunion    -  Primary   Post-operative state       Foot pain, left           -Patient seen and evaluated -Applied dry sterile dressing to surgical site left foot secured with ACE wrap and stockinet  -Advised patient to make sure to keep dressings clean, dry, and intact  to left surgical site, removing the ACE as needed  -Advised patient to continue with cam boot and nonweightbearing will decide at next visit if we can allow her to partial weight-bear and will remove sutures this time she was a little swollen so advised her to allow [place] to keep the sutures in 1 more week -Advised patient to limit activity to necessity  -Advised patient to ice and elevate as necessary  -Will plan for suture removal and determine return to work and allow partial weightbearing less than 10 steps at next office visit. In the meantime, patient to call  office if any issues or problems arise.   Landis Martins, DPM

## 2018-12-15 ENCOUNTER — Encounter: Payer: Self-pay | Admitting: Sports Medicine

## 2018-12-16 ENCOUNTER — Encounter: Payer: Self-pay | Admitting: Sports Medicine

## 2018-12-21 ENCOUNTER — Encounter: Payer: Self-pay | Admitting: Sports Medicine

## 2018-12-21 ENCOUNTER — Ambulatory Visit (INDEPENDENT_AMBULATORY_CARE_PROVIDER_SITE_OTHER): Payer: 59

## 2018-12-21 ENCOUNTER — Ambulatory Visit (INDEPENDENT_AMBULATORY_CARE_PROVIDER_SITE_OTHER): Payer: Self-pay | Admitting: Sports Medicine

## 2018-12-21 DIAGNOSIS — M21612 Bunion of left foot: Secondary | ICD-10-CM

## 2018-12-21 DIAGNOSIS — M21619 Bunion of unspecified foot: Secondary | ICD-10-CM

## 2018-12-21 DIAGNOSIS — Z9889 Other specified postprocedural states: Secondary | ICD-10-CM

## 2018-12-21 NOTE — Progress Notes (Signed)
Subjective: Katelyn Wilson is a 43 y.o. female patient seen today in office for POV #3 (DOS 11/29/2018), S/P left Jeralene Huff and Akin bunionectomy. Patient denies pain at surgical site, denies calf pain, denies headache, chest pain, shortness of breath, nausea, vomiting, fever, or chills. Patient states that she is doing well and has not taken any pain medicine.  Patient reports that she wants to see if she can go back to work on Thursday since she is feeling good, no complaints like last time. No other issues noted.   Patient Active Problem List   Diagnosis Date Noted  . Preoperative clearance 11/22/2018  . B12 deficiency 09/09/2017  . Hyperlipidemia 02/03/2017  . Shortness of breath 12/23/2016  . Type 2 diabetes mellitus without complication, without long-term current use of insulin ([place]) 12/23/2016  . Vitamin D deficiency 12/02/2016  . Cough variant asthma 12/05/2015  . Asthma 11/12/2015  . Menometrorrhagia 08/07/2014  . Morbid obesity ([place]) 10/23/2013  . Fibroid uterus 06/22/2013  . Contraceptive management 11/16/2011  . Anxiety   . High blood pressure 06/18/2011  . Family history of hypertrophic cardiomyopathy 06/18/2011    Current Outpatient Medications on File Prior to Visit  Medication Sig Dispense Refill  . cetirizine (ZYRTEC) 10 MG tablet Take 10 mg by mouth as needed.      . cyanocobalamin (,VITAMIN B-12,) 1000 MCG/ML injection inject 1000 mcg weekly x 3,  Then monthly 10 mL 0  . docusate sodium (COLACE) 100 MG capsule Take 1 capsule (100 mg total) by mouth daily as needed. 30 capsule 2  . hydrochlorothiazide (HYDRODIURIL) 25 MG tablet Take 1 tablet (25 mg total) by mouth daily. 90 tablet 3  . ibuprofen (ADVIL,MOTRIN) 800 MG tablet Take 1 tablet (800 mg total) by mouth every 8 (eight) hours as needed. 30 tablet 0  . promethazine (PHENERGAN) 25 MG tablet Take 1 tablet (25 mg total) by mouth every 6 (six) hours as needed for nausea or vomiting. 30 tablet 0  . Vitamin D,  Ergocalciferol, (DRISDOL) 50000 units CAPS capsule Take 1 capsule (50,000 Units total) by mouth 2 (two) times a week. (Patient taking differently: Take 50,000 Units by mouth every 30 (thirty) days. ) 30 capsule 2   Current Facility-Administered Medications on File Prior to Visit  Medication Dose Route Frequency Provider Last Rate Last Dose  . cyanocobalamin ((VITAMIN B-12)) injection 1,000 mcg  1,000 mcg Intramuscular Once Shella Maxim, NP        Allergies  Allergen Reactions  . Penicillins Rash    Amoxicillin    Objective: There were no vitals filed for this visit.  General: No acute distress, AAOx3  Left foot: Sutures intact with no gapping or dehiscence at surgical site, mild swelling to left first metatarsophalangeal joint, no erythema, no warmth, no drainage, no signs of infection noted, Capillary fill time <3 seconds in all digits, gross sensation present via light touch to left foot. No pain or crepitation with range of motion left foot.  No pain with calf compression.   Assessment and Plan:  Problem List Items Addressed This Visit    None    Visit Diagnoses    Bunion    -  Primary   Relevant Orders   DG Foot Complete Left (Completed)   Post-operative state       Relevant Orders   DG Foot Complete Left (Completed)       -Patient seen and evaluated -Sutures removed -Applied dry sterile dressing to surgical site left foot secured with  ACE wrap and stockinet until Friday or Saturday may remove for shower -Advised RTW Thursday desk duty  -Advised patient to ice and elevate as necessary  -May weightbear for short distance but NWB for longer distances with scooter  -Will plan for xrays and  full weightbearing with CAM boot and d/c knee scooter next office visit. In the meantime, patient to call office if any issues or problems arise.   Landis Martins, DPM

## 2019-01-04 ENCOUNTER — Encounter: Payer: Self-pay | Admitting: Sports Medicine

## 2019-01-04 ENCOUNTER — Ambulatory Visit (INDEPENDENT_AMBULATORY_CARE_PROVIDER_SITE_OTHER): Payer: 59 | Admitting: Sports Medicine

## 2019-01-04 DIAGNOSIS — M21612 Bunion of left foot: Secondary | ICD-10-CM | POA: Diagnosis not present

## 2019-01-04 DIAGNOSIS — M79672 Pain in left foot: Secondary | ICD-10-CM

## 2019-01-04 DIAGNOSIS — M21619 Bunion of unspecified foot: Secondary | ICD-10-CM

## 2019-01-04 DIAGNOSIS — Z9889 Other specified postprocedural states: Secondary | ICD-10-CM

## 2019-01-04 NOTE — Progress Notes (Signed)
Subjective: Katelyn Wilson is a 43 y.o. female patient seen today in office for POV #4 (DOS 11/29/2018), S/P left Jeralene Huff and Akin bunionectomy. Patient denies pain at surgical site, denies calf pain, denies headache, chest pain, shortness of breath, nausea, vomiting, fever, or chills. Patient states that she is doing well and is at work with no problems and transitioned herself from crutches to walking with boot. No other issues noted.   Patient Active Problem List   Diagnosis Date Noted  . Preoperative clearance 11/22/2018  . B12 deficiency 09/09/2017  . Hyperlipidemia 02/03/2017  . Shortness of breath 12/23/2016  . Type 2 diabetes mellitus without complication, without long-term current use of insulin ([place]) 12/23/2016  . Vitamin D deficiency 12/02/2016  . Cough variant asthma 12/05/2015  . Morbid obesity ([place]) 10/23/2013  . Fibroid uterus 06/22/2013  . Contraceptive management 11/16/2011  . High blood pressure 06/18/2011  . Family history of hypertrophic cardiomyopathy 06/18/2011    Current Outpatient Medications on File Prior to Visit  Medication Sig Dispense Refill  . cetirizine (ZYRTEC) 10 MG tablet Take 10 mg by mouth as needed.      . cyanocobalamin (,VITAMIN B-12,) 1000 MCG/ML injection inject 1000 mcg weekly x 3,  Then monthly 10 mL 0  . docusate sodium (COLACE) 100 MG capsule Take 1 capsule (100 mg total) by mouth daily as needed. 30 capsule 2  . hydrochlorothiazide (HYDRODIURIL) 25 MG tablet Take 1 tablet (25 mg total) by mouth daily. 90 tablet 3  . ibuprofen (ADVIL,MOTRIN) 800 MG tablet Take 1 tablet (800 mg total) by mouth every 8 (eight) hours as needed. 30 tablet 0  . promethazine (PHENERGAN) 25 MG tablet Take 1 tablet (25 mg total) by mouth every 6 (six) hours as needed for nausea or vomiting. 30 tablet 0  . Vitamin D, Ergocalciferol, (DRISDOL) 50000 units CAPS capsule Take 1 capsule (50,000 Units total) by mouth 2 (two) times a week. (Patient taking differently: Take 50,000  Units by mouth every 30 (thirty) days. ) 30 capsule 2   Current Facility-Administered Medications on File Prior to Visit  Medication Dose Route Frequency Provider Last Rate Last Dose  . cyanocobalamin ((VITAMIN B-12)) injection 1,000 mcg  1,000 mcg Intramuscular Once Shella Maxim, NP        Allergies  Allergen Reactions  . Penicillins Rash    Amoxicillin    Objective: There were no vitals filed for this visit.  General: No acute distress, AAOx3  Left foot: Incision healing well, no dehiscence at surgical site, mild swelling to left first metatarsophalangeal joint, no erythema, no warmth, no drainage, no signs of infection noted, Capillary fill time <3 seconds in all digits, gross sensation present via light touch to left foot. No pain or crepitation with range of motion left foot.  No pain with calf compression.   Assessment and Plan:  Problem List Items Addressed This Visit    None    Visit Diagnoses    Bunion    -  Primary   Post-operative state       Foot pain, left           -Patient seen and evaluated -Dispensed post op shoe and anklet to use as instructed  -Advised release full duty no restrictions besides use of post op shoe and weightbearing as tolerated  -Advised patient to ice and elevate as necessary  -Will plan for ROM, xrays, and transition to tennis shoe next office visit. In the meantime, patient to call office if  any issues or problems arise.   Landis Martins, DPM

## 2019-01-25 ENCOUNTER — Ambulatory Visit (INDEPENDENT_AMBULATORY_CARE_PROVIDER_SITE_OTHER): Payer: 59 | Admitting: Sports Medicine

## 2019-01-25 ENCOUNTER — Ambulatory Visit (INDEPENDENT_AMBULATORY_CARE_PROVIDER_SITE_OTHER): Payer: 59

## 2019-01-25 ENCOUNTER — Encounter: Payer: Self-pay | Admitting: Sports Medicine

## 2019-01-25 DIAGNOSIS — M79672 Pain in left foot: Secondary | ICD-10-CM

## 2019-01-25 DIAGNOSIS — M21619 Bunion of unspecified foot: Secondary | ICD-10-CM

## 2019-01-25 DIAGNOSIS — M2012 Hallux valgus (acquired), left foot: Secondary | ICD-10-CM

## 2019-01-25 DIAGNOSIS — Z9889 Other specified postprocedural states: Secondary | ICD-10-CM

## 2019-01-25 NOTE — Progress Notes (Signed)
Subjective: Katelyn Wilson is a 43 y.o. female patient seen today in office for POV #5 (DOS 11/29/2018), S/P left Jeralene Huff and Akin bunionectomy. Patient denies pain at surgical site, denies calf pain, denies headache, chest pain, shortness of breath, nausea, vomiting, fever, or chills. Patient states that she had a little swelling at incision and pulled a stitch. No other issues noted.   Patient Active Problem List   Diagnosis Date Noted  . Preoperative clearance 11/22/2018  . B12 deficiency 09/09/2017  . Hyperlipidemia 02/03/2017  . Shortness of breath 12/23/2016  . Type 2 diabetes mellitus without complication, without long-term current use of insulin ([place]) 12/23/2016  . Vitamin D deficiency 12/02/2016  . Cough variant asthma 12/05/2015  . Morbid obesity ([place]) 10/23/2013  . Fibroid uterus 06/22/2013  . Contraceptive management 11/16/2011  . High blood pressure 06/18/2011  . Family history of hypertrophic cardiomyopathy 06/18/2011    Current Outpatient Medications on File Prior to Visit  Medication Sig Dispense Refill  . cetirizine (ZYRTEC) 10 MG tablet Take 10 mg by mouth as needed.      . cyanocobalamin (,VITAMIN B-12,) 1000 MCG/ML injection inject 1000 mcg weekly x 3,  Then monthly 10 mL 0  . docusate sodium (COLACE) 100 MG capsule Take 1 capsule (100 mg total) by mouth daily as needed. 30 capsule 2  . hydrochlorothiazide (HYDRODIURIL) 25 MG tablet Take 1 tablet (25 mg total) by mouth daily. 90 tablet 3  . ibuprofen (ADVIL,MOTRIN) 800 MG tablet Take 1 tablet (800 mg total) by mouth every 8 (eight) hours as needed. 30 tablet 0  . promethazine (PHENERGAN) 25 MG tablet Take 1 tablet (25 mg total) by mouth every 6 (six) hours as needed for nausea or vomiting. 30 tablet 0  . Vitamin D, Ergocalciferol, (DRISDOL) 50000 units CAPS capsule Take 1 capsule (50,000 Units total) by mouth 2 (two) times a week. (Patient taking differently: Take 50,000 Units by mouth every 30 (thirty) days. ) 30 capsule  2   Current Facility-Administered Medications on File Prior to Visit  Medication Dose Route Frequency Provider Last Rate Last Dose  . cyanocobalamin ((VITAMIN B-12)) injection 1,000 mcg  1,000 mcg Intramuscular Once Shella Maxim, NP        Allergies  Allergen Reactions  . Penicillins Rash    Amoxicillin    Objective: There were no vitals filed for this visit.  General: No acute distress, AAOx3  Left foot: Incision well healed, no dehiscence at surgical site, mild swelling to left first metatarsophalangeal joint, no erythema, no warmth, no drainage, no signs of infection noted, Capillary fill time <3 seconds in all digits, gross sensation present via light touch to left foot. No pain or crepitation with range of motion left foot.  No pain with calf compression.   Assessment and Plan:  Problem List Items Addressed This Visit    None    Visit Diagnoses    Hallux valgus of left foot    -  Primary   Relevant Orders   DG Foot Complete Left (Completed)   Bunion       Post-operative state       Foot pain, left           -Patient seen and evaluated -Xrays reviewed consistent with normal post op healing no acute findings  -Advised Scar creams and ROM exercises -May use normal shoes/tennis shoes  -Handicap Sticker renewed  -Will plan for final xray and increasing to normal activities next office visit. In the meantime, patient  to call office if any issues or problems arise.   Landis Martins, DPM

## 2019-01-26 ENCOUNTER — Encounter: Payer: Self-pay | Admitting: Sports Medicine

## 2019-02-07 ENCOUNTER — Telehealth: Payer: 59 | Admitting: Family

## 2019-02-07 DIAGNOSIS — J302 Other seasonal allergic rhinitis: Secondary | ICD-10-CM

## 2019-02-07 MED ORDER — LEVOCETIRIZINE DIHYDROCHLORIDE 5 MG PO TABS: 5.0000 mg | ORAL_TABLET | Freq: Every evening | ORAL | 0 refills | Status: DC

## 2019-02-07 MED ORDER — FLUTICASONE PROPIONATE 50 MCG/ACT NA SUSP: 2.0000 | Freq: Every day | NASAL | 0 refills | Status: DC

## 2019-02-07 MED FILL — LEVOCETIRIZINE 5 MG TABLET: 5 | 30 days supply | Qty: 30 | Fill #0

## 2019-02-07 MED FILL — FLUTICASONE PROP 50 MCG SPR: 50 | 30 days supply | Qty: 16 | Fill #0

## 2019-02-07 NOTE — Progress Notes (Signed)
E visit for Allergic Rhinitis We are sorry that you are not feeling well.  Here is how we plan to help!  Based on what you have shared with me it looks like you have Allergic Rhinitis.  Rhinitis is when a reaction occurs that causes nasal congestion, runny nose, sneezing, and itching.  Most types of rhinitis are caused by an inflammation and are associated with symptoms in the eyes ears or throat. There are several types of rhinitis.  The most common are acute rhinitis, which is usually caused by a viral illness, allergic or seasonal rhinitis, and nonallergic or year-round rhinitis.  Nasal allergies occur certain times of the year.  Allergic rhinitis is caused when allergens in the air trigger the release of histamine in the body.  Histamine causes itching, swelling, and fluid to build up in the fragile linings of the nasal passages, sinuses and eyelids.  An itchy nose and clear discharge are common.  I recommend the following over the counter treatments: Xyzal 5 mg take 1 tablet daily  I also would recommend a nasal spray: Flonase 2 sprays into each nostril once daily  I have sent these medications in to the pharmacy.  HOME CARE:   You can use an over-the-counter saline nasal spray as needed  Avoid areas where there is heavy dust, mites, or molds  Stay indoors on windy days during the pollen season  Keep windows closed in home, at least in bedroom; use air conditioner.  Use high-efficiency house air filter  Keep windows closed in car, turn AC on re-circulate  Avoid playing out with dog during pollen season  GET HELP RIGHT AWAY IF:   If your symptoms do not improve within 10 days  You become short of breath  You develop yellow or green discharge from your nose for over 3 days  You have coughing fits  MAKE SURE YOU:   Understand these instructions  Will watch your condition  Will get help right away if you are not doing well or get worse  Thank you for choosing an  e-visit. Your e-visit answers were reviewed by a board certified advanced clinical practitioner to complete your personal care plan. Depending upon the condition, your plan could have included both over the counter or prescription medications. Please review your pharmacy choice. Be sure that the pharmacy you have chosen is open so that you can pick up your prescription now.  If there is a problem you may message your provider in [place] to have the prescription routed to another pharmacy. Your safety is important to [place]. If you have drug allergies check your prescription carefully.  For the next 24 hours, you can use MyChart to ask questions about today's visit, request a non-urgent call back, or ask for a work or school excuse from your e-visit provider. You will get an email in the next two days asking about your experience. I hope that your e-visit has been valuable and will speed your recovery.

## 2019-02-18 ENCOUNTER — Other Ambulatory Visit: Payer: Self-pay | Admitting: Internal Medicine

## 2019-02-18 DIAGNOSIS — B9689 Other specified bacterial agents as the cause of diseases classified elsewhere: Secondary | ICD-10-CM

## 2019-02-18 DIAGNOSIS — J028 Acute pharyngitis due to other specified organisms: Principal | ICD-10-CM

## 2019-02-22 ENCOUNTER — Ambulatory Visit (INDEPENDENT_AMBULATORY_CARE_PROVIDER_SITE_OTHER): Payer: 59

## 2019-02-22 ENCOUNTER — Other Ambulatory Visit: Payer: Self-pay

## 2019-02-22 ENCOUNTER — Other Ambulatory Visit: Payer: Self-pay | Admitting: Sports Medicine

## 2019-02-22 ENCOUNTER — Ambulatory Visit (INDEPENDENT_AMBULATORY_CARE_PROVIDER_SITE_OTHER): Payer: Self-pay | Admitting: Sports Medicine

## 2019-02-22 DIAGNOSIS — M79672 Pain in left foot: Secondary | ICD-10-CM

## 2019-02-22 DIAGNOSIS — M2012 Hallux valgus (acquired), left foot: Secondary | ICD-10-CM

## 2019-02-22 DIAGNOSIS — Z9889 Other specified postprocedural states: Secondary | ICD-10-CM

## 2019-02-22 DIAGNOSIS — M21619 Bunion of unspecified foot: Secondary | ICD-10-CM

## 2019-02-22 NOTE — Progress Notes (Signed)
Subjective: Katelyn Wilson is a 43 y.o. female patient seen today in office for POV #6 (DOS 11/29/2018), S/P left Jeralene Huff and Akin bunionectomy. Patient denies pain at surgical site, denies calf pain, denies headache, chest pain, shortness of breath, nausea, vomiting, fever, or chills. Patient states that she is doing good with no problems. No other issues noted.   Patient Active Problem List   Diagnosis Date Noted  . Preoperative clearance 11/22/2018  . B12 deficiency 09/09/2017  . Hyperlipidemia 02/03/2017  . Shortness of breath 12/23/2016  . Type 2 diabetes mellitus without complication, without long-term current use of insulin ([place]) 12/23/2016  . Vitamin D deficiency 12/02/2016  . Cough variant asthma 12/05/2015  . Morbid obesity ([place]) 10/23/2013  . Fibroid uterus 06/22/2013  . Contraceptive management 11/16/2011  . High blood pressure 06/18/2011  . Family history of hypertrophic cardiomyopathy 06/18/2011    Current Outpatient Medications on File Prior to Visit  Medication Sig Dispense Refill  . cetirizine (ZYRTEC) 10 MG tablet Take 10 mg by mouth as needed.      . cyanocobalamin (,VITAMIN B-12,) 1000 MCG/ML injection inject 1000 mcg weekly x 3,  Then monthly 10 mL 0  . docusate sodium (COLACE) 100 MG capsule Take 1 capsule (100 mg total) by mouth daily as needed. 30 capsule 2  . fluticasone (FLONASE) 50 MCG/ACT nasal spray Place 2 sprays into both nostrils daily. 16 g 0  . hydrochlorothiazide (HYDRODIURIL) 25 MG tablet Take 1 tablet (25 mg total) by mouth daily. 90 tablet 3  . ibuprofen (ADVIL,MOTRIN) 800 MG tablet Take 1 tablet (800 mg total) by mouth every 8 (eight) hours as needed. 30 tablet 0  . levocetirizine (XYZAL) 5 MG tablet Take 1 tablet (5 mg total) by mouth every evening. 30 tablet 0  . promethazine (PHENERGAN) 25 MG tablet Take 1 tablet (25 mg total) by mouth every 6 (six) hours as needed for nausea or vomiting. 30 tablet 0  . VENTOLIN HFA 108 (90 Base) MCG/ACT inhaler  INHALE 2 PUFFS INTO THE LUNGS EVERY SIX HOURS AS NEEDED FOR WHEEZING OR SHORTNESS OF BREATH 18 g 2  . Vitamin D, Ergocalciferol, (DRISDOL) 50000 units CAPS capsule Take 1 capsule (50,000 Units total) by mouth 2 (two) times a week. (Patient taking differently: Take 50,000 Units by mouth every 30 (thirty) days. ) 30 capsule 2   Current Facility-Administered Medications on File Prior to Visit  Medication Dose Route Frequency Provider Last Rate Last Dose  . cyanocobalamin ((VITAMIN B-12)) injection 1,000 mcg  1,000 mcg Intramuscular Once Shella Maxim, NP        Allergies  Allergen Reactions  . Penicillins Rash    Amoxicillin    Objective: There were no vitals filed for this visit.  General: No acute distress, AAOx3  Left foot: Incision well healed, no dehiscence at surgical site, mild swelling to left first metatarsophalangeal joint, no erythema, no warmth, no drainage, no signs of infection noted, Capillary fill time <3 seconds in all digits, gross sensation present via light touch to left foot. No pain or crepitation with range of motion left foot.  No pain with calf compression.   Xrays osteotomy sites well healed with hardware intact   Assessment and Plan:  Problem List Items Addressed This Visit    None    Visit Diagnoses    Pain in left foot    -  Primary   Relevant Orders   DG Foot Complete Left   Hallux valgus of left foot  Bunion       Post-operative state           -Patient seen and evaluated -Xrays reviewed consistent with normal post op healing no acute findings  -Advised continue with Scar creams and ROM exercises -May try other shoes to tolerance and activities to tolerance  -Return PRN, discharged from post op care. In the meantime, patient to call office if any issues or problems arise.   Landis Martins, DPM

## 2019-03-05 MED FILL — ALBUTEROL SULFATE HFA 108 (: 108 (90 BAS | 25 days supply | Qty: 18 | Fill #0

## 2019-03-05 MED FILL — HYDROCHLOROTHIAZIDE 25 MG T: 25 | 90 days supply | Qty: 90 | Fill #0 | Status: TO

## 2019-03-05 MED FILL — CYANOCOBALAMIN 1,000 MCG/ML: 1000 | 28 days supply | Qty: 1 | Fill #0

## 2019-03-25 MED FILL — ALBUTEROL SULFATE HFA 108 (: 108 (90 BAS | 25 days supply | Qty: 18 | Fill #1 | Status: TO

## 2019-03-28 ENCOUNTER — Other Ambulatory Visit: Payer: Self-pay | Admitting: Internal Medicine

## 2019-03-28 MED FILL — CYANOCOBALAMIN 1,000 MCG/ML: 1000 | 84 days supply | Qty: 3 | Fill #0 | Status: TO

## 2019-03-30 ENCOUNTER — Encounter: Payer: Self-pay | Admitting: Sports Medicine

## 2019-03-30 NOTE — Progress Notes (Signed)
DOS 11-29-2018 Procedure: Bunionectomy with internal fixation at left 1st toe and metatarsal

## 2019-04-01 ENCOUNTER — Ambulatory Visit: Payer: 59 | Admitting: Internal Medicine

## 2019-04-05 ENCOUNTER — Other Ambulatory Visit: Payer: Self-pay

## 2019-04-05 ENCOUNTER — Ambulatory Visit (INDEPENDENT_AMBULATORY_CARE_PROVIDER_SITE_OTHER): Payer: 59 | Admitting: Internal Medicine

## 2019-04-05 DIAGNOSIS — I10 Essential (primary) hypertension: Secondary | ICD-10-CM | POA: Diagnosis not present

## 2019-04-05 DIAGNOSIS — E119 Type 2 diabetes mellitus without complications: Secondary | ICD-10-CM

## 2019-04-05 DIAGNOSIS — E559 Vitamin D deficiency, unspecified: Secondary | ICD-10-CM | POA: Diagnosis not present

## 2019-04-05 NOTE — Progress Notes (Addendum)
Virtual Visit via [email or web address]  This visit type was conducted due to national recommendations for restrictions regarding the COVID-19 pandemic (e.g. social distancing).  This format is felt to be most appropriate for this patient at this time.  All issues noted in this document were discussed and addressed.  No physical exam was performed (except for noted visual exam findings with Video Visits).   I connected with@ on 04/05/19 at  3:30 PM EDT by a video enabled telemedicine application and verified that I am speaking with the correct person using two identifiers. Location patient: home Location provider: work or home office Persons participating in the virtual visit: patient, provider  I discussed the limitations, risks, security and privacy concerns of performing an evaluation and management service by telephone and the availability of in person appointments. I also discussed with the patient that there may be a patient responsible charge related to this service. The patient expressed understanding and agreed to proceed.  Reason for visit: follow up on type 2 DM.  Obesity,  Hyperlipidemia and hypertension   HPI:  Patient is taking her medications as prescribed and notes no adverse effects.  Home BP readings have been done about once per week and are  generally < 130/80 .  She is avoiding added salt in her diet and walking regularly for exercise  .  3 month follow up on diabetes.  Patient has no complaints today.  Patient is following a low glycemic index diet and taking all prescribed medications regularly without side effects.  . Patient is walking daily and intentionally trying to lose weight .  Patient has had an eye exam in the last 12 months and checks feet regularly for signs of infection.  Patient does not walk barefoot outside,  And denies an numbness tingling or burning in feet. Patient is up to date on all recommended vaccinations  The patient has no signs or symptoms of COVID 19 infection  (fever, cough, sore throat  or shortness of breath beyond what is typical for patient).  Patient denies contact with other persons with the above mentioned symptoms or with anyone confirmed to have COVID 19 . She is working from home and buying her [employer]ochem[employer], clinical.  S/p left  bunionectomy dec 2019 pain has resolved.     ROS: See pertinent positives and negatives per HPI.  Past Medical History:  Diagnosis Date  . Acid reflux   . allergic rhinitis   . Anxiety   . Asthma    allergy induced  . Hallux abducto valgus, left   . Hypertension   . Obesity   . Obesity (BMI 30-39.9)   . Pre-diabetes   . Prediabetes   . Vitamin D deficiency     Past Surgical History:  Procedure Laterality Date  . Barbie Banner OSTEOTOMY Left 11/29/2018   Procedure: [employer];  Surgeon: Landis Martins, DPM;  Location: [facility];  Service: Podiatry;  Laterality: Left;  . BUNIONECTOMY Left 11/29/2018   Procedure: Jeralene Huff BUNIONECTOMY LEFT FOOT;  Surgeon: Landis Martins, DPM;  Location: [facility];  Service: Podiatry;  Laterality: Left;  . none    . WISDOM TOOTH EXTRACTION      Family History  Problem Relation Age of Onset  . Heart disease Paternal Aunt 39       sudden death  . Heart disease Father        sudden cardiac death  . Diabetes Father   . Hypertension Father   . Sleep apnea Father   .  Diabetes Other   . Lung cancer Other   . Cancer Other   . Diabetes Mother   . Sleep apnea Mother   . Obesity Mother   . Cancer Maternal Grandmother   . Cancer Maternal Grandfather   . Breast cancer Neg Hx     SOCIAL HX: unchanged    Current Outpatient Medications:  .  cetirizine (ZYRTEC) 10 MG tablet, Take 10 mg by mouth as needed.  , Disp: , Rfl:  .  cyanocobalamin (,VITAMIN B-12,) 1000 MCG/ML injection, USE ONE INJECTION WEEKLY FOR 3 WEEKS, THEN MONTHLY, Disp: 10 mL, Rfl: 0 .  fluticasone (FLONASE) 50 MCG/ACT nasal spray, Place 2 sprays into both nostrils daily.,  Disp: 16 g, Rfl: 0 .  hydrochlorothiazide (HYDRODIURIL) 25 MG tablet, Take 1 tablet (25 mg total) by mouth daily., Disp: 90 tablet, Rfl: 3 .  levocetirizine (XYZAL) 5 MG tablet, Take 1 tablet (5 mg total) by mouth every evening., Disp: 30 tablet, Rfl: 0 .  VENTOLIN HFA 108 (90 Base) MCG/ACT inhaler, INHALE 2 PUFFS INTO THE LUNGS EVERY SIX HOURS AS NEEDED FOR WHEEZING OR SHORTNESS OF BREATH, Disp: 18 g, Rfl: 2 .  Vitamin D, Ergocalciferol, (DRISDOL) 50000 units CAPS capsule, Take 1 capsule (50,000 Units total) by mouth 2 (two) times a week. (Patient taking differently: Take 50,000 Units by mouth every 30 (thirty) days. ), Disp: 30 capsule, Rfl: 2  Current Facility-Administered Medications:  .  cyanocobalamin ((VITAMIN B-12)) injection 1,000 mcg, 1,000 mcg, Intramuscular, Once, Shella Maxim, NP  EXAM:  VITALS per patient if applicable:  GENERAL: alert, oriented, appears well and in no acute distress  HEENT: atraumatic, conjunttiva clear, no obvious abnormalities on inspection of external nose and ears  NECK: normal movements of the head and neck  LUNGS: on inspection no signs of respiratory distress, breathing rate appears normal, no obvious gross SOB, gasping or wheezing  CV: no obvious cyanosis  MS: moves all visible extremities without noticeable abnormality  PSYCH/NEURO: pleasant and cooperative, no obvious depression or anxiety, speech and thought processing grossly intact  ASSESSMENT AND PLAN:  Discussed the following assessment and plan:  Vitamin D deficiency - Plan: VITAMIN D 25 Hydroxy (Vit-D Deficiency, Fractures)  Essential hypertension - Plan: Comprehensive metabolic panel  Type 2 diabetes mellitus without complication, without long-term current use of insulin (HCC) - Plan: Hemoglobin A1c  Morbid obesity (HCC)  High blood pressure Well controlled on current regimen. Renal function is due,  no changes today.  Type 2 diabetes mellitus without complication, without  long-term current use of insulin ([place]) Historically  well-controlled on diet alone  . Patient is reminded to schedule an annual eye exam and foot exam is normal today. Patient has no microalbuminuria. 6 month follow labs ordered   Lab Results  Component Value Date   HGBA1C 6.0 (A) 09/30/2018   Lab Results  Component Value Date   MICROALBUR <0.7 09/30/2018     Morbid obesity ([place]) She has been following a more healthy lifestyle since she started working from home and walking daily.     I discussed the assessment and treatment plan with the patient. The patient was provided an opportunity to ask questions and all were answered. The patient agreed with the plan and demonstrated an understanding of the instructions.   The patient was advised to call back or seek an in-person evaluation if the symptoms worsen or if the condition fails to improve as anticipated.  I provided 25 minutes of non-face-to-face time during this encounter.  Crecencio Mc, MD

## 2019-04-05 NOTE — Patient Instructions (Signed)
PLEASE SCHEDULE A LAB VISIT FOR NON FASTING LABS  CHECK BP ONCE A WEEK FOR 4 WEEKS AND SEND ME THE RESULTS ON MYCHART  YOUR GOAL IS 130/80 OR LESS  YOU CAN USE ZYRTEC,  ALLEGRA AND CLARITIN TWICE DAILY FOR ALLERGIES.  (THEY ARE PERFECTLY SAFE)

## 2019-04-06 NOTE — Assessment & Plan Note (Signed)
Well controlled on current regimen. Renal function is due, no changes today. °

## 2019-04-06 NOTE — Assessment & Plan Note (Signed)
Historically  well-controlled on diet alone  . Patient is reminded to schedule an annual eye exam and foot exam is normal today. Patient has no microalbuminuria. 6 month follow labs ordered   Lab Results  Component Value Date   HGBA1C 6.0 (A) 09/30/2018   Lab Results  Component Value Date   MICROALBUR <0.7 09/30/2018

## 2019-04-06 NOTE — Assessment & Plan Note (Signed)
She has been following a more healthy lifestyle since she started working from home and walking daily.

## 2019-04-16 NOTE — Progress Notes (Signed)
Greater than 5 minutes, yet less than 10 minutes of time have been spent researching, coordinating, and implementing care for this patient today.  Thank you for the details you included in the comment boxes. Those details are very helpful in determining the best course of treatment for you and help us to provide the best care.  

## 2019-05-05 ENCOUNTER — Other Ambulatory Visit: Payer: 59

## 2019-05-06 ENCOUNTER — Other Ambulatory Visit: Payer: Self-pay | Admitting: Internal Medicine

## 2019-05-06 DIAGNOSIS — J028 Acute pharyngitis due to other specified organisms: Secondary | ICD-10-CM

## 2019-05-06 DIAGNOSIS — B9689 Other specified bacterial agents as the cause of diseases classified elsewhere: Secondary | ICD-10-CM

## 2019-05-09 ENCOUNTER — Other Ambulatory Visit: Payer: Self-pay

## 2019-05-09 ENCOUNTER — Other Ambulatory Visit (INDEPENDENT_AMBULATORY_CARE_PROVIDER_SITE_OTHER): Payer: 59

## 2019-05-09 DIAGNOSIS — I10 Essential (primary) hypertension: Secondary | ICD-10-CM | POA: Diagnosis not present

## 2019-05-09 DIAGNOSIS — E119 Type 2 diabetes mellitus without complications: Secondary | ICD-10-CM | POA: Diagnosis not present

## 2019-05-09 DIAGNOSIS — E559 Vitamin D deficiency, unspecified: Secondary | ICD-10-CM | POA: Diagnosis not present

## 2019-05-10 LAB — COMPREHENSIVE METABOLIC PANEL
ALT: 13 U/L (ref 0–35)
AST: 12 U/L (ref 0–37)
Albumin: 3.7 g/dL (ref 3.5–5.2)
Alkaline Phosphatase: 59 U/L (ref 39–117)
BUN: 8 mg/dL (ref 6–23)
CO2: 29 mEq/L (ref 19–32)
Calcium: 9.1 mg/dL (ref 8.4–10.5)
Chloride: 100 mEq/L (ref 96–112)
Creatinine, Ser: 0.71 mg/dL (ref 0.40–1.20)
GFR: 109.46 mL/min (ref >60.00)
Glucose, Bld: 133 mg/dL — ABNORMAL HIGH (ref 70–99)
Potassium: 4.4 mEq/L (ref 3.5–5.1)
Sodium: 135 mEq/L (ref 135–145)
Total Bilirubin: 0.2 mg/dL (ref 0.2–1.2)
Total Protein: 7.1 g/dL (ref 6.0–8.3)

## 2019-05-10 LAB — HEMOGLOBIN A1C: Hgb A1c MFr Bld: 7.1 % — ABNORMAL HIGH (ref 4.6–6.5)

## 2019-05-10 LAB — VITAMIN D 25 HYDROXY (VIT D DEFICIENCY, FRACTURES): VITD: 85.69 ng/mL (ref 30.00–100.00)

## 2019-05-25 ENCOUNTER — Telehealth: Payer: 59 | Admitting: Family

## 2019-05-25 DIAGNOSIS — H60501 Unspecified acute noninfective otitis externa, right ear: Secondary | ICD-10-CM | POA: Diagnosis not present

## 2019-05-25 MED ORDER — NEOMYCIN-POLYMYXIN-HC 3.5-10000-1 OT SOLN: 4.0000 [drp] | Freq: Four times a day (QID) | OTIC | 0 refills | Status: DC

## 2019-05-25 MED ORDER — CIPROFLOXACIN HCL 500 MG PO TABS: 500.0000 mg | ORAL_TABLET | Freq: Two times a day (BID) | ORAL | 0 refills | Status: DC

## 2019-05-25 NOTE — Progress Notes (Signed)
E Visit for Swimmer's Ear  We are sorry that you are not feeling well. Here is how we plan to help!  Based on what you have shared with me it looks like you have swimmers ear. Swimmer's ear is a redness or swelling, irritation, or infection of your outer ear canal.  These symptoms usually occur within a few days of swimming.  Your ear canal is a tube that goes from the opening of the ear to the eardrum.  When water stays in your ear canal, germs can grow.  This is a painful condition that often happens to children and swimmers of all ages.  It is not contagious and oral antibiotics are not required to treat uncomplicated swimmer's ear.  The usual symptoms include: Itching inside the ear, Redness or a sense of swelling in the ear, Pain when the ear is tugged on when pressure is placed on the ear, Pus draining from the infected ear. and I have prescribed: Neomycin 0.35%, polymyxin B 10,000 units/mL, and hydrocortisone 0,5% otic solution 4 drops in affected ears four times a day for 7 days  Based on what you have told me you may have a bacterial infection. In addition to the ear drops I have prescribed an oral antibiotic: and Cipro 500mg , one tablet my mouth twice a day for 10 days.  Approximately 5 minutes was spent documenting and reviewing patient's chart.    In certain cases swimmer's ear may progress to a more serious bacterial infection of the middle or inner ear.  If you have a fever 102 and up and significantly worsening symptoms, this could indicate a more serious infection moving to the middle/inner and needs face to face evaluation in an office by a provider.  Your symptoms should improve over the next 3 days and should resolve in about 7 days.  HOME CARE:   Wash your hands frequently.  Do not place the tip of the bottle on your ear or touch it with your fingers.  You can take Acetominophen 650 mg every 4-6 hours as needed for pain.  If pain is severe or moderate, you can apply a  heating pad (set on low) or hot water bottle (wrapped in a towel) to outer ear for 20 minutes.  This will also increase drainage.  Avoid ear plugs  Do not use Q-tips  After showers, help the water run out by tilting your head to one side.  GET HELP RIGHT AWAY IF:   Fever is over 102.2 degrees.  You develop progressive ear pain or hearing loss.  Ear symptoms persist longer than 3 days after treatment.  MAKE SURE YOU:   Understand these instructions.  Will watch your condition.  Will get help right away if you are not doing well or get worse.  TO PREVENT SWIMMER'S EAR:  Use a bathing cap or custom fitted swim molds to keep your ears dry.  Towel off after swimming to dry your ears.  Tilt your head or pull your earlobes to allow the water to escape your ear canal.  If there is still water in your ears, consider using a hairdryer on the lowest setting.  Thank you for choosing an e-visit. Your e-visit answers were reviewed by a board certified advanced clinical practitioner to complete your personal care plan. Depending upon the condition, your plan could have included both over the counter or prescription medications. Please review your pharmacy choice. Be sure that the pharmacy you have chosen is open so that you  can pick up your prescription now.  If there is a problem you may message your provider in [place] to have the prescription routed to another pharmacy. Your safety is important to [place]. If you have drug allergies check your prescription carefully.  For the next 24 hours, you can use MyChart to ask questions about today's visit, request a non-urgent call back, or ask for a work or school excuse from your e-visit provider. You will get an email in the next two days asking about your experience. I hope that your e-visit has been valuable and will speed your recovery.

## 2019-07-10 IMAGING — US US BREAST*L* LIMITED INC AXILLA
1 series · 12 of 18 positions shown · non-contrast
Comparison: Baseline screening mammogram dated 08/10/2018.

CLINICAL DATA: Patient returns today to evaluate a possible LEFT
breast masses identified on recent baseline screening mammogram.

EXAM:
DIGITAL DIAGNOSTIC LEFT MAMMOGRAM WITH CAD AND TOMO
ULTRASOUND LEFT BREAST

[Series 1: us breast*left* limited inc axilla · 0.05mm/px · 12 of 18 slices shown]
[im 1/18]
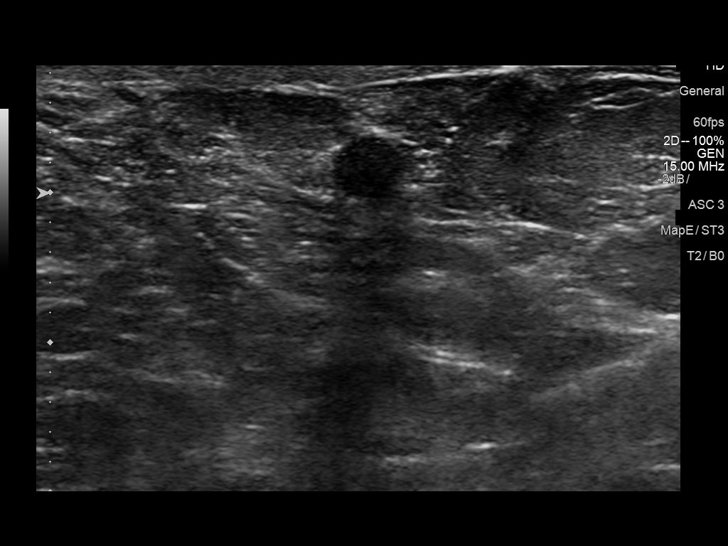
[im 3/18]
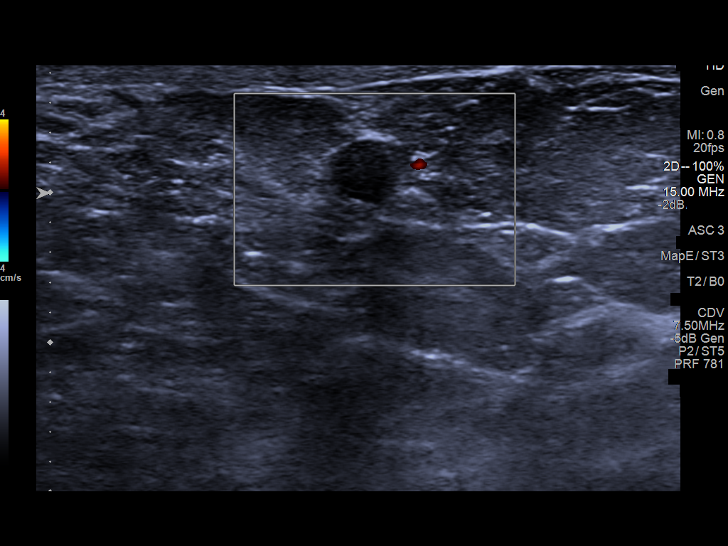
[im 4/18]
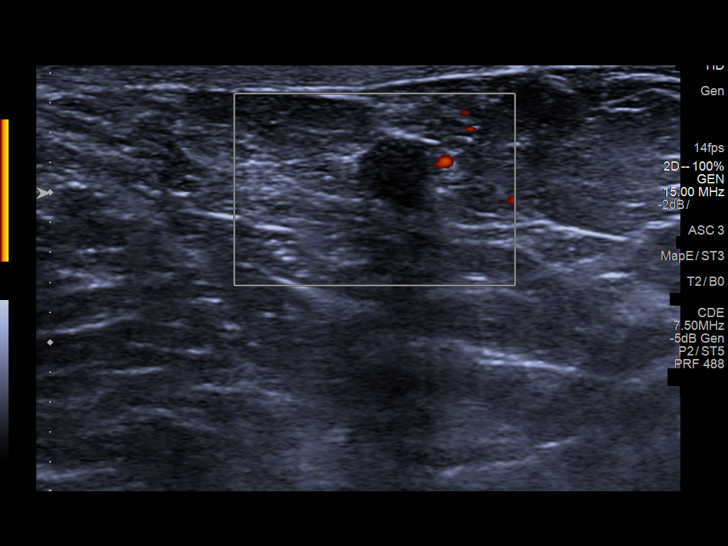
[im 6/18]
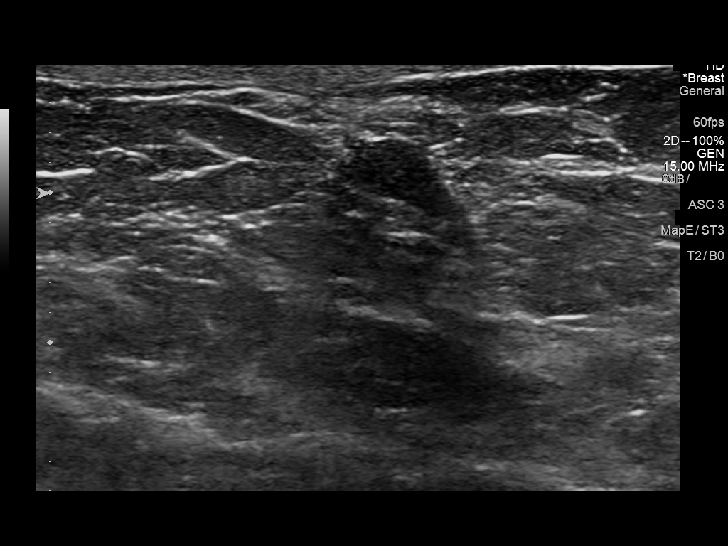
[im 7/18]
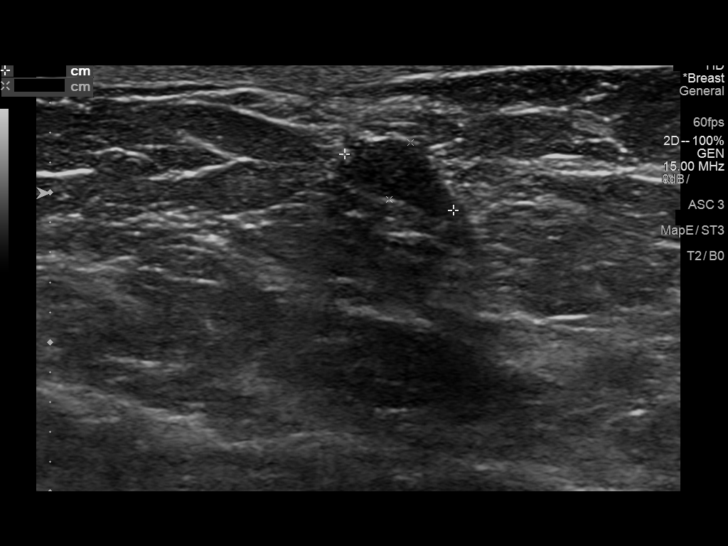
[im 9/18]
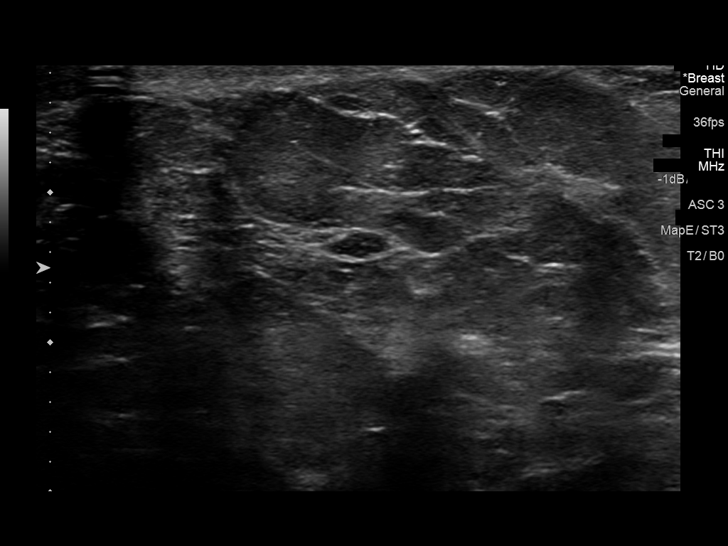
[im 10/18]
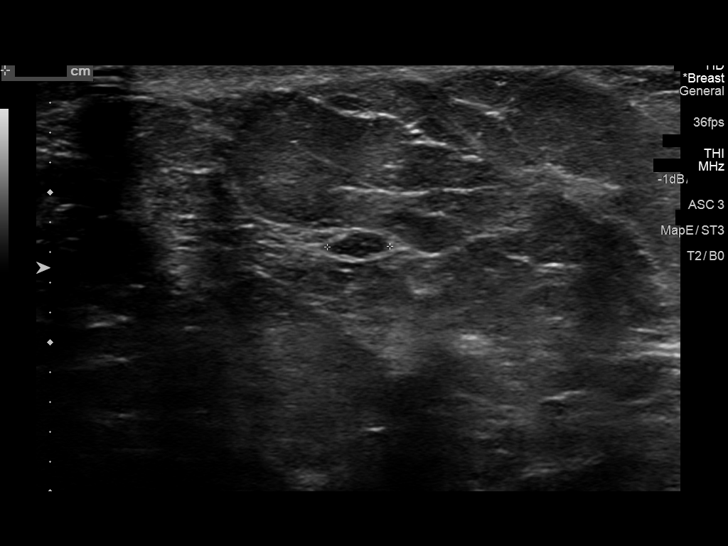
[im 12/18]
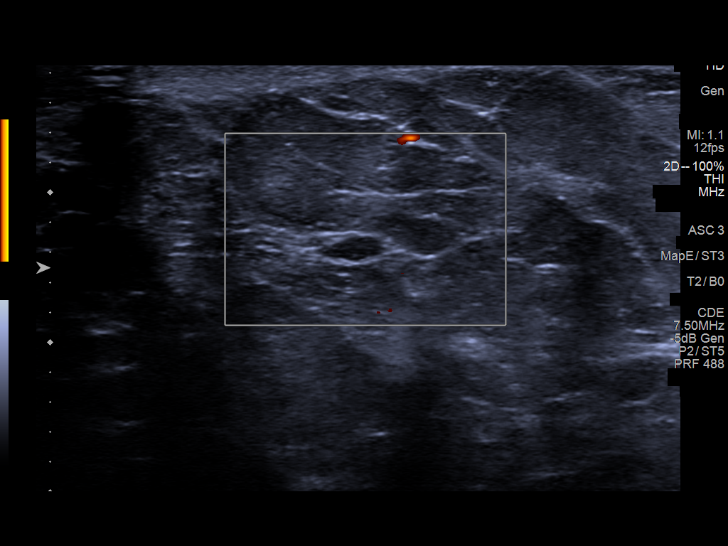
[im 13/18]
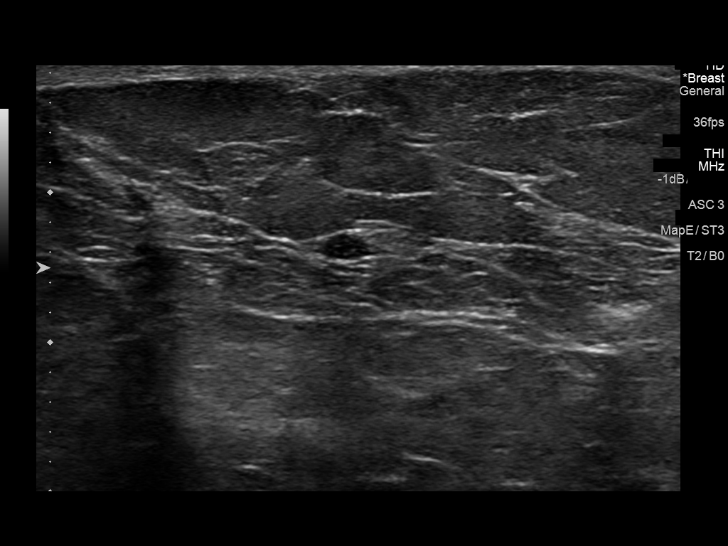
[im 15/18]
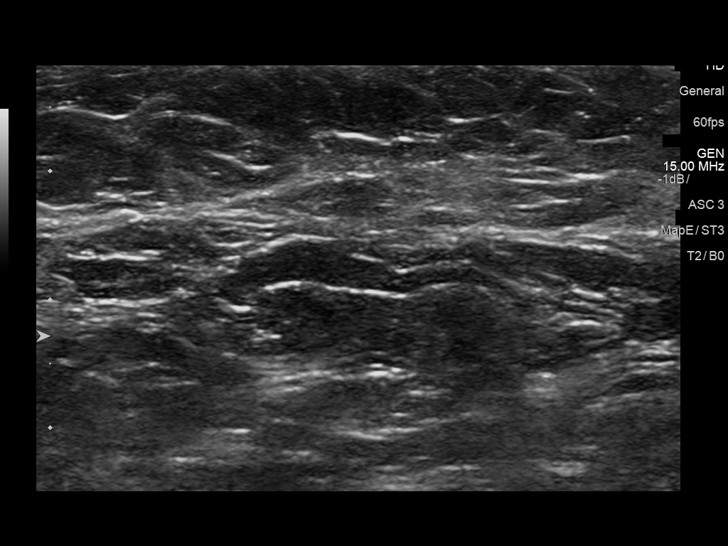
[im 16/18]
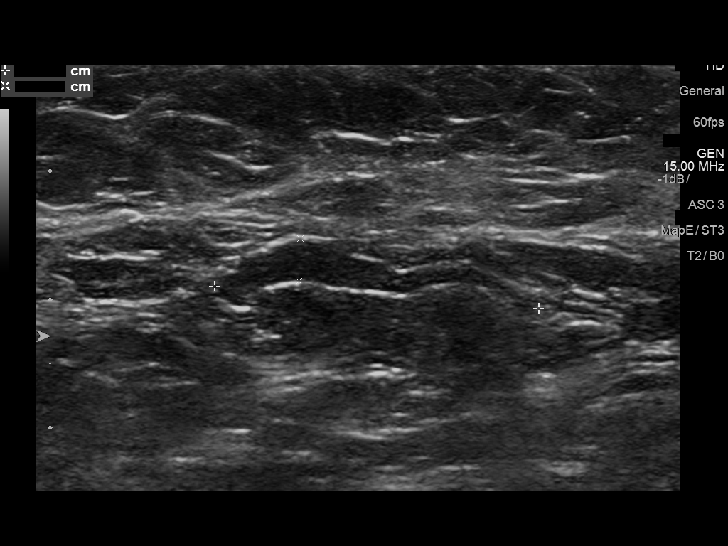
[im 18/18]
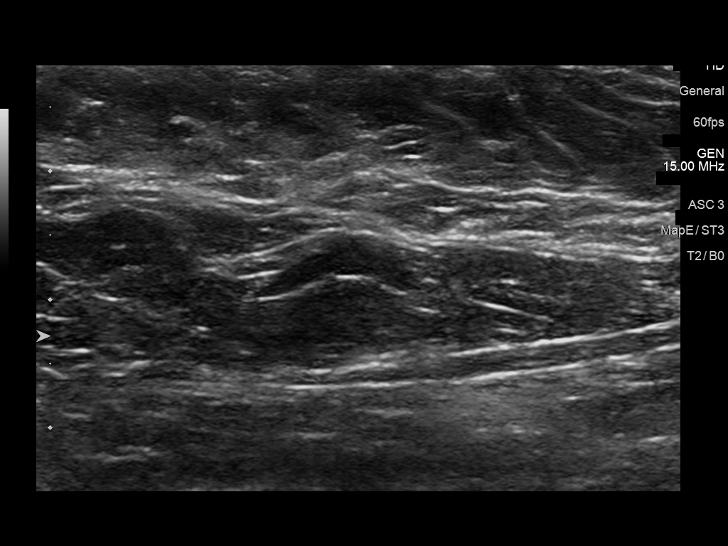

[12 of 18 positions shown; findings below may reference images not displayed]

ACR Breast Density Category b: There are scattered areas of
fibroglandular density.
FINDINGS: Partially obscured mass is confirmed within the inferior LEFT
breast, at anterior depth, measuring approximately 9 mm greatest
dimension. Additional tiny low-density mass is confirmed within the
inner quadrant of the LEFT breast, anterior to middle depth, best
seen on the spot compression CC projection, measuring approximately
3 mm greatest dimension.

Mammographic images were processed with CAD.

Targeted ultrasound is performed, showing an oval hypoechoic mass in
the LEFT breast at the 6 o'clock axis, subareolar, measuring 8 mm,
with partially indistinct margins, avascular, a likely correlate for
the mammographic finding.

Additional smaller hypoechoic mass is seen in the LEFT breast at the
5 o'clock axis, subareolar, measuring 3 mm, most suggestive of a
small complicated cyst with internal debris during real-time
ultrasound evaluation, likely corresponding as an incidental finding
unrelated to the additional mass seen on mammogram.

LEFT axilla was evaluated with ultrasound showing no enlarged or
morphologically abnormal lymph nodes.
IMPRESSION: 1. Hypoechoic mass in the LEFT breast at the 6 o'clock axis,
subareolar, measuring 8 mm, with partially indistinct margins, a
likely correlate for the dominant mass seen within the inferior LEFT
breast on mammogram. This mass is indeterminate and
ultrasound-guided biopsy is recommended to ensure benignity.
2. Additional probably benign complicated cyst within the LEFT
breast at the 5 o'clock axis, subareolar. If pathology result for
the LEFT breast mass at the 6 o'clock axis is benign, recommend
six-month follow-up LEFT breast ultrasound to ensure stability.
3. Additional tiny mass seen on mammogram within the inner LEFT
breast, without definite sonographic correlate. If pathology result
for the dominant LEFT breast mass is benign, recommend six-month
follow-up LEFT breast diagnostic mammogram to ensure stability.

RECOMMENDATION:
1. Ultrasound-guided biopsy for the LEFT breast mass at the 6
o'clock axis, subareolar, measuring 8 mm, likely correlate for the
dominant mass seen within the inferior LEFT breast on mammogram.
2. Postprocedure mammogram to ensure sonographic and mammographic
correspondence.
3. If pathology result for the ultrasound-guided biopsy is benign,
recommend follow-up LEFT breast diagnostic mammogram and ultrasound
to ensure stability of the additional LEFT breast findings described
above.

Ordering physician will be contacted with today's results and
patient will then be scheduled for ultrasound-guided core biopsy at
her earliest convenience.

I have discussed the findings and recommendations with the patient.
Results were also provided in writing at the conclusion of the
visit. If applicable, a reminder letter will be sent to the patient
regarding the next appointment.

BI-RADS CATEGORY  4: Suspicious.

## 2019-07-28 ENCOUNTER — Other Ambulatory Visit: Payer: Self-pay

## 2019-07-28 ENCOUNTER — Ambulatory Visit (INDEPENDENT_AMBULATORY_CARE_PROVIDER_SITE_OTHER): Payer: 59 | Admitting: Obstetrics and Gynecology

## 2019-07-28 ENCOUNTER — Encounter: Payer: Self-pay | Admitting: Obstetrics and Gynecology

## 2019-07-28 VITALS — BP 127/84 | HR 78 | Ht 64.0 in | Wt 238.8 lb

## 2019-07-28 DIAGNOSIS — Z01419 Encounter for gynecological examination (general) (routine) without abnormal findings: Secondary | ICD-10-CM | POA: Diagnosis not present

## 2019-07-28 NOTE — Patient Instructions (Signed)
 Preventive Care 21-42 Years Old, Female Preventive care refers to visits with your health care provider and lifestyle choices that can promote health and wellness. This includes:  A yearly physical exam. This may also be called an annual well check.  Regular dental visits and eye exams.  Immunizations.  Screening for certain conditions.  Healthy lifestyle choices, such as eating a healthy diet, getting regular exercise, not using drugs or products that contain nicotine and tobacco, and limiting alcohol use. What can I expect for my preventive care visit? Physical exam Your health care provider will check your:  Height and weight. This may be used to calculate body mass index (BMI), which tells if you are at a healthy weight.  Heart rate and blood pressure.  Skin for abnormal spots. Counseling Your health care provider may ask you questions about your:  Alcohol, tobacco, and drug use.  Emotional well-being.  Home and relationship well-being.  Sexual activity.  Eating habits.  Work and work environment.  Method of birth control.  Menstrual cycle.  Pregnancy history. What immunizations do I need?  Influenza (flu) vaccine  This is recommended every year. Tetanus, diphtheria, and pertussis (Tdap) vaccine  You may need a Td booster every 10 years. Varicella (chickenpox) vaccine  You may need this if you have not been vaccinated. Human papillomavirus (HPV) vaccine  If recommended by your health care provider, you may need three doses over 6 months. Measles, mumps, and rubella (MMR) vaccine  You may need at least one dose of MMR. You may also need a second dose. Meningococcal conjugate (MenACWY) vaccine  One dose is recommended if you are age 19-21 years and a first-year college student living in a residence hall, or if you have one of several medical conditions. You may also need additional booster doses. Pneumococcal conjugate (PCV13) vaccine  You may need  this if you have certain conditions and were not previously vaccinated. Pneumococcal polysaccharide (PPSV23) vaccine  You may need one or two doses if you smoke cigarettes or if you have certain conditions. Hepatitis A vaccine  You may need this if you have certain conditions or if you travel or work in places where you may be exposed to hepatitis A. Hepatitis B vaccine  You may need this if you have certain conditions or if you travel or work in places where you may be exposed to hepatitis B. Haemophilus influenzae type b (Hib) vaccine  You may need this if you have certain conditions. You may receive vaccines as individual doses or as more than one vaccine together in one shot (combination vaccines). Talk with your health care provider about the risks and benefits of combination vaccines. What tests do I need?  Blood tests  Lipid and cholesterol levels. These may be checked every 5 years starting at age 20.  Hepatitis C test.  Hepatitis B test. Screening  Diabetes screening. This is done by checking your blood sugar (glucose) after you have not eaten for a while (fasting).  Sexually transmitted disease (STD) testing.  BRCA-related cancer screening. This may be done if you have a family history of breast, ovarian, tubal, or peritoneal cancers.  Pelvic exam and Pap test. This may be done every 3 years starting at age 21. Starting at age 30, this may be done every 5 years if you have a Pap test in combination with an HPV test. Talk with your health care provider about your test results, treatment options, and if necessary, the need for more   tests. Follow these instructions at home: Eating and drinking   Eat a diet that includes fresh fruits and vegetables, whole grains, lean protein, and low-fat dairy.  Take vitamin and mineral supplements as recommended by your health care provider.  Do not drink alcohol if: ? Your health care provider tells you not to drink. ? You are  pregnant, may be pregnant, or are planning to become pregnant.  If you drink alcohol: ? Limit how much you have to 0-1 drink a day. ? Be aware of how much alcohol is in your drink. In the [place] one drink equals one 12 oz bottle of beer (355 mL), one 5 oz glass of wine (148 mL), or one 1 oz glass of hard liquor (44 mL). Lifestyle  Take daily care of your teeth and gums.  Stay active. Exercise for at least 30 minutes on 5 or more days each week.  Do not use any products that contain nicotine or tobacco, such as cigarettes, e-cigarettes, and chewing tobacco. If you need help quitting, ask your health care provider.  If you are sexually active, practice safe sex. Use a condom or other form of birth control (contraception) in order to prevent pregnancy and STIs (sexually transmitted infections). If you plan to become pregnant, see your health care provider for a preconception visit. What's next?  Visit your health care provider once a year for a well check visit.  Ask your health care provider how often you should have your eyes and teeth checked.  Stay up to date on all vaccines. This information is not intended to replace advice given to you by your health care provider. Make sure you discuss any questions you have with your health care provider. Document Released: 01/20/2002 Document Revised: 08/05/2018 Document Reviewed: 08/05/2018 Elsevier Patient Education  2020 [employer].

## 2019-07-28 NOTE — Progress Notes (Signed)
SUBJECTIVE:  43 y.o. female for annual routine checkup with no concerns.  OCP restarted last year, but stopped taking due to side effects. She is sexually active with single partner. No concerns for STIs. No vaginal concerns. She reports getting out of her home office to walk daily, diet has begun to change per DM2 dx, PCP following.   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 albuterol (VENTOLIN HFA) 108 (90 Base) MCG/ACT inhaler INHALE 2 PUFFS INTO THE LUNGS EVERY SIX HOURS AS NEEDED FOR WHEEZING OR SHORTNESS OF BREATH 18 g 1  . cetirizine (ZYRTEC) 10 MG tablet Take 10 mg by mouth as needed.      . ciprofloxacin (CIPRO) 500 MG tablet Take 1 tablet (500 mg total) by mouth 2 (two) times daily. 20 tablet 0  . cyanocobalamin (,VITAMIN B-12,) 1000 MCG/ML injection USE ONE INJECTION WEEKLY FOR 3 WEEKS, THEN MONTHLY 10 mL 0  . fluticasone (FLONASE) 50 MCG/ACT nasal spray Place 2 sprays into both nostrils daily. 16 g 0  . hydrochlorothiazide (HYDRODIURIL) 25 MG tablet TAKE 1 TABLET (25 MG TOTAL) BY MOUTH DAILY. 90 tablet 1  . levocetirizine (XYZAL) 5 MG tablet Take 1 tablet (5 mg total) by mouth every evening. 30 tablet 0  . neomycin-polymyxin-hydrocortisone (CORTISPORIN) OTIC solution Place 4 drops into the left ear 4 (four) times daily. 10 mL 0  . Vitamin D, Ergocalciferol, (DRISDOL) 50000 units CAPS capsule Take 1 capsule (50,000 Units total) by mouth 2 (two) times a week. (Patient taking differently: Take 50,000 Units by mouth every 30 (thirty) days. ) 30 capsule 2   Current Facility-Administered Medications  Medication Dose Route Frequency Provider Last Rate Last Dose  .  cyanocobalamin ((VITAMIN B-12)) injection 1,000 mcg  1,000 mcg Intramuscular Once Shella Maxim, NP       Allergies: Penicillins  No LMP recorded.  ROS:  Feeling well. No dyspnea or chest pain on exertion.  No abdominal pain, change in bowel habits, black or bloody stools.  No urinary tract symptoms. GYN ROS: normal menses, no abnormal bleeding, pelvic pain or discharge, no breast pain or new or enlarging lumps on self exam. No neurological complaints.  OBJECTIVE:  The patient appears well, alert, oriented x 3, in no distress. Today's Vitals   07/28/19 1506  BP: 127/84  Pulse: 78  Weight: 108.3 kg  Height: 5\' 4"  (1.626 m)   Body mass index is 40.99 kg/m. ENT normal.  Neck supple. No adenopathy or thyromegaly. PERLA. Lungs are clear, good air entry, no wheezes, rhonchi or rales. S1 and S2 normal, no murmurs, regular rate and rhythm. Abdomen soft without tenderness, guarding, mass or organomegaly. Extremities show no edema, normal peripheral pulses. Neurological is normal, no focal findings.  BREAST EXAM: breasts appear normal, no suspicious masses, no skin or nipple changes or axillary nodes  PELVIC EXAM: normal external genitalia, vulva, vagina, cervix, uterus and adnexa  ASSESSMENT:  well woman BMI 40  PLAN:  return for AE in 1 yr or sooner if needed Mammogram ordered  Silvestre Mesi, SNM

## 2019-08-02 ENCOUNTER — Other Ambulatory Visit: Payer: Self-pay | Admitting: Obstetrics and Gynecology

## 2019-08-02 DIAGNOSIS — R928 Other abnormal and inconclusive findings on diagnostic imaging of breast: Secondary | ICD-10-CM

## 2019-08-02 DIAGNOSIS — N6320 Unspecified lump in the left breast, unspecified quadrant: Secondary | ICD-10-CM

## 2019-08-02 DIAGNOSIS — Z1231 Encounter for screening mammogram for malignant neoplasm of breast: Secondary | ICD-10-CM

## 2019-08-09 ENCOUNTER — Other Ambulatory Visit: Payer: Self-pay

## 2019-08-09 DIAGNOSIS — R6889 Other general symptoms and signs: Secondary | ICD-10-CM | POA: Diagnosis not present

## 2019-08-09 DIAGNOSIS — Z20822 Contact with and (suspected) exposure to covid-19: Secondary | ICD-10-CM

## 2019-08-10 LAB — NOVEL CORONAVIRUS, NAA: SARS-CoV-2, NAA: NOT DETECTED

## 2019-08-18 ENCOUNTER — Ambulatory Visit
Admission: RE | Admit: 2019-08-18 | Discharge: 2019-08-18 | Disposition: A | Discharge status: Home / Self Care | Payer: 59 | Source: Ambulatory Visit | Attending: Obstetrics and Gynecology | Admitting: Obstetrics and Gynecology

## 2019-08-18 DIAGNOSIS — N6320 Unspecified lump in the left breast, unspecified quadrant: Secondary | ICD-10-CM

## 2019-08-18 DIAGNOSIS — R928 Other abnormal and inconclusive findings on diagnostic imaging of breast: Secondary | ICD-10-CM

## 2019-08-18 DIAGNOSIS — N6321 Unspecified lump in the left breast, upper outer quadrant: Secondary | ICD-10-CM | POA: Diagnosis not present

## 2019-08-18 DIAGNOSIS — Z1231 Encounter for screening mammogram for malignant neoplasm of breast: Secondary | ICD-10-CM

## 2019-09-06 ENCOUNTER — Ambulatory Visit: Payer: 59

## 2019-09-14 LAB — HM DIABETES EYE EXAM

## 2019-11-20 ENCOUNTER — Other Ambulatory Visit: Payer: Self-pay | Admitting: Internal Medicine

## 2019-11-20 DIAGNOSIS — E119 Type 2 diabetes mellitus without complications: Secondary | ICD-10-CM

## 2019-11-25 ENCOUNTER — Other Ambulatory Visit (INDEPENDENT_AMBULATORY_CARE_PROVIDER_SITE_OTHER): Payer: 59

## 2019-11-25 ENCOUNTER — Other Ambulatory Visit: Payer: Self-pay

## 2019-11-25 DIAGNOSIS — E119 Type 2 diabetes mellitus without complications: Secondary | ICD-10-CM

## 2019-11-25 LAB — COMPREHENSIVE METABOLIC PANEL
ALT: 16 U/L (ref 0–35)
AST: 13 U/L (ref 0–37)
Albumin: 4.2 g/dL (ref 3.5–5.2)
Alkaline Phosphatase: 60 U/L (ref 39–117)
BUN: 10 mg/dL (ref 6–23)
CO2: 29 mEq/L (ref 19–32)
Calcium: 9.3 mg/dL (ref 8.4–10.5)
Chloride: 99 mEq/L (ref 96–112)
Creatinine, Ser: 0.7 mg/dL (ref 0.40–1.20)
GFR: 110.97 mL/min (ref >60.00)
Glucose, Bld: 136 mg/dL — ABNORMAL HIGH (ref 70–99)
Potassium: 4.3 mEq/L (ref 3.5–5.1)
Sodium: 133 mEq/L — ABNORMAL LOW (ref 135–145)
Total Bilirubin: 0.2 mg/dL (ref 0.2–1.2)
Total Protein: 8 g/dL (ref 6.0–8.3)

## 2019-11-25 LAB — MICROALBUMIN / CREATININE URINE RATIO
Creatinine,U: 158 mg/dL
Microalb Creat Ratio: 1.3 mg/g (ref 0.0–30.0)
Microalb, Ur: 2 mg/dL — ABNORMAL HIGH (ref 0.0–1.9)

## 2019-11-25 LAB — HEMOGLOBIN A1C: Hgb A1c MFr Bld: 7 % — ABNORMAL HIGH (ref 4.6–6.5)

## 2019-12-13 ENCOUNTER — Encounter: Payer: Self-pay | Admitting: Internal Medicine

## 2019-12-13 ENCOUNTER — Ambulatory Visit (INDEPENDENT_AMBULATORY_CARE_PROVIDER_SITE_OTHER): Payer: 59 | Admitting: Internal Medicine

## 2019-12-13 ENCOUNTER — Other Ambulatory Visit: Payer: Self-pay

## 2019-12-13 DIAGNOSIS — E119 Type 2 diabetes mellitus without complications: Secondary | ICD-10-CM

## 2019-12-13 DIAGNOSIS — E785 Hyperlipidemia, unspecified: Secondary | ICD-10-CM

## 2019-12-13 DIAGNOSIS — I10 Essential (primary) hypertension: Secondary | ICD-10-CM

## 2019-12-13 MED ORDER — CYANOCOBALAMIN 1000 MCG/ML IJ SOLN: INTRAMUSCULAR | 1 refills | Status: DC

## 2019-12-13 NOTE — Assessment & Plan Note (Addendum)
Prior weight loss attempts reviewed.  Use of victoza in 2018 led to weight loss which was transient.  She has been following a more healthy lifestyle since she started working from home and walking daily. encourged to start walking for 30 minutes daily

## 2019-12-13 NOTE — Assessment & Plan Note (Signed)
LDL and triglycerides are at goal without  medications.   Lab Results  Component Value Date   CHOL 156 09/30/2018   HDL 49.10 09/30/2018   LDLCALC 92 09/30/2018   LDLDIRECT 118 06/13/2016   TRIG 74.0 09/30/2018   CHOLHDL 3 09/30/2018

## 2019-12-13 NOTE — Assessment & Plan Note (Signed)
Stopping hctz for two weeks.  Will add lisinopril 5 mg if resultant SBP is 130 or higher.

## 2019-12-13 NOTE — Assessment & Plan Note (Signed)
Historically  well-controlled on diet alone  . Patient is reminded to schedule an annual eye exam and foot exam is normal today. Patient has early microalbuminuria.  Trial of lisinopril after stopping hctz and reassessing blood pressure  Lab Results  Component Value Date   HGBA1C 7.0 (H) 11/25/2019   Lab Results  Component Value Date   MICROALBUR 2.0 (H) 11/25/2019

## 2019-12-13 NOTE — Progress Notes (Signed)
Virtual Visit via [email or web address]  This visit type was conducted due to national recommendations for restrictions regarding the COVID-19 pandemic (e.g. social distancing).  This format is felt to be most appropriate for this patient at this time.  All issues noted in this document were discussed and addressed.  No physical exam was performed (except for noted visual exam findings with Video Visits).   I connected with@ on 12/13/19 at  3:00 PM EST by a video enabled telemedicine applicationand verified  that I am speaking with the correct person using two identifiers. Location patient: home Location provider: work or home office Persons participating in the virtual visit: patient, provider  I discussed the limitations, risks, security and privacy concerns of performing an evaluation and management service by telephone and the availability of in person appointments. I also discussed with the patient that there may be a patient responsible charge related to this service. The patient expressed understanding and agreed to proceed.  Reason for visit: follow up on T2DM, hypertension,  obeisty and hyperlipidemia   HPI:    Morbid obesity : reviewed past attempts to  lose weight,  Personal best 214 in 2018 was achieved with diet and exercise,  She is workimg from home and has 80 extra minutes daily due to not commuting .  Has a treadmill and making a concerted effort to get on it for 30 minutes  T2DM:  she feels generally well, is walking on the treadmill once or twice  per week and checking blood sugars once daily at variable times.  BS have been under 130 fasting and < 150 post prandially.  Denies any recent hypoglyemic events.  Taking his medications as directed. Following a carbohydrate modified diet 6 days per week. Denies numbness, burning and tingling of extremities. Appetite is good.     Hypertension: patient checks blood pressure twice weekly at home.  Readings have been for the most part < 110/70 at  rest . Patient is following a reduce salt diet most days and is taking  hctz  as prescribed   ROS: See pertinent positives and negatives per HPI.  Past Medical History:  Diagnosis Date  . Acid reflux   . allergic rhinitis   . Anxiety   . Asthma    allergy induced  . Hallux abducto valgus, left   . Hypertension   . Obesity   . Obesity (BMI 30-39.9)   . Pre-diabetes   . Prediabetes   . Vitamin D deficiency     Past Surgical History:  Procedure Laterality Date  . Barbie Banner OSTEOTOMY Left 11/29/2018   Procedure: [employer];  Surgeon: Landis Martins, DPM;  Location: [place];  Service: Podiatry;  Laterality: Left;  . BREAST BIOPSY Left 2019   venus clip, [place] bx, fibroadenoma  . BUNIONECTOMY Left 11/29/2018   Procedure: Jeralene Huff BUNIONECTOMY LEFT FOOT;  Surgeon: Landis Martins, DPM;  Location: [place];  Service: Podiatry;  Laterality: Left;  . none    . WISDOM TOOTH EXTRACTION      Family History  Problem Relation Age of Onset  . Heart disease Paternal Aunt 69       sudden death  . Heart disease Father        sudden cardiac death  . Diabetes Father   . Hypertension Father   . Sleep apnea Father   . Diabetes Other   . Lung cancer Other   . Cancer Other   . Diabetes Mother   . Sleep apnea  Mother   . Obesity Mother   . Cancer Maternal Grandmother   . Cancer Maternal Grandfather   . Breast cancer Neg Hx     SOCIAL HX:  reports that she has never smoked. She has never used smokeless tobacco. She reports current alcohol use of about 5.0 standard drinks of alcohol per week. She reports that she does not use drugs.   Current Outpatient Medications:  .  cetirizine (ZYRTEC) 10 MG tablet, Take 10 mg by mouth as needed.  , Disp: , Rfl:  .  cyanocobalamin (,VITAMIN B-12,) 1000 MCG/ML injection, USE ONE INJECTION WEEKLY FOR 3 WEEKS, THEN MONTHLY, Disp: 10 mL, Rfl: 1 .  hydrochlorothiazide (HYDRODIURIL) 25 MG tablet, TAKE 1 TABLET (25 MG TOTAL)  BY MOUTH DAILY., Disp: 90 tablet, Rfl: 1 .  Vitamin D, Ergocalciferol, (DRISDOL) 50000 units CAPS capsule, Take 1 capsule (50,000 Units total) by mouth 2 (two) times a week. (Patient taking differently: Take 50,000 Units by mouth every 30 (thirty) days. ), Disp: 30 capsule, Rfl: 2 .  albuterol (VENTOLIN HFA) 108 (90 Base) MCG/ACT inhaler, INHALE 2 PUFFS INTO THE LUNGS EVERY SIX HOURS AS NEEDED FOR WHEEZING OR SHORTNESS OF BREATH (Patient not taking: Reported on 12/13/2019), Disp: 18 g, Rfl: 1  Current Facility-Administered Medications:  .  cyanocobalamin ((VITAMIN B-12)) injection 1,000 mcg, 1,000 mcg, Intramuscular, Once, Shella Maxim, NP  EXAM:  VITALS per patient if applicable:  GENERAL: alert, oriented, appears well and in no acute distress  HEENT: atraumatic, conjunttiva clear, no obvious abnormalities on inspection of external nose and ears  NECK: normal movements of the head and neck  LUNGS: on inspection no signs of respiratory distress, breathing rate appears normal, no obvious gross SOB, gasping or wheezing  CV: no obvious cyanosis  MS: moves all visible extremities without noticeable abnormality  PSYCH/NEURO: pleasant and cooperative, no obvious depression or anxiety, speech and thought processing grossly intact  ASSESSMENT AND PLAN:  Discussed the following assessment and plan:  Morbid obesity ([place])  Hyperlipidemia, unspecified hyperlipidemia type  Type 2 diabetes mellitus without complication, without long-term current use of insulin ([place])  Hypertension, unspecified type  Morbid obesity ([place]) Prior weight loss attempts reviewed.  Use of victoza in 2018 led to weight loss which was transient.  She has been following a more healthy lifestyle since she started working from home and walking daily. encourged to start walking for 30 minutes daily  Hyperlipidemia LDL and triglycerides are at goal without  medications.   Lab Results  Component Value Date   CHOL 156  09/30/2018   HDL 49.10 09/30/2018   LDLCALC 92 09/30/2018   LDLDIRECT 118 06/13/2016   TRIG 74.0 09/30/2018   CHOLHDL 3 09/30/2018     Type 2 diabetes mellitus without complication, without long-term current use of insulin ([place]) Historically  well-controlled on diet alone  . Patient is reminded to schedule an annual eye exam and foot exam is normal today. Patient has early microalbuminuria.  Trial of lisinopril after stopping hctz and reassessing blood pressure  Lab Results  Component Value Date   HGBA1C 7.0 (H) 11/25/2019   Lab Results  Component Value Date   MICROALBUR 2.0 (H) 11/25/2019     Hypertension Stopping hctz for two weeks.  Will add lisinopril 5 mg if resultant SBP is 130 or higher.     I discussed the assessment and treatment plan with the patient. The patient was provided an opportunity to ask questions and all were answered. The patient agreed with  the plan and demonstrated an understanding of the instructions.   The patient was advised to call back or seek an in-person evaluation if the symptoms worsen or if the condition fails to improve as anticipated.   I provided  30 minutes of non-face-to-face time during this encounter reviewing patient's current problems and past procedures/imaging studies, providing counseling on the above mentioned problems , and coordination  of care . Crecencio Mc, MD

## 2020-06-13 ENCOUNTER — Ambulatory Visit: Payer: 59 | Admitting: Internal Medicine

## 2020-06-13 ENCOUNTER — Other Ambulatory Visit: Payer: Self-pay

## 2020-06-13 ENCOUNTER — Encounter: Payer: Self-pay | Admitting: Internal Medicine

## 2020-06-13 DIAGNOSIS — E538 Deficiency of other specified B group vitamins: Secondary | ICD-10-CM

## 2020-06-13 DIAGNOSIS — E785 Hyperlipidemia, unspecified: Secondary | ICD-10-CM | POA: Diagnosis not present

## 2020-06-13 DIAGNOSIS — E1169 Type 2 diabetes mellitus with other specified complication: Secondary | ICD-10-CM | POA: Diagnosis not present

## 2020-06-13 DIAGNOSIS — E669 Obesity, unspecified: Secondary | ICD-10-CM

## 2020-06-13 DIAGNOSIS — I10 Essential (primary) hypertension: Secondary | ICD-10-CM | POA: Diagnosis not present

## 2020-06-13 DIAGNOSIS — E559 Vitamin D deficiency, unspecified: Secondary | ICD-10-CM

## 2020-06-13 DIAGNOSIS — E1159 Type 2 diabetes mellitus with other circulatory complications: Secondary | ICD-10-CM

## 2020-06-13 DIAGNOSIS — E119 Type 2 diabetes mellitus without complications: Secondary | ICD-10-CM | POA: Diagnosis not present

## 2020-06-13 LAB — COMPREHENSIVE METABOLIC PANEL
ALT: 12 U/L (ref 0–35)
AST: 10 U/L (ref 0–37)
Albumin: 4.1 g/dL (ref 3.5–5.2)
Alkaline Phosphatase: 55 U/L (ref 39–117)
BUN: 10 mg/dL (ref 6–23)
CO2: 26 mEq/L (ref 19–32)
Calcium: 9.1 mg/dL (ref 8.4–10.5)
Chloride: 103 mEq/L (ref 96–112)
Creatinine, Ser: 0.77 mg/dL (ref 0.40–1.20)
GFR: 99.15 mL/min (ref >60.00)
Glucose, Bld: 144 mg/dL — ABNORMAL HIGH (ref 70–99)
Potassium: 4.4 mEq/L (ref 3.5–5.1)
Sodium: 137 mEq/L (ref 135–145)
Total Bilirubin: 0.3 mg/dL (ref 0.2–1.2)
Total Protein: 7.1 g/dL (ref 6.0–8.3)

## 2020-06-13 LAB — LIPID PANEL
Cholesterol: 163 mg/dL (ref 0–200)
HDL: 48 mg/dL (ref >39.00)
LDL Cholesterol: 96 mg/dL (ref 0–99)
NonHDL: 115.16
Total CHOL/HDL Ratio: 3
Triglycerides: 98 mg/dL (ref 0.0–149.0)
VLDL: 19.6 mg/dL (ref 0.0–40.0)

## 2020-06-13 LAB — HEMOGLOBIN A1C: Hgb A1c MFr Bld: 6.7 % — ABNORMAL HIGH (ref 4.6–6.5)

## 2020-06-13 LAB — VITAMIN B12: Vitamin B-12: 491 pg/mL (ref 211–911)

## 2020-06-13 LAB — TSH: TSH: 1.63 u[IU]/mL (ref 0.35–4.50)

## 2020-06-13 NOTE — Assessment & Plan Note (Addendum)
LDL <100 and and triglycerides are at goal without  medications.   Lab Results  Component Value Date   CHOL 163 06/13/2020   HDL 48.00 06/13/2020   LDLCALC 96 06/13/2020   LDLDIRECT 118 06/13/2016   TRIG 98.0 06/13/2020   CHOLHDL 3 06/13/2020

## 2020-06-13 NOTE — Assessment & Plan Note (Addendum)
Remains  well-controlled on diet alone  . Patient is reminded to schedule an annual eye exam and foot exam is normal today. Patient has early microalbuminuria.  She has microalbuminuria but BP is normal  after stopping hctz .  Will recommend trial of lisinopril.  Lab Results  Component Value Date   HGBA1C 6.7 (H) 06/13/2020   Lab Results  Component Value Date   MICROALBUR 2.0 (H) 11/25/2019

## 2020-06-13 NOTE — Patient Instructions (Signed)
WAY TO GO!!!  You have cured your hypertension with your lifestyle changes!  See you in 6 months

## 2020-06-13 NOTE — Assessment & Plan Note (Signed)
I have congratulated her in reduction of   BMI and encouraged  Continued weight loss with goal of 10% of body weight over the next 6 months using a low glycemic index diet and regular exercise a minimum of 5 days per week.     

## 2020-06-13 NOTE — Progress Notes (Signed)
Subjective:  Patient ID: DIMITRA WOODSTOCK, female    DOB: 06-05-1976  Age: 44 y.o. MRN: [id number]  CC: The primary encounter diagnosis was Morbid obesity ([place]). Diagnoses of Type 2 diabetes mellitus without complication, without long-term current use of insulin ([place]), Hyperlipidemia, unspecified hyperlipidemia type, B12 deficiency, Vitamin D deficiency, and Obesity, diabetes, and hypertension syndrome ([place]) were also pertinent to this visit.  HPI Orion L Mauch presents for follow up on type 2 DM with obesity  hypertension and hyperlipidemia .   This visit occurred during the SARS-CoV-2 public health emergency.  Safety protocols were in place, including screening questions prior to the visit, additional usage of staff PPE, and extensive cleaning of exam room while observing appropriate contact time as indicated for disinfecting solutions.     Has lost 21 lbs since last visit (250 to 229) starting in February  With exercise at home,  [place] [employer] started in march and low carb diet  .  Patient does not check blood sugars .   No complaints today.  She is now  exercising on a regular basis and losing weight.  Patient voices awareness  of the foods he/she needs to avoid,  And follows a low GI diet about 100% of the time.  Has  had an annual diabetic eye exam.  Denies numbness and tingling in lower extremities.  Denies hypoglycemic symptoms.   Dr Manya Silvas at [facility] in [address] did her bunion surgery on left great toe  dec 2010   Outpatient Medications Prior to Visit  Medication Sig Dispense Refill  . albuterol (VENTOLIN HFA) 108 (90 Base) MCG/ACT inhaler INHALE 2 PUFFS INTO THE LUNGS EVERY SIX HOURS AS NEEDED FOR WHEEZING OR SHORTNESS OF BREATH 18 g 1  . cetirizine (ZYRTEC) 10 MG tablet Take 10 mg by mouth as needed.      . cyanocobalamin (,VITAMIN B-12,) 1000 MCG/ML injection USE ONE INJECTION WEEKLY FOR 3 WEEKS, THEN MONTHLY 10 mL 1  . Vitamin D, Ergocalciferol, (DRISDOL) 50000 units CAPS capsule Take 1  capsule (50,000 Units total) by mouth 2 (two) times a week. (Patient taking differently: Take 50,000 Units by mouth every 30 (thirty) days. ) 30 capsule 2  . hydrochlorothiazide (HYDRODIURIL) 25 MG tablet TAKE 1 TABLET (25 MG TOTAL) BY MOUTH DAILY. 90 tablet 1   Facility-Administered Medications Prior to Visit  Medication Dose Route Frequency Provider Last Rate Last Admin  . cyanocobalamin ((VITAMIN B-12)) injection 1,000 mcg  1,000 mcg Intramuscular Once Shella Maxim, NP        Review of Systems;  Patient denies headache, fevers, malaise, unintentional weight loss, skin rash, eye pain, sinus congestion and sinus pain, sore throat, dysphagia,  hemoptysis , cough, dyspnea, wheezing, chest pain, palpitations, orthopnea, edema, abdominal pain, nausea, melena, diarrhea, constipation, flank pain, dysuria, hematuria, urinary  Frequency, nocturia, numbness, tingling, seizures,  Focal weakness, Loss of consciousness,  Tremor, insomnia, depression, anxiety, and suicidal ideation.      Objective:  BP 126/82 (BP Location: Left Arm, Patient Position: Sitting, Cuff Size: Normal)   Pulse 78   Temp 98.3 F (36.8 C) (Temporal)   Resp 15   Ht 5\' 4"  (1.626 m)   Wt 234 lb 6.4 oz (106.3 kg)   SpO2 99%   BMI 40.23 kg/m   BP Readings from Last 3 Encounters:  06/13/20 126/82  12/13/19 111/78  07/28/19 127/84    Wt Readings from Last 3 Encounters:  06/13/20 234 lb 6.4 oz (106.3 kg)  12/13/19 240 lb (  108.9 kg)  07/28/19 238 lb 12.8 oz (108.3 kg)    General appearance: alert, cooperative and appears stated age Ears: normal TM's and external ear canals both ears Throat: lips, mucosa, and tongue normal; teeth and gums normal Neck: no adenopathy, no carotid bruit, supple, symmetrical, trachea midline and thyroid not enlarged, symmetric, no tenderness/mass/nodules Back: symmetric, no curvature. ROM normal. No CVA tenderness. Lungs: clear to auscultation bilaterally Heart: regular rate and rhythm, S1,  S2 normal, no murmur, click, rub or gallop Abdomen: soft, non-tender; bowel sounds normal; no masses,  no organomegaly Pulses: 2+ and symmetric Skin: Skin color, texture, turgor normal. No rashes or lesions Lymph nodes: Cervical, supraclavicular, and axillary nodes normal.    Assessment & Plan:   Problem List Items Addressed This Visit      Unprioritized   Morbid obesity ([place]) - Primary    I have congratulated her in reduction of   BMI and encouraged  Continued weight loss with goal of 10% of body weight over the next 6 months using a low glycemic index diet and regular exercise a minimum of 5 days per week.        Relevant Orders   TSH (Completed)   Vitamin D deficiency   Relevant Orders   VITAMIN D 25 Hydroxy (Vit-D Deficiency, Fractures)   Obesity, diabetes, and hypertension syndrome ([place])    Remains  well-controlled on diet alone  . Patient is reminded to schedule an annual eye exam and foot exam is normal today. Patient has early microalbuminuria.  She has microalbuminuria but BP is normal  after stopping hctz .  Will recommend trial of lisinopril.  Lab Results  Component Value Date   HGBA1C 6.7 (H) 06/13/2020   Lab Results  Component Value Date   MICROALBUR 2.0 (H) 11/25/2019         Relevant Medications   losartan (COZAAR) 25 MG tablet   Hyperlipidemia    LDL <100 and and triglycerides are at goal without  medications.   Lab Results  Component Value Date   CHOL 163 06/13/2020   HDL 48.00 06/13/2020   LDLCALC 96 06/13/2020   LDLDIRECT 118 06/13/2016   TRIG 98.0 06/13/2020   CHOLHDL 3 06/13/2020         Relevant Medications   losartan (COZAAR) 25 MG tablet   Other Relevant Orders   Lipid panel (Completed)   B12 deficiency   Relevant Orders   Vitamin B12 (Completed)      I have discontinued Parys L. Gronewold's hydrochlorothiazide. I am also having her start on losartan. Additionally, I am having her maintain her cetirizine, Vitamin D (Ergocalciferol),  albuterol, and cyanocobalamin. We will continue to administer cyanocobalamin.  Meds ordered this encounter  Medications  . losartan (COZAAR) 25 MG tablet    Sig: Take 1 tablet (25 mg total) by mouth at bedtime.    Dispense:  30 tablet    Refill:  5    Medications Discontinued During This Encounter  Medication Reason  . hydrochlorothiazide (HYDRODIURIL) 25 MG tablet Patient has not taken in last 30 days    Follow-up: No follow-ups on file.   Crecencio Mc, MD

## 2020-06-16 ENCOUNTER — Telehealth: Payer: Self-pay | Admitting: Internal Medicine

## 2020-06-16 DIAGNOSIS — E1159 Type 2 diabetes mellitus with other circulatory complications: Secondary | ICD-10-CM

## 2020-06-16 MED ORDER — LOSARTAN POTASSIUM 25 MG PO TABS: 25.0000 mg | ORAL_TABLET | Freq: Every day | ORAL | 5 refills | Status: DC

## 2020-06-16 NOTE — Telephone Encounter (Signed)
My Chart message sent

## 2020-06-21 NOTE — Addendum Note (Signed)
Addended by: Crecencio Mc on: 06/21/2020 09:05 AM   Modules accepted: Orders

## 2020-07-04 ENCOUNTER — Other Ambulatory Visit (INDEPENDENT_AMBULATORY_CARE_PROVIDER_SITE_OTHER): Payer: 59

## 2020-07-04 ENCOUNTER — Other Ambulatory Visit: Payer: Self-pay

## 2020-07-04 DIAGNOSIS — E1169 Type 2 diabetes mellitus with other specified complication: Secondary | ICD-10-CM | POA: Diagnosis not present

## 2020-07-04 DIAGNOSIS — E669 Obesity, unspecified: Secondary | ICD-10-CM

## 2020-07-04 DIAGNOSIS — E1159 Type 2 diabetes mellitus with other circulatory complications: Secondary | ICD-10-CM

## 2020-07-04 DIAGNOSIS — I10 Essential (primary) hypertension: Secondary | ICD-10-CM

## 2020-07-04 DIAGNOSIS — I152 Hypertension secondary to endocrine disorders: Secondary | ICD-10-CM

## 2020-07-05 LAB — BASIC METABOLIC PANEL
BUN: 6 mg/dL (ref 6–23)
CO2: 26 mEq/L (ref 19–32)
Calcium: 9 mg/dL (ref 8.4–10.5)
Chloride: 101 mEq/L (ref 96–112)
Creatinine, Ser: 0.74 mg/dL (ref 0.40–1.20)
GFR: 103.78 mL/min (ref >60.00)
Glucose, Bld: 108 mg/dL — ABNORMAL HIGH (ref 70–99)
Potassium: 4.4 mEq/L (ref 3.5–5.1)
Sodium: 132 mEq/L — ABNORMAL LOW (ref 135–145)

## 2020-07-05 LAB — MICROALBUMIN / CREATININE URINE RATIO
Creatinine,U: 29.2 mg/dL
Microalb Creat Ratio: 2.4 mg/g (ref 0.0–30.0)
Microalb, Ur: <0.7 mg/dL (ref 0.0–1.9)

## 2020-07-11 ENCOUNTER — Other Ambulatory Visit: Payer: Self-pay | Admitting: Physician Assistant

## 2020-07-11 ENCOUNTER — Ambulatory Visit: Payer: 59 | Admitting: Orthopaedic Surgery

## 2020-07-11 ENCOUNTER — Encounter: Payer: Self-pay | Admitting: Orthopaedic Surgery

## 2020-07-11 ENCOUNTER — Ambulatory Visit (INDEPENDENT_AMBULATORY_CARE_PROVIDER_SITE_OTHER): Payer: 59

## 2020-07-11 ENCOUNTER — Ambulatory Visit: Payer: Self-pay

## 2020-07-11 VITALS — Ht 65.0 in | Wt 238.0 lb

## 2020-07-11 DIAGNOSIS — M25532 Pain in left wrist: Secondary | ICD-10-CM

## 2020-07-11 DIAGNOSIS — M25531 Pain in right wrist: Secondary | ICD-10-CM

## 2020-07-11 MED ORDER — MELOXICAM 7.5 MG PO TABS
ORAL_TABLET | ORAL | 2 refills | Status: DC
Start: 2020-07-11 — End: 2020-09-20

## 2020-07-11 MED ORDER — PREDNISONE 5 MG (21) PO TBPK
ORAL_TABLET | ORAL | 0 refills | Status: DC
Start: 2020-07-11 — End: 2020-08-01

## 2020-07-11 NOTE — Progress Notes (Signed)
Office Visit Note   Patient: Katelyn Wilson           Date of Birth: 01/19/1976           MRN: [id number] Visit Date: 07/11/2020              Requested by: Crecencio Mc, MD [address] [place],  [place] [address] PCP: Crecencio Mc, MD   Assessment & Plan: Visit Diagnoses:  1. 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  In regards to both wrist, we will start her on a Sterapred taper and provide her with a removable thumb spica splint.  If she does not significantly improve over the next several weeks she will come back in for repeat evaluation.  Follow-Up Instructions: Return if symptoms worsen or fail to improve.   Orders:  Orders Placed This Encounter  Procedures  . XR Wrist Complete Left  . XR Wrist Complete Right   Meds ordered this encounter  Medications  . predniSONE (STERAPRED UNI-PAK 21 TAB) 5 MG (21) TBPK tablet    Sig: Take as directed    Dispense:  21 tablet    Refill:  0  . meloxicam (MOBIC) 7.5 MG tablet    Sig: Take one table daily prn once finished with sterapred taper    Dispense:  30 tablet    Refill:  2      Procedures: No procedures performed   Clinical Data: No additional findings.   Subjective: Chief Complaint  Patient presents with  . Left Wrist - Pain  . Right Wrist - Pain    HPI patient is a pleasant 44 year old right-hand-dominant female who comes in today with bilateral wrist pain right greater than left.  This has been ongoing for the past several years but has recently worsened.  She does work in [employer] and does quite a bit of typing on a daily basis.  She has had no specific injury but notes that her pain worsened after starting [facility] in March where she was doing a lot of push-ups.  The pain she has on the right side is along the first [facility] joint and along the ulnar styloid.  The pain on the  left is along the first [facility] joint.  No numbness, tingling or burning.  The pain is aggravated when doing push-ups or lifting anything with significant weight.  Review of Systems as detailed in HPI.  All others reviewed and are negative.   Objective: Vital Signs: Ht 5\' 5"  (1.651 m)   Wt 238 lb (108 kg)   BMI 39.61 kg/m   Physical Exam well-developed well-nourished female no acute distress.  Alert and oriented x3.  Ortho Exam examination of the right wrist reveals no swelling.  She does not exhibit any tenderness along the first dorsal compartment.  Negative Finkelstein.  She has some mild to moderate tenderness along the first [facility] joint with a mildly positive grind test.  She does have increased pain with compression of the TFCC and along the ulnar styloid.  Left wrist has mild tenderness to the first [facility] joint.  No tenderness along the first dorsal compartment and negative Finkelstein.  No ulnar-sided tenderness.  She is neurovascularly intact distally.  Specialty Comments:  No specialty comments available.  Imaging: XR Wrist Complete Left  Result Date: 07/11/2020 Mild [facility]MC arthrosis with slight subluxation  of the thumb metacarpal  XR Wrist Complete Right  Result Date: 07/11/2020 Mild [facility] arthrosis with slight thumb metacarpal subluxation    PMFS History: Patient Active Problem List   Diagnosis Date Noted  . Preoperative clearance 11/22/2018  . B12 deficiency 09/09/2017  . Hyperlipidemia 02/03/2017  . Shortness of breath 12/23/2016  . Obesity, diabetes, and hypertension syndrome ([place]) 12/23/2016  . Vitamin D deficiency 12/02/2016  . Cough variant asthma 12/05/2015  . Morbid obesity ([place]) 10/23/2013  . Fibroid uterus 06/22/2013  . Contraceptive management 11/16/2011  . Family history of hypertrophic cardiomyopathy 06/18/2011   Past Medical History:  Diagnosis Date  . Acid reflux   . allergic rhinitis   . Anxiety   . Asthma    allergy induced  . Hallux abducto valgus,  left   . Hypertension   . Obesity   . Obesity (BMI 30-39.9)   . Pre-diabetes   . Prediabetes   . Vitamin D deficiency     Family History  Problem Relation Age of Onset  . Heart disease Paternal Aunt 104       sudden death  . Heart disease Father        sudden cardiac death  . Diabetes Father   . Hypertension Father   . Sleep apnea Father   . Diabetes Other   . Lung cancer Other   . Cancer Other   . Diabetes Mother   . Sleep apnea Mother   . Obesity Mother   . Cancer Maternal Grandmother   . Cancer Maternal Grandfather   . Breast cancer Neg Hx     Past Surgical History:  Procedure Laterality Date  . Barbie Banner OSTEOTOMY Left 11/29/2018   Procedure: [employer];  Surgeon: Landis Martins, DPM;  Location: [place];  Service: Podiatry;  Laterality: Left;  . BREAST BIOPSY Left 2019   venus clip, [place] bx, fibroadenoma  . BUNIONECTOMY Left 11/29/2018   Procedure: Jeralene Huff BUNIONECTOMY LEFT FOOT;  Surgeon: Landis Martins, DPM;  Location: [place];  Service: Podiatry;  Laterality: Left;  . none    . WISDOM TOOTH EXTRACTION     Social History   Occupational History  . Occupation: Scientist, research (physical sciences): [employer]  . Occupation: Environmental health practitioner: [employer]  Tobacco Use  . Smoking status: Never Smoker  . Smokeless tobacco: Never Used  Substance and Sexual Activity  . Alcohol use: Yes    Alcohol/week: 5.0 standard drinks    Types: 5 Standard drinks or equivalent per week    Comment: occasional  . Drug use: No  . Sexual activity: Yes    Birth control/protection: Condom

## 2020-07-29 IMAGING — MG MM DIGITAL DIAGNOSTIC UNILAT*L* W/ TOMO W/ CAD
4 series · 4 of 12 positions shown · non-contrast
Comparison: Baseline screening mammogram dated 08/10/2018.

CLINICAL DATA: Patient returns today to evaluate a possible LEFT
breast masses identified on recent baseline screening mammogram.

EXAM:
DIGITAL DIAGNOSTIC LEFT MAMMOGRAM WITH CAD AND TOMO
ULTRASOUND LEFT BREAST

[L CC synth-2D]
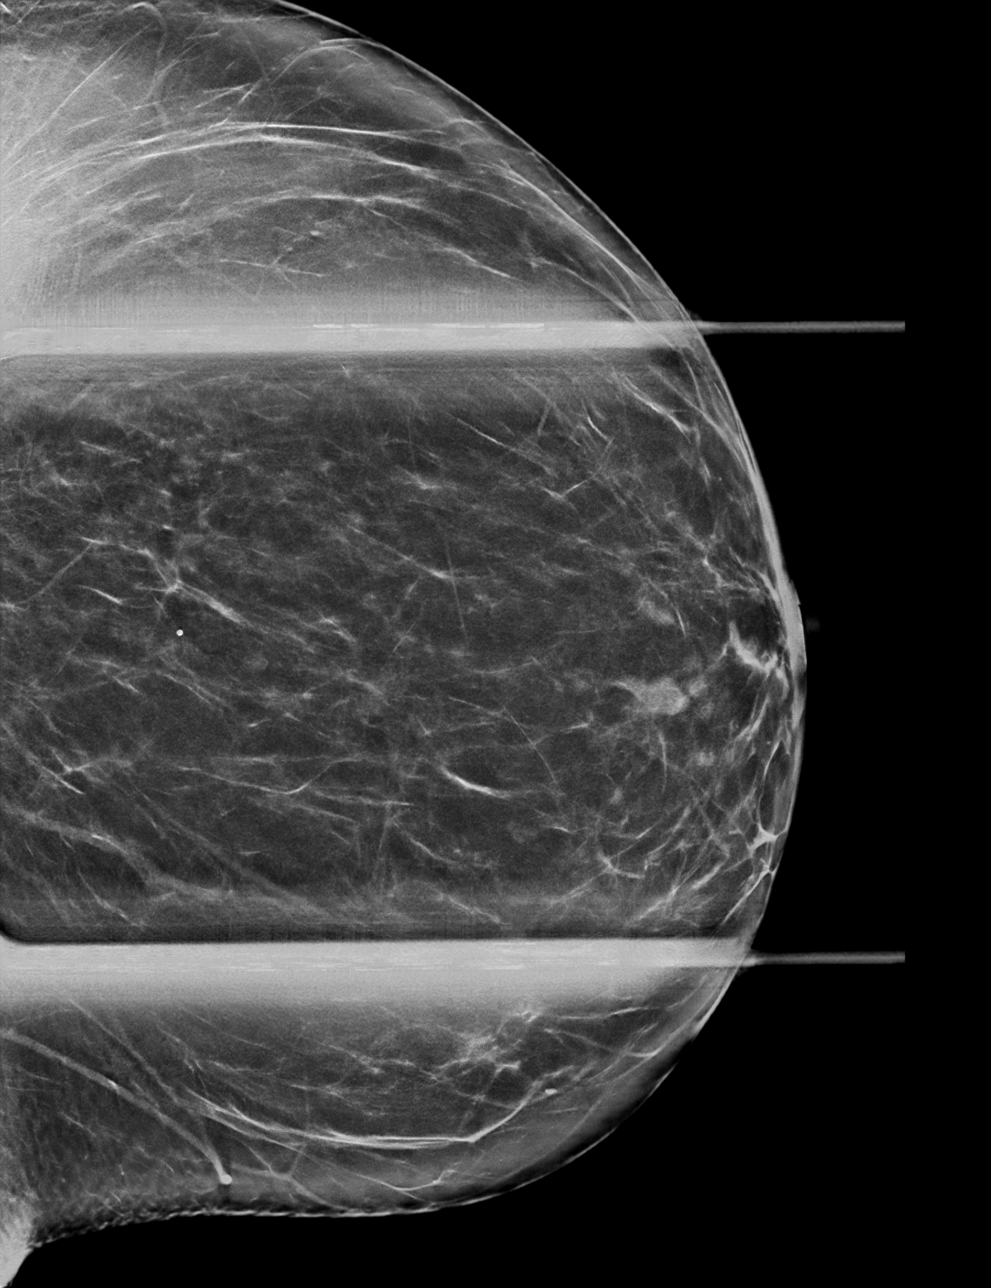

[L MLO synth-2D]
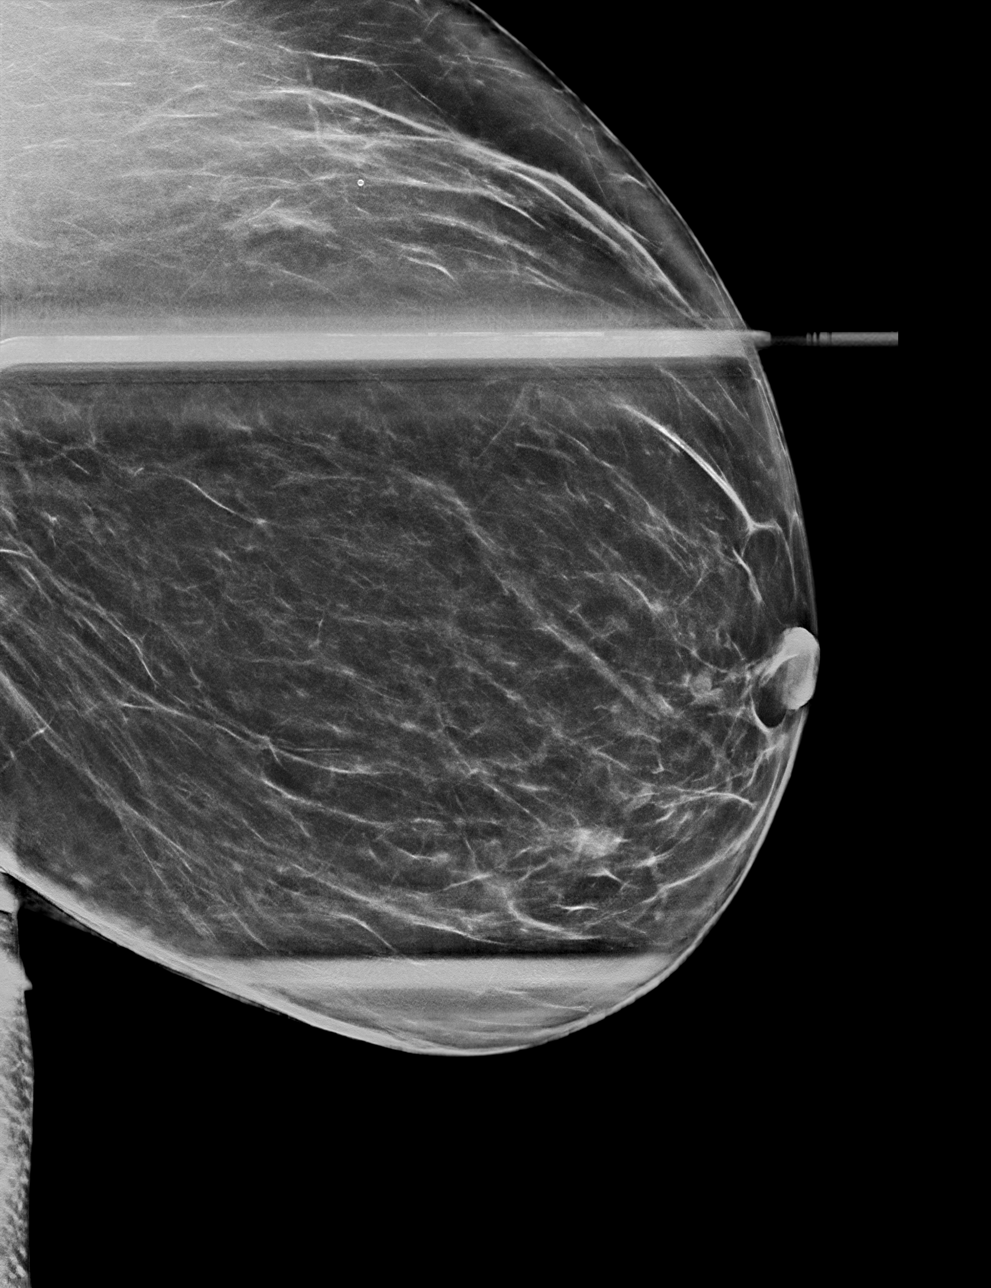

[L MLO tomo · tomo slice 42/83.0]
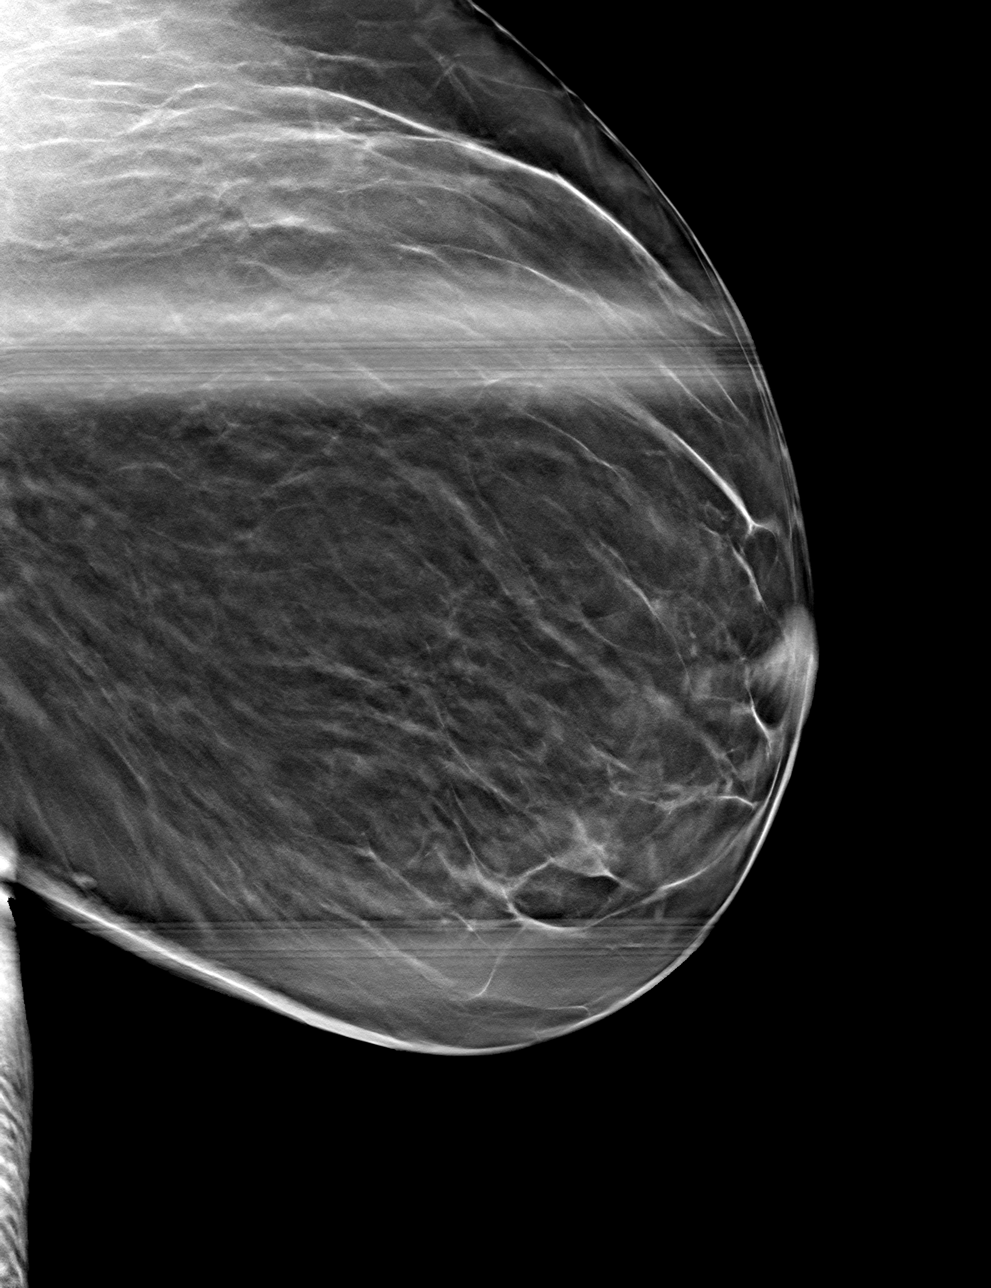

[L CC tomo · tomo slice 38/75.0]
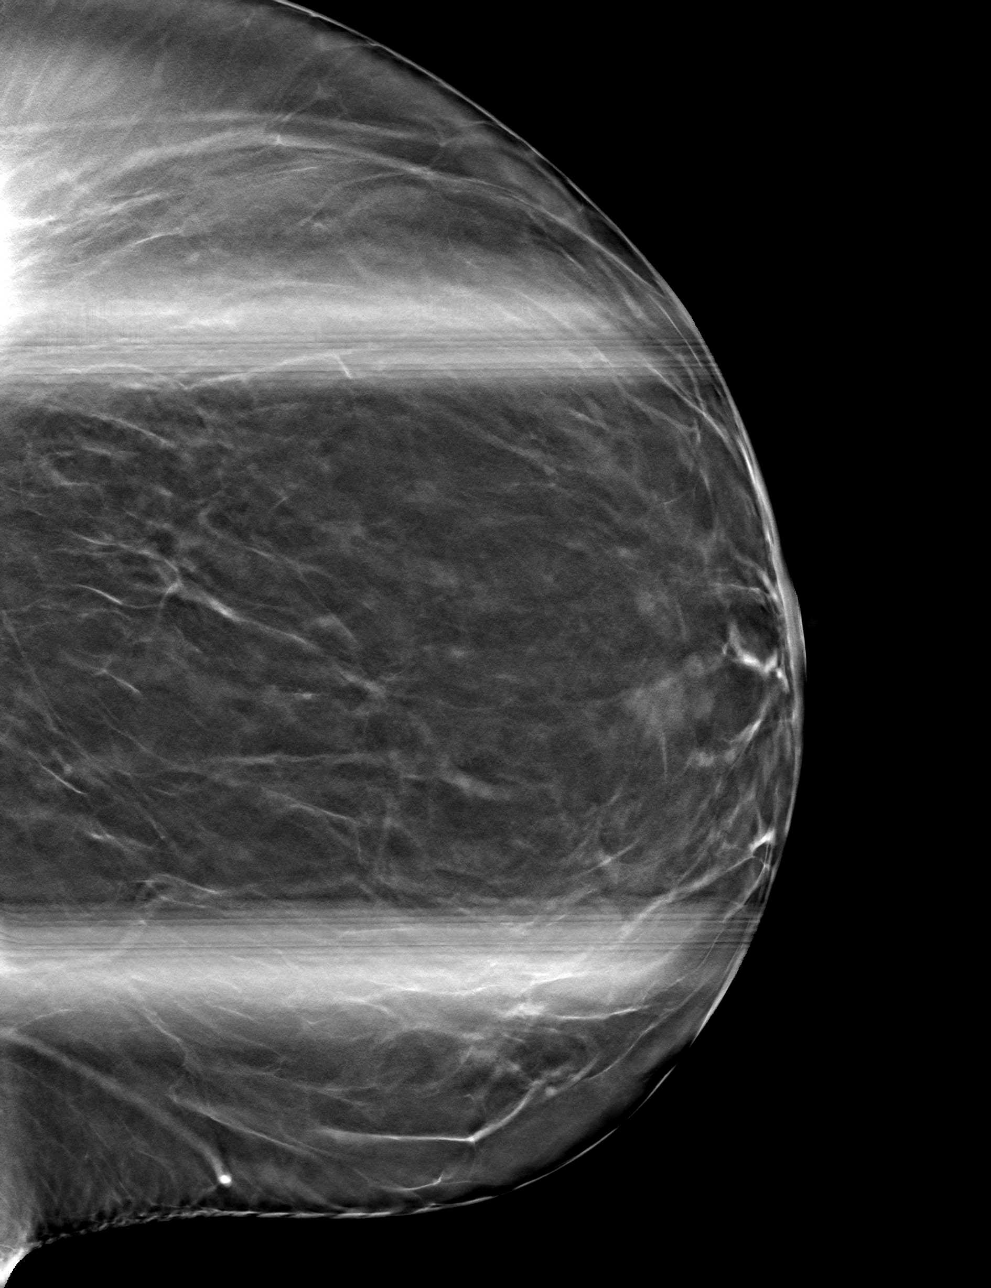

[4 of 12 positions shown; findings below may reference images not displayed]

ACR Breast Density Category b: There are scattered areas of
fibroglandular density.
FINDINGS: Partially obscured mass is confirmed within the inferior LEFT
breast, at anterior depth, measuring approximately 9 mm greatest
dimension. Additional tiny low-density mass is confirmed within the
inner quadrant of the LEFT breast, anterior to middle depth, best
seen on the spot compression CC projection, measuring approximately
3 mm greatest dimension.

Mammographic images were processed with CAD.

Targeted ultrasound is performed, showing an oval hypoechoic mass in
the LEFT breast at the 6 o'clock axis, subareolar, measuring 8 mm,
with partially indistinct margins, avascular, a likely correlate for
the mammographic finding.

Additional smaller hypoechoic mass is seen in the LEFT breast at the
5 o'clock axis, subareolar, measuring 3 mm, most suggestive of a
small complicated cyst with internal debris during real-time
ultrasound evaluation, likely corresponding as an incidental finding
unrelated to the additional mass seen on mammogram.

LEFT axilla was evaluated with ultrasound showing no enlarged or
morphologically abnormal lymph nodes.
IMPRESSION: 1. Hypoechoic mass in the LEFT breast at the 6 o'clock axis,
subareolar, measuring 8 mm, with partially indistinct margins, a
likely correlate for the dominant mass seen within the inferior LEFT
breast on mammogram. This mass is indeterminate and
ultrasound-guided biopsy is recommended to ensure benignity.
2. Additional probably benign complicated cyst within the LEFT
breast at the 5 o'clock axis, subareolar. If pathology result for
the LEFT breast mass at the 6 o'clock axis is benign, recommend
six-month follow-up LEFT breast ultrasound to ensure stability.
3. Additional tiny mass seen on mammogram within the inner LEFT
breast, without definite sonographic correlate. If pathology result
for the dominant LEFT breast mass is benign, recommend six-month
follow-up LEFT breast diagnostic mammogram to ensure stability.

RECOMMENDATION:
1. Ultrasound-guided biopsy for the LEFT breast mass at the 6
o'clock axis, subareolar, measuring 8 mm, likely correlate for the
dominant mass seen within the inferior LEFT breast on mammogram.
2. Postprocedure mammogram to ensure sonographic and mammographic
correspondence.
3. If pathology result for the ultrasound-guided biopsy is benign,
recommend follow-up LEFT breast diagnostic mammogram and ultrasound
to ensure stability of the additional LEFT breast findings described
above.

Ordering physician will be contacted with today's results and
patient will then be scheduled for ultrasound-guided core biopsy at
her earliest convenience.

I have discussed the findings and recommendations with the patient.
Results were also provided in writing at the conclusion of the
visit. If applicable, a reminder letter will be sent to the patient
regarding the next appointment.

BI-RADS CATEGORY  4: Suspicious.

## 2020-08-01 ENCOUNTER — Encounter: Payer: Self-pay | Admitting: Certified Nurse Midwife

## 2020-08-01 ENCOUNTER — Other Ambulatory Visit: Payer: Self-pay

## 2020-08-01 ENCOUNTER — Ambulatory Visit (INDEPENDENT_AMBULATORY_CARE_PROVIDER_SITE_OTHER): Payer: 59 | Admitting: Certified Nurse Midwife

## 2020-08-01 VITALS — BP 107/80 | HR 80 | Ht 64.0 in | Wt 229.1 lb

## 2020-08-01 DIAGNOSIS — Z1231 Encounter for screening mammogram for malignant neoplasm of breast: Secondary | ICD-10-CM | POA: Diagnosis not present

## 2020-08-01 DIAGNOSIS — Z01419 Encounter for gynecological examination (general) (routine) without abnormal findings: Secondary | ICD-10-CM | POA: Diagnosis not present

## 2020-08-01 DIAGNOSIS — Z1159 Encounter for screening for other viral diseases: Secondary | ICD-10-CM

## 2020-08-01 NOTE — Progress Notes (Signed)
GYNECOLOGY ANNUAL PREVENTATIVE CARE ENCOUNTER NOTE  History:     Katelyn Wilson is a 44 y.o. G0P0000 female here for a routine annual gynecologic exam.  Current complaints: none.   Denies abnormal vaginal bleeding, discharge, pelvic pain, problems with intercourse or other gynecologic concerns.      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 Contraception: none   Upstream - 08/01/20 0911      Pregnancy Intention Screening   Does the patient want to become pregnant in the next year? No    Does the patient's partner want to become pregnant in the next year? No    Would the patient like to discuss contraceptive options today? No          Last Pap: 8/14/2019Results were: normal with negative HPV Last mammogram 08/18/19 . Results were: abnormal  Obstetric History OB History  Gravida Para Term Preterm AB Living  0 0 0 0 0 0  SAB TAB Ectopic Multiple Live Births  0 0 0 0 0    Past Medical History:  Diagnosis Date   Acid reflux    allergic rhinitis    Anxiety    Asthma    allergy induced   Hallux abducto valgus, left    Hypertension    Obesity    Obesity (BMI 30-39.9)    Pre-diabetes    Prediabetes    Vitamin D deficiency     Past Surgical History:  Procedure Laterality Date   Barbie Banner OSTEOTOMY Left 11/29/2018   Procedure: Barbie Banner OSTEOTOMY;  Surgeon: Landis Martins, DPM;  Location: [facility];  Service: Podiatry;  Laterality: Left;   BREAST BIOPSY Left 2019   venus clip, [place] bx, fibroadenoma   BUNIONECTOMY Left 11/29/2018   Procedure: Jeralene Huff BUNIONECTOMY LEFT FOOT;  Surgeon: Landis Martins, DPM;  Location: [facility];  Service: Podiatry;  Laterality: Left;   none     WISDOM TOOTH EXTRACTION      Current Outpatient Medications  on File Prior to Visit  Medication Sig Dispense Refill   albuterol (VENTOLIN HFA) 108 (90 Base) MCG/ACT inhaler INHALE 2 PUFFS INTO THE LUNGS EVERY SIX HOURS AS NEEDED FOR WHEEZING OR SHORTNESS OF BREATH 18 g 1   Black Cohosh-SoyIsoflav-Magnol (ESTROVEN MENOPAUSE RELIEF) CAPS Take by mouth.     cetirizine (ZYRTEC) 10 MG tablet Take 10 mg by mouth as needed.       cyanocobalamin (,VITAMIN B-12,) 1000 MCG/ML injection USE ONE INJECTION WEEKLY FOR 3 WEEKS, THEN MONTHLY 10 mL 1   meloxicam (MOBIC) 7.5 MG tablet Take one table daily prn once finished with sterapred taper 30 tablet 2   Vitamin D, Ergocalciferol, (DRISDOL) 50000 units CAPS capsule Take 1 capsule (50,000 Units total) by mouth 2 (two) times a week. (Patient taking differently: Take 50,000 Units by mouth every 30 (thirty) days. ) 30 capsule 2   predniSONE (STERAPRED UNI-PAK 21 TAB) 5 MG (21) TBPK tablet Take as directed 21 tablet 0   Current Facility-Administered Medications on File Prior to Visit  Medication Dose Route Frequency Provider Last Rate Last Admin   cyanocobalamin ((VITAMIN B-12)) injection 1,000 mcg  1,000 mcg Intramuscular Once Shella Maxim, NP        Allergies  Allergen Reactions   Penicillins Rash    Amoxicillin    Social History:  reports that she has  never smoked. She has never used smokeless tobacco. She reports current alcohol use. She reports that she does not use drugs.  Family History  Problem Relation Age of Onset   Heart disease Paternal Aunt 7       sudden death   Heart disease Father        sudden cardiac death   Diabetes Father    Hypertension Father    Sleep apnea Father    Diabetes Other    Lung cancer Other    Cancer Other    Diabetes Mother    Sleep apnea Mother    Obesity Mother    Cancer Maternal Grandmother    Cancer Maternal Grandfather    Breast cancer Neg Hx     The following portions of the patient's history were reviewed and updated as appropriate:  allergies, current medications, past family history, past medical history, past social history, past surgical history and problem list.  Review of Systems Pertinent items noted in HPI and remainder of comprehensive ROS otherwise negative.  Physical Exam:  BP 107/80    Pulse 80    Ht 5\' 4"  (1.626 m)    Wt 229 lb 1.6 oz (103.9 kg)    LMP 07/19/2020 (Exact Date)    BMI 39.32 kg/m  CONSTITUTIONAL: Well-developed, well-nourished, over weight female in no acute distress.  HENT:  Normocephalic, atraumatic, External right and left ear normal. Oropharynx is clear and moist EYES: Conjunctivae and EOM are normal. Pupils are equal, round, and reactive to light. No scleral icterus.  NECK: Normal range of motion, supple, no masses.  Normal thyroid.  SKIN: Skin is warm and dry. No rash noted. Not diaphoretic. No erythema. No pallor. MUSCULOSKELETAL: Normal range of motion. No tenderness.  No cyanosis, clubbing, or edema.  2+ distal pulses. NEUROLOGIC: Alert and oriented to person, place, and time. Normal reflexes, muscle tone coordination.  PSYCHIATRIC: Normal mood and affect. Normal behavior. Normal judgment and thought content. CARDIOVASCULAR: Normal heart rate noted, regular rhythm RESPIRATORY: Clear to auscultation bilaterally. Effort and breath sounds normal, no problems with respiration noted. BREASTS: Symmetric in size. No masses, tenderness, skin changes, nipple drainage, or lymphadenopathy bilaterally ABDOMEN: Soft, no distention noted.  No tenderness, rebound or guarding.  PELVIC: Normal appearing external genitalia and urethral meatus; normal appearing vaginal mucosa and cervix.  No abnormal discharge noted.  Pap smear not done.  Normal uterine size, no other palpable masses, no uterine or adnexal tenderness.   Assessment and Plan:    1. Women's annual routine gynecological examination  - Hepatitis C Antibody - MM 3D SCREEN BREAST BILATERAL; Future - US BREAST LTD UNI LEFT INC AXILLA;  Future  2. Encounter for screening mammogram for malignant neoplasm of breast - MM 3D SCREEN BREAST BILATERAL; Future - US BREAST LTD UNI LEFT INC AXILLA; Future  3. Encounter for hepatitis C screening test for low risk patient - Hepatitis C Antibody  Pap not due Mammogram scheduled/ u/s ordered per recommendation of last mammogram Labs: hep c Refill: none Referral: none  Routine preventative health maintenance measures emphasized. Please refer to After Visit Summary for other counseling recommendations.      Philip Aspen, CNM

## 2020-08-01 NOTE — Patient Instructions (Signed)

## 2020-08-02 LAB — HEPATITIS C ANTIBODY: Hep C Virus Ab: <0.1 s/co ratio (ref 0.0–0.9)

## 2020-08-06 DIAGNOSIS — Z20822 Contact with and (suspected) exposure to covid-19: Secondary | ICD-10-CM | POA: Diagnosis not present

## 2020-08-07 ENCOUNTER — Other Ambulatory Visit: Payer: Self-pay | Admitting: Certified Nurse Midwife

## 2020-08-07 DIAGNOSIS — Z1231 Encounter for screening mammogram for malignant neoplasm of breast: Secondary | ICD-10-CM

## 2020-08-07 DIAGNOSIS — R928 Other abnormal and inconclusive findings on diagnostic imaging of breast: Secondary | ICD-10-CM

## 2020-08-07 DIAGNOSIS — N6320 Unspecified lump in the left breast, unspecified quadrant: Secondary | ICD-10-CM

## 2020-08-14 DIAGNOSIS — Z20822 Contact with and (suspected) exposure to covid-19: Secondary | ICD-10-CM | POA: Diagnosis not present

## 2020-08-14 DIAGNOSIS — Z03818 Encounter for observation for suspected exposure to other biological agents ruled out: Secondary | ICD-10-CM | POA: Diagnosis not present

## 2020-08-23 ENCOUNTER — Ambulatory Visit
Admission: RE | Admit: 2020-08-23 | Discharge: 2020-08-23 | Disposition: A | Discharge status: Home / Self Care | Payer: 59 | Source: Ambulatory Visit | Attending: Certified Nurse Midwife | Admitting: Certified Nurse Midwife

## 2020-08-23 ENCOUNTER — Other Ambulatory Visit: Payer: Self-pay

## 2020-08-23 DIAGNOSIS — R928 Other abnormal and inconclusive findings on diagnostic imaging of breast: Secondary | ICD-10-CM

## 2020-08-23 DIAGNOSIS — N6320 Unspecified lump in the left breast, unspecified quadrant: Secondary | ICD-10-CM | POA: Diagnosis not present

## 2020-08-23 DIAGNOSIS — N6323 Unspecified lump in the left breast, lower outer quadrant: Secondary | ICD-10-CM | POA: Diagnosis not present

## 2020-08-23 DIAGNOSIS — Z1231 Encounter for screening mammogram for malignant neoplasm of breast: Secondary | ICD-10-CM | POA: Diagnosis not present

## 2020-08-27 ENCOUNTER — Telehealth: Payer: 59 | Admitting: Family

## 2020-08-27 DIAGNOSIS — L249 Irritant contact dermatitis, unspecified cause: Secondary | ICD-10-CM

## 2020-08-27 MED ORDER — TRIAMCINOLONE ACETONIDE 0.5 % EX OINT: 1.0000 "application " | TOPICAL_OINTMENT | Freq: Two times a day (BID) | CUTANEOUS | 0 refills | Status: DC

## 2020-08-27 MED ORDER — PREDNISONE 10 MG (21) PO TBPK: ORAL_TABLET | ORAL | 0 refills | Status: DC

## 2020-08-27 NOTE — Progress Notes (Signed)
E Visit for Rash  We are sorry that you are not feeling well. Here is how we plan to help!  Based on what you shared with me it looks like you have contact dermatitis.  Contact dermatitis is a skin rash caused by something that touches the skin and causes irritation or inflammation.  Your skin may be red, swollen, dry, cracked, and itch.  The rash should go away in a few days but can last a few weeks.  If you get a rash, it's important to figure out what caused it so the irritant can be avoided in the future.  I have sent in a prednisone does pack and kenalog cream to your pharmacy.    *I am going to treat you as a contact dermatitis since this occurred after using the new deodorant. However, if the steroids make it worse or does not clear it up, let me know and we will treat more as yeast. A lot of times the armpits can develop a yeast/fungal rash.   HOME CARE:   Take cool showers and avoid direct sunlight.  Apply cool compress or wet dressings.  Take a bath in an oatmeal bath.  Sprinkle content of one Aveeno packet under running faucet with comfortably warm water.  Bathe for 15-20 minutes, 1-2 times daily.  Pat dry with a towel. Do not rub the rash.  Use hydrocortisone cream.  Take an antihistamine like Benadryl for widespread rashes that itch.  The adult dose of Benadryl is 25-50 mg by mouth 4 times daily.  Caution:  This type of medication may cause sleepiness.  Do not drink alcohol, drive, or operate dangerous machinery while taking antihistamines.  Do not take these medications if you have prostate enlargement.  Read package instructions thoroughly on all medications that you take.  GET HELP RIGHT AWAY IF:   Symptoms don't go away after treatment.  Severe itching that persists.  If you rash spreads or swells.  If you rash begins to smell.  If it blisters and opens or develops a yellow-brown crust.  You develop a fever.  You have a sore throat.  You become short of  breath.  MAKE SURE YOU:  Understand these instructions. Will watch your condition. Will get help right away if you are not doing well or get worse.  Thank you for choosing an e-visit. Your e-visit answers were reviewed by a board certified advanced clinical practitioner to complete your personal care plan. Depending upon the condition, your plan could have included both over the counter or prescription medications. Please review your pharmacy choice. Be sure that the pharmacy you have chosen is open so that you can pick up your prescription now.  If there is a problem you may message your provider in [facility] to have the prescription routed to another pharmacy. Your safety is important to [place]. If you have drug allergies check your prescription carefully.  For the next 24 hours, you can use MyChart to ask questions about today's visit, request a non-urgent call back, or ask for a work or school excuse from your e-visit provider. You will get an email in the next two days asking about your experience. I hope that your e-visit has been valuable and will speed your recovery.   Approximately 5 minutes was spent documenting and reviewing patient's chart.

## 2020-09-03 DIAGNOSIS — Z03818 Encounter for observation for suspected exposure to other biological agents ruled out: Secondary | ICD-10-CM | POA: Diagnosis not present

## 2020-09-03 DIAGNOSIS — Z1152 Encounter for screening for COVID-19: Secondary | ICD-10-CM | POA: Diagnosis not present

## 2020-09-06 DIAGNOSIS — Z03818 Encounter for observation for suspected exposure to other biological agents ruled out: Secondary | ICD-10-CM | POA: Diagnosis not present

## 2020-09-06 DIAGNOSIS — Z1152 Encounter for screening for COVID-19: Secondary | ICD-10-CM | POA: Diagnosis not present

## 2020-09-20 ENCOUNTER — Ambulatory Visit: Payer: 59 | Admitting: Sports Medicine

## 2020-09-20 ENCOUNTER — Ambulatory Visit (INDEPENDENT_AMBULATORY_CARE_PROVIDER_SITE_OTHER): Payer: 59

## 2020-09-20 ENCOUNTER — Other Ambulatory Visit: Payer: Self-pay

## 2020-09-20 ENCOUNTER — Encounter: Payer: Self-pay | Admitting: Sports Medicine

## 2020-09-20 DIAGNOSIS — M79671 Pain in right foot: Secondary | ICD-10-CM

## 2020-09-20 DIAGNOSIS — M21611 Bunion of right foot: Secondary | ICD-10-CM | POA: Diagnosis not present

## 2020-09-20 NOTE — Progress Notes (Signed)
Subjective: Katelyn Wilson is a 44 y.o. female patient who presents to office for evaluation of right bunion pain.  Patient reports that she did very well after left foot surgery and desires to discuss surgery for her right foot.  Patient denies any change in medical history since last encounter.  Patient Active Problem List   Diagnosis Date Noted  . Preoperative clearance 11/22/2018  . B12 deficiency 09/09/2017  . Hyperlipidemia 02/03/2017  . Shortness of breath 12/23/2016  . Obesity, diabetes, and hypertension syndrome ([place]) 12/23/2016  . Vitamin D deficiency 12/02/2016  . Cough variant asthma 12/05/2015  . Morbid obesity ([place]) 10/23/2013  . Fibroid uterus 06/22/2013  . Contraceptive management 11/16/2011  . Family history of hypertrophic cardiomyopathy 06/18/2011    Current Outpatient Medications on File Prior to Visit  Medication Sig Dispense Refill  . albuterol (VENTOLIN HFA) 108 (90 Base) MCG/ACT inhaler INHALE 2 PUFFS INTO THE LUNGS EVERY SIX HOURS AS NEEDED FOR WHEEZING OR SHORTNESS OF BREATH 18 g 1  . Black Cohosh-SoyIsoflav-Magnol (ESTROVEN MENOPAUSE RELIEF) CAPS Take by mouth.    . cetirizine (ZYRTEC) 10 MG tablet Take 10 mg by mouth as needed.      . cyanocobalamin (,VITAMIN B-12,) 1000 MCG/ML injection USE ONE INJECTION WEEKLY FOR 3 WEEKS, THEN MONTHLY 10 mL 1  . Vitamin D, Ergocalciferol, (DRISDOL) 50000 units CAPS capsule Take 1 capsule (50,000 Units total) by mouth 2 (two) times a week. (Patient taking differently: Take 50,000 Units by mouth every 30 (thirty) days. ) 30 capsule 2   Current Facility-Administered Medications on File Prior to Visit  Medication Dose Route Frequency Provider Last Rate Last Admin  . cyanocobalamin ((VITAMIN B-12)) injection 1,000 mcg  1,000 mcg Intramuscular Once Shella Maxim, NP        Allergies  Allergen Reactions  . Penicillins Rash    Amoxicillin    Objective:  General: Alert and oriented x3 in no acute distress  Dermatology: No  open lesions bilateral lower extremities, no webspace macerations, no ecchymosis bilateral, all nails x 10 are well manicured.  Surgical scar well-healed on left.  Vascular: Dorsalis Pedis and Posterior Tibial pedal pulses 2/4, Capillary Fill Time 3 seconds, (+) pedal hair growth bilateral, no edema bilateral lower extremities, Temperature gradient within normal limits.  Neurology: Gross sensation intact via light touch bilateral.  Musculoskeletal: Mild tenderness with palpation on right bunion deformity, no limitation or crepitus with range of motion, deformity reducible, tracking not trackbound, there is no 1st ray hypermobility noted bilateral. Midtarsal, Subtalar joint, and ankle joint range of motion is within normal limits. 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   Xrays  Right foot:   Impression: Intermetatarsal angle above normal limits w/ bunion and cyst at 1st met head right and pronation of hallux.       Assessment and Plan: Problem List Items Addressed This Visit    None    Visit Diagnoses    Bunion, right    -  Primary   Relevant Orders   DG Foot Complete Right   Right foot pain           -Complete examination performed -Xrays reviewed -Discussed treatement options; discussed HAV deformity;conservative and  Surgical management; risks, benefits, alternatives discussed. All patient's questions answered. -Patient opt for surgical management. Consent obtained for Bunionectomy kalish akin on right. Pre and Post op course explained. Risks, benefits, alternatives explained. No guarantees given or  implied. Surgical booking slip submitted and provided patient with Surgical packet and info for [facility] with H&P form; patient requests surgery on 11/29 or 12/4 -Patient already has knee scooter and will dispense cam boot and crutches at [facility] -Advised patient to take time from work expect 12 weeks  to make a full recovery. Patient may work from home after 2 weeks but must wait at least 4-6 weeks before attempting to go in to office  -Patient to return to office after surgery or sooner if condition worsens.  Landis Martins, DPM

## 2020-09-27 ENCOUNTER — Encounter: Payer: Self-pay | Admitting: Sports Medicine

## 2020-09-27 DIAGNOSIS — M79676 Pain in unspecified toe(s): Secondary | ICD-10-CM

## 2020-10-01 ENCOUNTER — Ambulatory Visit: Payer: 59 | Admitting: Internal Medicine

## 2020-10-17 ENCOUNTER — Telehealth: Payer: Self-pay

## 2020-10-17 NOTE — Telephone Encounter (Signed)
DOS 11/05/2020  [facility] OSTEOTOMY RT - [id number] [place] BUNIONECTOMY RT - [id number]  [facility] EFFECTIVE DATE - 12/09/2019  PLAN DEDUCTIBLE - $300.00 W/$0.00 REMAINING OUT OF POCKET - $7900.00 W/ $7360.69 REMAINING COPAY $0.00 COINSURANCE - 60%  SPOKE TO TERRY AT [facility], SHE STATED NO PRECERT IS REQUIRED FOR CPT 28310 OR 76811. CALL REF # [id number]

## 2020-10-23 ENCOUNTER — Other Ambulatory Visit: Payer: Self-pay

## 2020-10-23 ENCOUNTER — Ambulatory Visit: Payer: 59 | Admitting: Internal Medicine

## 2020-10-23 ENCOUNTER — Encounter: Payer: Self-pay | Admitting: Internal Medicine

## 2020-10-23 VITALS — BP 122/78 | HR 70 | Temp 98.3°F | Resp 14 | Ht 64.0 in | Wt 226.4 lb

## 2020-10-23 DIAGNOSIS — J45991 Cough variant asthma: Secondary | ICD-10-CM

## 2020-10-23 DIAGNOSIS — E119 Type 2 diabetes mellitus without complications: Secondary | ICD-10-CM | POA: Diagnosis not present

## 2020-10-23 DIAGNOSIS — R8290 Unspecified abnormal findings in urine: Secondary | ICD-10-CM | POA: Diagnosis not present

## 2020-10-23 DIAGNOSIS — Z01818 Encounter for other preprocedural examination: Secondary | ICD-10-CM | POA: Diagnosis not present

## 2020-10-23 NOTE — Progress Notes (Signed)
Subjective:  Patient ID: Katelyn Wilson, female    DOB: 1976/02/28  Age: 44 y.o. MRN: [id number]  CC: The primary encounter diagnosis was Preoperative clearance. Diagnoses of Diabetes mellitus without complication (HCC) and Cough variant asthma were also pertinent to this visit.  HPI Katelyn Wilson presents for preoperative clearance  This visit occurred during the SARS-CoV-2 public health emergency.  Safety protocols were in place, including screening questions prior to the visit, additional usage of staff PPE, and extensive cleaning of exam room while observing appropriate contact time as indicated for disinfecting solutions.    Patient has received both doses of the available COVID 19 vaccine without complications.  Patient continues to mask when outside of the home except when walking in yard or at safe distances from others .  Patient denies any change in mood or development of unhealthy behaviors resuting from the pandemic's restriction of activities and socialization.    Preoperative medical clearance, requested by her podiatrist for  Outpatient surgery to remove bunion on right foot . No history of adverse reaction to anesthesia .  No recent chest pain,  shoetness of breath,  Or fevers.   She has DM Type 2 which has been historically well controlled  On diet and not complicated by nephropathy , neuropathy , or PAD.  She is due for follow up labs.   Outpatient Medications Prior to Visit  Medication Sig Dispense Refill  . albuterol (VENTOLIN HFA) 108 (90 Base) MCG/ACT inhaler INHALE 2 PUFFS INTO THE LUNGS EVERY SIX HOURS AS NEEDED FOR WHEEZING OR SHORTNESS OF BREATH 18 g 1  . Black Cohosh-SoyIsoflav-Magnol (ESTROVEN MENOPAUSE RELIEF) CAPS Take by mouth.    . cetirizine (ZYRTEC) 10 MG tablet Take 10 mg by mouth as needed.      . cyanocobalamin (,VITAMIN B-12,) 1000 MCG/ML injection USE ONE INJECTION WEEKLY FOR 3 WEEKS, THEN MONTHLY 10 mL 1  . Vitamin D, Ergocalciferol, (DRISDOL) 50000  units CAPS capsule Take 1 capsule (50,000 Units total) by mouth 2 (two) times a week. (Patient taking differently: Take 50,000 Units by mouth every 30 (thirty) days. ) 30 capsule 2  . meloxicam (MOBIC) 7.5 MG tablet Take 7.5 mg by mouth daily.     Facility-Administered Medications Prior to Visit  Medication Dose Route Frequency Provider Last Rate Last Admin  . cyanocobalamin ((VITAMIN B-12)) injection 1,000 mcg  1,000 mcg Intramuscular Once Shella Maxim, NP        Review of Systems;  Patient denies headache, fevers, malaise, unintentional weight loss, skin rash, eye pain, sinus congestion and sinus pain, sore throat, dysphagia,  hemoptysis , cough, dyspnea, wheezing, chest pain, palpitations, orthopnea, edema, abdominal pain, nausea, melena, diarrhea, constipation, flank pain, dysuria, hematuria, urinary  Frequency, nocturia, numbness, tingling, seizures,  Focal weakness, Loss of consciousness,  Tremor, insomnia, depression, anxiety, and suicidal ideation.      Objective:  BP 122/78 (BP Location: Left Arm, Patient Position: Sitting, Cuff Size: Large)   Pulse 70   Temp 98.3 F (36.8 C) (Oral)   Resp 14   Ht 5\' 4"  (1.626 m)   Wt 226 lb 6.4 oz (102.7 kg)   LMP 10/11/2020   SpO2 99%   BMI 38.86 kg/m   BP Readings from Last 3 Encounters:  10/23/20 122/78  08/01/20 107/80  06/13/20 126/82    Wt Readings from Last 3 Encounters:  10/23/20 226 lb 6.4 oz (102.7 kg)  08/01/20 229 lb 1.6 oz (103.9 kg)  07/11/20 238 lb (108 kg)  General appearance: alert, cooperative and appears stated age Ears: normal TM's and external ear canals both ears Throat: lips, mucosa, and tongue normal; teeth and gums normal Neck: no adenopathy, no carotid bruit, supple, symmetrical, trachea midline and thyroid not enlarged, symmetric, no tenderness/mass/nodules Back: symmetric, no curvature. ROM normal. No CVA tenderness. Lungs: clear to auscultation bilaterally Heart: regular rate and rhythm, S1, S2  normal, no murmur, click, rub or gallop Abdomen: soft, non-tender; bowel sounds normal; no masses,  no organomegaly Pulses: 2+ and symmetric Skin: Skin color, texture, turgor normal. No rashes or lesions Lymph nodes: Cervical, supraclavicular, and axillary nodes normal.  Lab Results  Component Value Date   HGBA1C 6.7 (H) 10/23/2020   HGBA1C 6.7 (H) 06/13/2020   HGBA1C 7.0 (H) 11/25/2019    Lab Results  Component Value Date   CREATININE 0.66 10/23/2020   CREATININE 0.74 07/04/2020   CREATININE 0.77 06/13/2020    Lab Results  Component Value Date   WBC 4.0 10/23/2020   HGB 9.3 (L) 10/23/2020   HCT 30.7 (L) 10/23/2020   PLT 446 (H) 10/23/2020   GLUCOSE 128 (H) 10/23/2020   CHOL 163 06/13/2020   TRIG 98.0 06/13/2020   HDL 48.00 06/13/2020   LDLDIRECT 118 06/13/2016   LDLCALC 96 06/13/2020   ALT 10 10/23/2020   AST 12 10/23/2020   NA 134 (L) 10/23/2020   K 4.4 10/23/2020   CL 100 10/23/2020   CREATININE 0.66 10/23/2020   BUN 9 10/23/2020   CO2 28 10/23/2020   TSH 1.63 06/13/2020   INR 1.0 10/23/2020   HGBA1C 6.7 (H) 10/23/2020   MICROALBUR <0.7 07/04/2020    US BREAST LTD UNI LEFT INC AXILLA  Result Date: 08/23/2020 CLINICAL DATA:  Follow-up for probably benign left breast mass. EXAM: DIGITAL DIAGNOSTIC BILATERAL MAMMOGRAM WITH TOMO AND CAD; ULTRASOUND LEFT BREAST LIMITED COMPARISON:  Previous exams. ACR Breast Density Category b: There are scattered areas of fibroglandular density. FINDINGS: No suspicious masses or calcifications are seen in either breast. The 0.6 cm oval circumscribed mass containing a biopsy marking clip in the lower retroareolar left breast appears unchanged. Mammographic images were processed with CAD. Targeted ultrasound of the left breast was performed. The oval circumscribed near anechoic mass in the left breast at 5 o'clock subareolar measures 0.4 x 0.2 x 0.4 cm, unchanged in size in appearance. Given 2 years of stability this is considered benign.  IMPRESSION: No findings of malignancy in either breast. RECOMMENDATION: Screening mammogram in one year.(Code:SM-B-01Y) I have discussed the findings and recommendations with the patient. If applicable, a reminder letter will be sent to the patient regarding the next appointment. BI-RADS CATEGORY  2: Benign. Electronically Signed   By: Everlean Alstrom M.D.   On: 08/23/2020 16:04   MM DIAG BREAST TOMO BILATERAL  Result Date: 08/23/2020 CLINICAL DATA:  Follow-up for probably benign left breast mass. EXAM: DIGITAL DIAGNOSTIC BILATERAL MAMMOGRAM WITH TOMO AND CAD; ULTRASOUND LEFT BREAST LIMITED COMPARISON:  Previous exams. ACR Breast Density Category b: There are scattered areas of fibroglandular density. FINDINGS: No suspicious masses or calcifications are seen in either breast. The 0.6 cm oval circumscribed mass containing a biopsy marking clip in the lower retroareolar left breast appears unchanged. Mammographic images were processed with CAD. Targeted ultrasound of the left breast was performed. The oval circumscribed near anechoic mass in the left breast at 5 o'clock subareolar measures 0.4 x 0.2 x 0.4 cm, unchanged in size in appearance. Given 2 years of stability this is considered benign.  IMPRESSION: No findings of malignancy in either breast. RECOMMENDATION: Screening mammogram in one year.(Code:SM-B-01Y) I have discussed the findings and recommendations with the patient. If applicable, a reminder letter will be sent to the patient regarding the next appointment. BI-RADS CATEGORY  2: Benign. Electronically Signed   By: Everlean Alstrom M.D.   On: 08/23/2020 16:04    Assessment & Plan:   Problem List Items Addressed This Visit      Unprioritized   Preoperative clearance - Primary    Patient  is considered to be at low risk  For perioperative complications  Based on today's exam and history.  A1c, renal function  lytes,  hgb  Have been reviewed       Relevant Orders   Urinalysis, Routine w  reflex microscopic (Completed)   Comprehensive metabolic panel (Completed)   Protime-INR (Completed)   CBC with Differential/Platelet (Completed)   Hemoglobin A1c (Completed)   Diabetes mellitus without complication ([place])    Historically  well-controlled on diet alone  . Patient is reminded to schedule an annual eye exam and foot exam is normal today. Patient has no microalbuminuria.  Lab Results  Component Value Date   HGBA1C 6.7 (H) 06/13/2020   Lab Results  Component Value Date   MICROALBUR <0.7 07/04/2020         Cough variant asthma    No recent exacerbations. She is not using a maintenance inhaler.          I provided  30 minutes of  face-to-face time during this encounter reviewing patient's current problems and past surgeries, labs and imaging studies, providing counseling on the above mentioned problems , and coordination  of care .  I am having Tamina L. Petersen maintain her cetirizine, Vitamin D (Ergocalciferol), albuterol, cyanocobalamin, and Estroven Menopause Relief. We will continue to administer cyanocobalamin.  No orders of the defined types were placed in this encounter.   There are no discontinued medications.  Follow-up: No follow-ups on file.   Crecencio Mc, MD

## 2020-10-23 NOTE — Assessment & Plan Note (Signed)
No recent exacerbations. She is not using a maintenance inhaler.

## 2020-10-23 NOTE — Assessment & Plan Note (Signed)
Historically  well-controlled on diet alone  . Patient is reminded to schedule an annual eye exam and foot exam is normal today. Patient has no microalbuminuria.  Lab Results  Component Value Date   HGBA1C 6.7 (H) 06/13/2020   Lab Results  Component Value Date   MICROALBUR <0.7 07/04/2020

## 2020-10-23 NOTE — Patient Instructions (Signed)
WE will fax your forms over to Dr Cannon Kettle by Thursday   Good luck!

## 2020-10-23 NOTE — Assessment & Plan Note (Addendum)
Patient  is considered to be at low risk  For perioperative complications  Based on today's exam and history.  A1c, renal function  lytes,  hgb  Have been reviewed

## 2020-10-24 ENCOUNTER — Encounter (HOSPITAL_BASED_OUTPATIENT_CLINIC_OR_DEPARTMENT_OTHER): Payer: Self-pay | Admitting: Sports Medicine

## 2020-10-24 ENCOUNTER — Other Ambulatory Visit: Payer: Self-pay

## 2020-10-24 LAB — URINALYSIS, ROUTINE W REFLEX MICROSCOPIC
Bilirubin Urine: NEGATIVE
Hgb urine dipstick: NEGATIVE
Ketones, ur: NEGATIVE
Nitrite: NEGATIVE
RBC / HPF: NONE SEEN (ref 0–?)
Specific Gravity, Urine: 1.02 (ref 1.000–1.030)
Total Protein, Urine: NEGATIVE
Urine Glucose: NEGATIVE
Urobilinogen, UA: 0.2 (ref 0.0–1.0)
pH: 7 (ref 5.0–8.0)

## 2020-10-24 LAB — COMPREHENSIVE METABOLIC PANEL
ALT: 10 U/L (ref 0–35)
AST: 12 U/L (ref 0–37)
Albumin: 3.9 g/dL (ref 3.5–5.2)
Alkaline Phosphatase: 57 U/L (ref 39–117)
BUN: 9 mg/dL (ref 6–23)
CO2: 28 mEq/L (ref 19–32)
Calcium: 8.9 mg/dL (ref 8.4–10.5)
Chloride: 100 mEq/L (ref 96–112)
Creatinine, Ser: 0.66 mg/dL (ref 0.40–1.20)
GFR: 107.73 mL/min (ref >60.00)
Glucose, Bld: 128 mg/dL — ABNORMAL HIGH (ref 70–99)
Potassium: 4.4 mEq/L (ref 3.5–5.1)
Sodium: 134 mEq/L — ABNORMAL LOW (ref 135–145)
Total Bilirubin: 0.3 mg/dL (ref 0.2–1.2)
Total Protein: 7.3 g/dL (ref 6.0–8.3)

## 2020-10-24 LAB — PROTIME-INR
INR: 1
Prothrombin Time: 10.5 s (ref 9.0–11.5)

## 2020-10-25 DIAGNOSIS — R8290 Unspecified abnormal findings in urine: Secondary | ICD-10-CM | POA: Insufficient documentation

## 2020-10-25 LAB — CBC WITH DIFFERENTIAL/PLATELET
Absolute Monocytes: 320 cells/uL (ref 200–950)
Basophils Absolute: 52 cells/uL (ref 0–200)
Basophils Relative: 1.3 %
Eosinophils Absolute: 120 cells/uL (ref 15–500)
Eosinophils Relative: 3 %
HCT: 30.7 % — ABNORMAL LOW (ref 35.0–45.0)
Hemoglobin: 9.3 g/dL — ABNORMAL LOW (ref 11.7–15.5)
Lymphs Abs: 896 cells/uL (ref 850–3900)
MCH: 20.8 pg — ABNORMAL LOW (ref 27.0–33.0)
MCHC: 30.3 g/dL — ABNORMAL LOW (ref 32.0–36.0)
MCV: 68.7 fL — ABNORMAL LOW (ref 80.0–100.0)
MPV: 10.7 fL (ref 7.5–12.5)
Monocytes Relative: 8 %
Neutro Abs: 2612 cells/uL (ref 1500–7800)
Neutrophils Relative %: 65.3 %
Platelets: 446 10*3/uL — ABNORMAL HIGH (ref 140–400)
RBC: 4.47 10*6/uL (ref 3.80–5.10)
RDW: 17.2 % — ABNORMAL HIGH (ref 11.0–15.0)
Total Lymphocyte: 22.4 %
WBC: 4 10*3/uL (ref 3.8–10.8)

## 2020-10-25 LAB — HEMOGLOBIN A1C
Hgb A1c MFr Bld: 6.7 % of total Hgb — ABNORMAL HIGH (ref <5.7)
Mean Plasma Glucose: 146 (calc)
eAG (mmol/L): 8.1 (calc)

## 2020-10-25 NOTE — Assessment & Plan Note (Signed)
Patient will return for culture

## 2020-10-25 NOTE — Addendum Note (Signed)
Addended by: Crecencio Mc on: 10/25/2020 02:53 PM   Modules accepted: Orders

## 2020-10-25 NOTE — Progress Notes (Signed)
Labs normal,  except  Your urinalysis is abnormal and may represent infection.  please return to provide a sample for culture to decide on treatment

## 2020-10-26 ENCOUNTER — Other Ambulatory Visit: Payer: Self-pay

## 2020-10-26 ENCOUNTER — Other Ambulatory Visit: Payer: 59

## 2020-10-26 DIAGNOSIS — R8290 Unspecified abnormal findings in urine: Secondary | ICD-10-CM | POA: Diagnosis not present

## 2020-10-26 NOTE — Addendum Note (Signed)
Addended by: Tor Netters I on: 10/26/2020 02:33 PM   Modules accepted: Orders

## 2020-10-27 LAB — URINALYSIS, ROUTINE W REFLEX MICROSCOPIC
Bilirubin, UA: NEGATIVE
Glucose, UA: NEGATIVE
Ketones, UA: NEGATIVE
Nitrite, UA: NEGATIVE
Protein,UA: NEGATIVE
RBC, UA: NEGATIVE
Specific Gravity, UA: <=1.005 — AB (ref 1.005–1.030)
Urobilinogen, Ur: 0.2 mg/dL (ref 0.2–1.0)
pH, UA: 6.5 (ref 5.0–7.5)

## 2020-10-27 LAB — MICROSCOPIC EXAMINATION
Casts: NONE SEEN /lpf
RBC, Urine: NONE SEEN /hpf (ref 0–2)

## 2020-10-29 LAB — URINE CULTURE

## 2020-10-29 NOTE — Progress Notes (Signed)
There is no evidence of UTI by culture results.  Regards,   Deborra Medina, MD

## 2020-11-03 ENCOUNTER — Other Ambulatory Visit (HOSPITAL_COMMUNITY)
Admission: RE | Admit: 2020-11-03 | Discharge: 2020-11-03 | Disposition: A | Discharge status: Home / Self Care | Payer: 59 | Source: Ambulatory Visit | Attending: Sports Medicine | Admitting: Sports Medicine

## 2020-11-03 DIAGNOSIS — Z20822 Contact with and (suspected) exposure to covid-19: Secondary | ICD-10-CM | POA: Insufficient documentation

## 2020-11-03 DIAGNOSIS — Z01812 Encounter for preprocedural laboratory examination: Secondary | ICD-10-CM | POA: Insufficient documentation

## 2020-11-04 LAB — SARS CORONAVIRUS 2 (TAT 6-24 HRS): SARS Coronavirus 2: NEGATIVE

## 2020-11-05 ENCOUNTER — Ambulatory Visit (HOSPITAL_BASED_OUTPATIENT_CLINIC_OR_DEPARTMENT_OTHER)
Admission: RE | Admit: 2020-11-05 | Discharge: 2020-11-05 | Disposition: A | Discharge status: Home / Self Care | Payer: 59 | Attending: Sports Medicine | Admitting: Sports Medicine

## 2020-11-05 ENCOUNTER — Other Ambulatory Visit: Payer: Self-pay | Admitting: Sports Medicine

## 2020-11-05 ENCOUNTER — Other Ambulatory Visit: Payer: Self-pay

## 2020-11-05 ENCOUNTER — Encounter (HOSPITAL_BASED_OUTPATIENT_CLINIC_OR_DEPARTMENT_OTHER)
Admission: RE | Disposition: A | Discharge status: Home / Self Care | Payer: Self-pay | Source: Home / Self Care | Attending: Sports Medicine

## 2020-11-05 ENCOUNTER — Ambulatory Visit (HOSPITAL_BASED_OUTPATIENT_CLINIC_OR_DEPARTMENT_OTHER): Payer: 59 | Admitting: Anesthesiology

## 2020-11-05 ENCOUNTER — Encounter (HOSPITAL_BASED_OUTPATIENT_CLINIC_OR_DEPARTMENT_OTHER): Payer: Self-pay | Admitting: Sports Medicine

## 2020-11-05 ENCOUNTER — Encounter: Payer: Self-pay | Admitting: Sports Medicine

## 2020-11-05 DIAGNOSIS — M2011 Hallux valgus (acquired), right foot: Secondary | ICD-10-CM | POA: Diagnosis not present

## 2020-11-05 DIAGNOSIS — M2041 Other hammer toe(s) (acquired), right foot: Secondary | ICD-10-CM | POA: Diagnosis not present

## 2020-11-05 DIAGNOSIS — G8918 Other acute postprocedural pain: Secondary | ICD-10-CM | POA: Diagnosis not present

## 2020-11-05 DIAGNOSIS — M21611 Bunion of right foot: Secondary | ICD-10-CM | POA: Diagnosis not present

## 2020-11-05 DIAGNOSIS — I10 Essential (primary) hypertension: Secondary | ICD-10-CM | POA: Diagnosis not present

## 2020-11-05 HISTORY — PX: AIKEN OSTEOTOMY: SHX6331

## 2020-11-05 HISTORY — PX: BUNIONECTOMY: SHX129

## 2020-11-05 LAB — POCT PREGNANCY, URINE: Preg Test, Ur: NEGATIVE

## 2020-11-05 SURGERY — BUNIONECTOMY
Anesthesia: General | Site: Toe | Laterality: Right

## 2020-11-05 MED ORDER — CLINDAMYCIN PHOSPHATE 900 MG/50ML IV SOLN
INTRAVENOUS | Status: AC
  Filled 2020-11-05: qty 50

## 2020-11-05 MED ORDER — BUPIVACAINE HCL (PF) 0.5 % IJ SOLN
INTRAMUSCULAR | Status: AC
  Filled 2020-11-05: qty 30

## 2020-11-05 MED ORDER — MIDAZOLAM HCL 2 MG/2ML IJ SOLN
2.0000 mg | Freq: Once | INTRAMUSCULAR | Status: AC
  Administered 2020-11-05: 2 mg via INTRAVENOUS

## 2020-11-05 MED ORDER — LACTATED RINGERS IV SOLN: INTRAVENOUS | Status: DC

## 2020-11-05 MED ORDER — PROMETHAZINE HCL 12.5 MG PO TABS: 12.5000 mg | ORAL_TABLET | Freq: Four times a day (QID) | ORAL | 0 refills | Status: DC | PRN

## 2020-11-05 MED ORDER — MIDAZOLAM HCL 2 MG/2ML IJ SOLN
INTRAMUSCULAR | Status: AC
  Filled 2020-11-05: qty 2

## 2020-11-05 MED ORDER — ONDANSETRON HCL 4 MG/2ML IJ SOLN
INTRAMUSCULAR | Status: DC | PRN
  Administered 2020-11-05: 4 mg via INTRAVENOUS

## 2020-11-05 MED ORDER — CHLORHEXIDINE GLUCONATE CLOTH 2 % EX PADS: 6.0000 | MEDICATED_PAD | Freq: Once | CUTANEOUS | Status: DC

## 2020-11-05 MED ORDER — OXYCODONE HCL 5 MG/5ML PO SOLN: 5.0000 mg | Freq: Once | ORAL | Status: DC | PRN

## 2020-11-05 MED ORDER — DEXAMETHASONE SODIUM PHOSPHATE 10 MG/ML IJ SOLN
INTRAMUSCULAR | Status: DC | PRN
  Administered 2020-11-05: 5 mg via INTRAVENOUS

## 2020-11-05 MED ORDER — CLINDAMYCIN PHOSPHATE 900 MG/50ML IV SOLN
INTRAVENOUS | Status: DC | PRN
  Administered 2020-11-05: 900 mg via INTRAVENOUS

## 2020-11-05 MED ORDER — PROMETHAZINE HCL 25 MG/ML IJ SOLN
6.2500 mg | INTRAMUSCULAR | Status: DC | PRN
  Administered 2020-11-05: 6.25 mg via INTRAVENOUS

## 2020-11-05 MED ORDER — LIDOCAINE HCL (CARDIAC) PF 100 MG/5ML IV SOSY
PREFILLED_SYRINGE | INTRAVENOUS | Status: DC | PRN
  Administered 2020-11-05: 50 mg via INTRAVENOUS

## 2020-11-05 MED ORDER — HYDROMORPHONE HCL 1 MG/ML IJ SOLN: 0.2500 mg | INTRAMUSCULAR | Status: DC | PRN

## 2020-11-05 MED ORDER — HYDROCODONE-ACETAMINOPHEN 10-325 MG PO TABS: 1.0000 | ORAL_TABLET | Freq: Four times a day (QID) | ORAL | 0 refills | Status: DC | PRN

## 2020-11-05 MED ORDER — OXYCODONE HCL 5 MG PO TABS: 5.0000 mg | ORAL_TABLET | Freq: Once | ORAL | Status: DC | PRN

## 2020-11-05 MED ORDER — PROMETHAZINE HCL 25 MG/ML IJ SOLN
INTRAMUSCULAR | Status: AC
  Filled 2020-11-05: qty 1

## 2020-11-05 MED ORDER — PROPOFOL 10 MG/ML IV BOLUS
INTRAVENOUS | Status: DC | PRN
  Administered 2020-11-05: 200 mg via INTRAVENOUS

## 2020-11-05 MED ORDER — FENTANYL CITRATE (PF) 100 MCG/2ML IJ SOLN
INTRAMUSCULAR | Status: AC
  Filled 2020-11-05: qty 2

## 2020-11-05 MED ORDER — LACTATED RINGERS IV SOLN: INTRAVENOUS | Status: DC | PRN

## 2020-11-05 MED ORDER — DOCUSATE SODIUM 100 MG PO CAPS: 100.0000 mg | ORAL_CAPSULE | Freq: Every day | ORAL | 0 refills | Status: DC | PRN

## 2020-11-05 MED ORDER — ROPIVACAINE HCL 5 MG/ML IJ SOLN
INTRAMUSCULAR | Status: DC | PRN
  Administered 2020-11-05: 30 mL via PERINEURAL

## 2020-11-05 MED ORDER — IBUPROFEN 800 MG PO TABS: 800.0000 mg | ORAL_TABLET | Freq: Three times a day (TID) | ORAL | 0 refills | Status: DC | PRN

## 2020-11-05 MED ORDER — LIDOCAINE 2% (20 MG/ML) 5 ML SYRINGE
INTRAMUSCULAR | Status: AC
  Filled 2020-11-05: qty 5

## 2020-11-05 MED ORDER — DEXAMETHASONE SODIUM PHOSPHATE 10 MG/ML IJ SOLN
INTRAMUSCULAR | Status: AC
  Filled 2020-11-05: qty 1

## 2020-11-05 MED ORDER — MEPERIDINE HCL 25 MG/ML IJ SOLN: 6.2500 mg | INTRAMUSCULAR | Status: DC | PRN

## 2020-11-05 MED ORDER — FENTANYL CITRATE (PF) 100 MCG/2ML IJ SOLN
INTRAMUSCULAR | Status: DC | PRN
  Administered 2020-11-05: 100 ug via INTRAVENOUS

## 2020-11-05 MED ORDER — CLINDAMYCIN PHOSPHATE 900 MG/50ML IV SOLN: 900.0000 mg | INTRAVENOUS | Status: DC

## 2020-11-05 MED ORDER — AMISULPRIDE (ANTIEMETIC) 5 MG/2ML IV SOLN: 10.0000 mg | Freq: Once | INTRAVENOUS | Status: DC | PRN

## 2020-11-05 MED ORDER — FENTANYL CITRATE (PF) 100 MCG/2ML IJ SOLN
100.0000 ug | Freq: Once | INTRAMUSCULAR | Status: AC
  Administered 2020-11-05: 100 ug via INTRAVENOUS

## 2020-11-05 MED ORDER — LIDOCAINE HCL (PF) 1 % IJ SOLN
INTRAMUSCULAR | Status: AC
  Filled 2020-11-05: qty 30

## 2020-11-05 MED ORDER — PROPOFOL 10 MG/ML IV BOLUS
INTRAVENOUS | Status: AC
  Filled 2020-11-05: qty 20

## 2020-11-05 MED ORDER — SODIUM CHLORIDE (PF) 0.9 % IJ SOLN
INTRAMUSCULAR | Status: AC
  Filled 2020-11-05: qty 10

## 2020-11-05 MED ORDER — ONDANSETRON HCL 4 MG/2ML IJ SOLN
INTRAMUSCULAR | Status: AC
  Filled 2020-11-05: qty 2

## 2020-11-05 SURGICAL SUPPLY — 54 items
BIT DRILL 2 (BIT) ×1 IMPLANT
BLADE AVERAGE 25X9 (BLADE) ×2 IMPLANT
BLADE SURG 15 STRL LF DISP TIS (BLADE) ×2 IMPLANT
BLADE SURG 15 STRL SS (BLADE) ×4
BNDG CMPR 9X4 STRL LF SNTH (GAUZE/BANDAGES/DRESSINGS) ×1
BNDG ELASTIC 3X5.8 VLCR STR LF (GAUZE/BANDAGES/DRESSINGS) ×2 IMPLANT
BNDG ESMARK 4X9 LF (GAUZE/BANDAGES/DRESSINGS) ×2 IMPLANT
BNDG GAUZE ELAST 4 BULKY (GAUZE/BANDAGES/DRESSINGS) ×2 IMPLANT
CLIP EZ FIXATION 10X10X10 (Staple) ×1 IMPLANT
COUNTERSINK CANN 3 STRL (BIT) ×2
COVER BACK TABLE 60X90IN (DRAPES) ×2 IMPLANT
COVER WAND RF STERILE (DRAPES) IMPLANT
CUFF TOURN SGL QUICK 18X4 (TOURNIQUET CUFF) IMPLANT
DRAPE EXTREMITY T 121X128X90 (DISPOSABLE) ×2 IMPLANT
DRAPE IMP U-DRAPE 54X76 (DRAPES) ×2 IMPLANT
DRAPE OEC MINIVIEW 54X84 (DRAPES) ×2 IMPLANT
DRAPE SURG 17X23 STRL (DRAPES) ×2 IMPLANT
DRSG EMULSION OIL 3X3 NADH (GAUZE/BANDAGES/DRESSINGS) ×2 IMPLANT
DRSG PAD ABDOMINAL 8X10 ST (GAUZE/BANDAGES/DRESSINGS) IMPLANT
DURAPREP 26ML APPLICATOR (WOUND CARE) ×2 IMPLANT
ELECT REM PT RETURN 9FT ADLT (ELECTROSURGICAL) ×2
ELECTRODE REM PT RTRN 9FT ADLT (ELECTROSURGICAL) ×1 IMPLANT
GAUZE 4X4 16PLY RFD (DISPOSABLE) IMPLANT
GAUZE SPONGE 4X4 12PLY STRL (GAUZE/BANDAGES/DRESSINGS) ×2 IMPLANT
GLOVE BIO SURGEON STRL SZ 6.5 (GLOVE) ×2 IMPLANT
GLOVE BIOGEL PI IND STRL 6.5 (GLOVE) ×1 IMPLANT
GLOVE BIOGEL PI INDICATOR 6.5 (GLOVE) ×1
GOWN STRL REUS W/ TWL LRG LVL3 (GOWN DISPOSABLE) ×2 IMPLANT
GOWN STRL REUS W/TWL LRG LVL3 (GOWN DISPOSABLE) ×4
K-WIRE 1.2X100 (WIRE) ×2
KWIRE 1.2X100 (WIRE) IMPLANT
NDL HYPO 25X1 1.5 SAFETY (NEEDLE) ×1 IMPLANT
NEEDLE HYPO 25X1 1.5 SAFETY (NEEDLE) IMPLANT
NS IRRIG 1000ML POUR BTL (IV SOLUTION) IMPLANT
PACK BASIN DAY SURGERY FS (CUSTOM PROCEDURE TRAY) ×2 IMPLANT
PADDING CAST ABS 4INX4YD NS (CAST SUPPLIES) ×1
PADDING CAST ABS COTTON 4X4 ST (CAST SUPPLIES) ×1 IMPLANT
PENCIL SMOKE EVACUATOR (MISCELLANEOUS) ×2 IMPLANT
POST 2MM (Post) ×1 IMPLANT
SCREW CANNULATED 3.0MMX18MM (Screw) ×1 IMPLANT
SCREW COUNTERSINK CANN 3 STRL (BIT) IMPLANT
SLEEVE SCD COMPRESS KNEE MED (MISCELLANEOUS) ×2 IMPLANT
STOCKINETTE 6  STRL (DRAPES) ×1
STOCKINETTE 6 STRL (DRAPES) ×1 IMPLANT
STOCKINETTE SYNTHETIC 4 NONSTR (MISCELLANEOUS) ×2 IMPLANT
SUT ETHILON 4 0 PS 2 18 (SUTURE) IMPLANT
SUT MNCRL AB 3-0 PS2 18 (SUTURE) IMPLANT
SUT MNCRL AB 4-0 PS2 18 (SUTURE) IMPLANT
SUT MON AB 5-0 PS2 18 (SUTURE) IMPLANT
SUT VIC AB 3-0 FS2 27 (SUTURE) ×2 IMPLANT
SUT VICRYL 4-0 PS2 18IN ABS (SUTURE) ×2 IMPLANT
SYR BULB EAR ULCER 3OZ GRN STR (SYRINGE) ×2 IMPLANT
TOWEL GREEN STERILE FF (TOWEL DISPOSABLE) ×2 IMPLANT
UNDERPAD 30X36 HEAVY ABSORB (UNDERPADS AND DIAPERS) ×2 IMPLANT

## 2020-11-05 NOTE — Transfer of Care (Signed)
Immediate Anesthesia Transfer of Care Note  Patient: Katelyn Wilson  Procedure(s) Performed: RIGHT BUNIONECTOMY (Right Toe) POSSIBLE AIKEN OSTEOTOMY (Right Toe)  Patient Location: PACU  Anesthesia Type:General and Regional  Level of Consciousness: awake, alert  and oriented  Airway & Oxygen Therapy: Patient Spontanous Breathing and Patient connected to face mask oxygen  Post-op Assessment: Report given to RN and Post -op Vital signs reviewed and stable  Post vital signs: Reviewed and stable  Last Vitals:  Vitals Value Taken Time  BP    Temp    Pulse 88 11/05/20 1247  Resp 27 11/05/20 1247  SpO2 100 % 11/05/20 1247  Vitals shown include unvalidated device data.  Last Pain:  Vitals:   11/05/20 0847  TempSrc: Oral  PainSc: 0-No pain         Complications: No complications documented.

## 2020-11-05 NOTE — H&P (Signed)
Anesthesia H&P Update: History and Physical Exam reviewed; patient is OK for planned anesthetic and procedure. ? ?

## 2020-11-05 NOTE — H&P (Signed)
H&P: Podiatry   IOM:BTDH Katelyn Wilson 44 y.o. female  patient with a history of bunion pain seen on several occassions in office complaining of right big toe joint; Patient has tried multiple conservative treatments and opted for Surgical management. Patient had pre-op physical and clearance for bunionnectomy with possible 1st toe osteotomy and internal fixation completed. Patient met this AM in pre-op holding area; informed consent reviewed and signed. All questions answered. Right foot/surgical site marked.  This am patient denies any other pedal complaints. Denies Nausea/fever/vomiting/chills/night sweats/overnight events. Confirms NPO since midnight.   Patient Active Problem List   Diagnosis Date Noted  . Abnormal urinalysis 10/25/2020  . Preoperative clearance 11/22/2018  . B12 deficiency 09/09/2017  . Hyperlipidemia 02/03/2017  . Shortness of breath 12/23/2016  . Diabetes mellitus without complication ([place]) 74/16/3845  . Vitamin D deficiency 12/02/2016  . Cough variant asthma 12/05/2015  . Morbid obesity ([place]) 10/23/2013  . Fibroid uterus 06/22/2013  . Contraceptive management 11/16/2011  . Family history of hypertrophic cardiomyopathy 06/18/2011    No current facility-administered medications on file prior to encounter.   Current Outpatient Medications on File Prior to Encounter  Medication Sig Dispense Refill  . albuterol (VENTOLIN HFA) 108 (90 Base) MCG/ACT inhaler INHALE 2 PUFFS INTO THE LUNGS EVERY SIX HOURS AS NEEDED FOR WHEEZING OR SHORTNESS OF BREATH 18 g 1  . Black Cohosh-SoyIsoflav-Magnol (ESTROVEN MENOPAUSE RELIEF) CAPS Take by mouth.    . cetirizine (ZYRTEC) 10 MG tablet Take 10 mg by mouth as needed.      . cyanocobalamin (,VITAMIN B-12,) 1000 MCG/ML injection USE ONE INJECTION WEEKLY FOR 3 WEEKS, THEN MONTHLY 10 mL 1  . Vitamin D, Ergocalciferol, (DRISDOL) 50000 units CAPS capsule Take 1 capsule (50,000 Units total) by mouth 2 (two) times a week. (Patient taking  differently: Take 50,000 Units by mouth every 30 (thirty) days. ) 30 capsule 2     Allergies  Allergen Reactions  . Penicillins Rash    Amoxicillin    Social History   Socioeconomic History  . Marital status: Single    Spouse name: Not on file  . Number of children: 0  . Years of education: Not on file  . Highest education level: Not on file  Occupational History  . Occupation: Scientist, research (physical sciences): [employer]  . Occupation: Environmental health practitioner: [employer]  Tobacco Use  . Smoking status: Never Smoker  . Smokeless tobacco: Never Used  Vaping Use  . Vaping Use: Never used  Substance and Sexual Activity  . Alcohol use: Yes    Comment: occasional  . Drug use: No  . Sexual activity: Yes    Birth control/protection: Condom  Other Topics Concern  . Not on file  Social History Narrative  . Not on file   Social Determinants of Health   Financial Resource Strain:   . Difficulty of Paying Living Expenses: Not on file  Food Insecurity:   . Worried About Charity fundraiser in the Last Year: Not on file  . Ran Out of Food in the Last Year: Not on file  Transportation Needs:   . Lack of Transportation (Medical): Not on file  . Lack of Transportation (Non-Medical): Not on file  Physical Activity:   . Days of Exercise per Week: Not on file  . Minutes of Exercise per Session: Not on file  Stress:   . Feeling of Stress : Not on file  Social Connections:   .  Frequency of Communication with Friends and Family: Not on file  . Frequency of Social Gatherings with Friends and Family: Not on file  . Attends Religious Services: Not on file  . Active Member of Clubs or Organizations: Not on file  . Attends Archivist Meetings: Not on file  . Marital Status: Not on file  Intimate Partner Violence:   . Fear of Current or Ex-Partner: Not on file  . Emotionally Abused: Not on file  . Physically Abused: Not on file  . Sexually Abused: Not on file     Past Surgical History:  Procedure Laterality Date  . Barbie Banner OSTEOTOMY Left 11/29/2018   Procedure: [employer];  Surgeon: Landis Martins, DPM;  Location: [facility];  Service: Podiatry;  Laterality: Left;  . BREAST BIOPSY Left 2019   venus clip, [place] bx, fibroadenoma  . BUNIONECTOMY Left 11/29/2018   Procedure: Jeralene Huff BUNIONECTOMY LEFT FOOT;  Surgeon: Landis Martins, DPM;  Location: [facility];  Service: Podiatry;  Laterality: Left;  . none    . WISDOM TOOTH EXTRACTION      Family History  Problem Relation Age of Onset  . Heart disease Paternal Aunt 41       sudden death  . Heart disease Father        sudden cardiac death  . Diabetes Father   . Hypertension Father   . Sleep apnea Father   . Diabetes Other   . Lung cancer Other   . Cancer Other   . Diabetes Mother   . Sleep apnea Mother   . Obesity Mother   . Cancer Maternal Grandmother   . Cancer Maternal Grandfather   . Breast cancer Neg Hx      REVIEW OF SYSTEMS: Non contributory   PHYSICAL EXAMINATION:  Today's Vitals   11/05/20 0847 11/05/20 0950 11/05/20 0955 11/05/20 1000  BP: 126/81 124/67 110/67 (!) 107/56  Pulse: 77 72 69 69  Resp: _0 Temp: 98.4 F (36.9 C)     TempSrc: Oral     SpO2: 100% 100% 100% 100%  Weight: 102.3 kg     Height: _1  (1.626 m)     PainSc: 0-No pain       GENERAL: Well-developed, well-nourished, in no acute distress. Alert  and cooperative.  LOWER EXTREMITY EXAM: Dermatology: No open lesions bilateral lower extremities, no webspace macerations, no ecchymosis bilateral, all nails x 10 are well manicured.  Surgical scar well-healed on left.  Vascular: Dorsalis Pedis and Posterior Tibial pedal pulses 2/4, Capillary Fill Time 3 seconds, (+) pedal hair growth bilateral, no edema bilateral lower extremities, Temperature gradient within normal limits.  Neurology: Gross sensation intact via light touch  bilateral.  Musculoskeletal: Mild tenderness with palpation on right bunion deformity, no limitation or crepitus with range of motion, deformity reducible, tracking not trackbound, there is no 1st ray hypermobility noted bilateral. Midtarsal, Subtalar joint, and ankle joint range of motion is within normal limits. 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    XRAY, RIGHT FOOT- in chart  ASSESSMENTS:  1.Bunion, right 2. Hallux interphalangeus, right 3. Right foot pain   PLAN OF CARE: Patient seen and evaluated 1. History and physical completed 2. Patient NPO since midnight 3. Previous Imaging reviewed  4. Consent for surgery explained and obtained for bunionectomy with possible 1st toe osteotomy with internal fixation; risk and benefits explained; all  questions answered and no guarantees granted. 5. Patient to undergo above surgical procedure 6. Case discussed with patient and to meet with  Mom, Inez Catalina represented after the procedure 7. To resume all home meds post-procedure and to give prn pain meds, anti-nausea, and anti-constipation medications post op 8. Will continue to follow closely/ see post-operative in the office within 1 week.  Landis Martins, DPM

## 2020-11-05 NOTE — Anesthesia Preprocedure Evaluation (Signed)
Anesthesia Evaluation  Patient identified by MRN, date of birth, ID band Patient awake    Reviewed: Allergy & Precautions, NPO status , Patient's Chart, lab work & pertinent test results  Airway Mallampati: II  TM Distance: >3 FB Neck ROM: Full    Dental  (+) Dental Advisory Given   Pulmonary asthma ,    breath sounds clear to auscultation       Cardiovascular hypertension, Pt. on medications  Rhythm:Regular Rate:Normal     Neuro/Psych Anxiety negative neurological ROS     GI/Hepatic Neg liver ROS, GERD  ,  Endo/Other  negative endocrine ROS  Renal/GU negative Renal ROS     Musculoskeletal   Abdominal (+) + obese,   Peds  Hematology negative hematology ROS (+)   Anesthesia Other Findings   Reproductive/Obstetrics                             Anesthesia Physical  Anesthesia Plan  ASA: II  Anesthesia Plan: General   Post-op Pain Management:  Regional for Post-op pain   Induction: Intravenous  PONV Risk Score and Plan: 3 and Midazolam, Dexamethasone, Ondansetron and Treatment may vary due to age or medical condition  Airway Management Planned: LMA  Additional Equipment:   Intra-op Plan:   Post-operative Plan: Extubation in OR  Informed Consent: I have reviewed the patients History and Physical, chart, labs and discussed the procedure including the risks, benefits and alternatives for the proposed anesthesia with the patient or authorized representative who has indicated his/her understanding and acceptance.     Dental advisory given  Plan Discussed with: CRNA  Anesthesia Plan Comments:         Anesthesia Quick Evaluation

## 2020-11-05 NOTE — Discharge Instructions (Signed)
SEE INSTRUCTIONS FROM [facility] ON PAPER CHART   Post Anesthesia Home Care Instructions  Activity: Get plenty of rest for the remainder of the day. A responsible individual must stay with you for 24 hours following the procedure.  For the next 24 hours, DO NOT: -Drive a car -Paediatric nurse -Drink alcoholic beverages -Take any medication unless instructed by your physician -Make any legal decisions or sign important papers.  Meals: Start with liquid foods such as gelatin or soup. Progress to regular foods as tolerated. Avoid greasy, spicy, heavy foods. If nausea and/or vomiting occur, drink only clear liquids until the nausea and/or vomiting subsides. Call your physician if vomiting continues.  Special Instructions/Symptoms: Your throat may feel dry or sore from the anesthesia or the breathing tube placed in your throat during surgery. If this causes discomfort, gargle with warm salt water. The discomfort should disappear within 24 hours.  If you had a scopolamine patch placed behind your ear for the management of post- operative nausea and/or vomiting:  1. The medication in the patch is effective for 72 hours, after which it should be removed.  Wrap patch in a tissue and discard in the trash. Wash hands thoroughly with soap and water. 2. You may remove the patch earlier than 72 hours if you experience unpleasant side effects which may include dry mouth, dizziness or visual disturbances. 3. Avoid touching the patch. Wash your hands with soap and water after contact with the patch.

## 2020-11-05 NOTE — Anesthesia Postprocedure Evaluation (Signed)
Anesthesia Post Note  Patient: Ineze L Glauser  Procedure(s) Performed: RIGHT BUNIONECTOMY (Right Toe) POSSIBLE AIKEN OSTEOTOMY (Right Toe)     Patient location during evaluation: PACU Anesthesia Type: General Level of consciousness: awake and alert Pain management: pain level controlled Vital Signs Assessment: post-procedure vital signs reviewed and stable Respiratory status: spontaneous breathing, nonlabored ventilation and respiratory function stable Cardiovascular status: blood pressure returned to baseline and stable Postop Assessment: no apparent nausea or vomiting Anesthetic complications: no   No complications documented.  Last Vitals:  Vitals:   11/05/20 1345 11/05/20 1405  BP: 112/70 (!) 113/49  Pulse: 73 73  Resp: 20 18  Temp:  36.4 C  SpO2: 98% 99%    Last Pain:  Vitals:   11/05/20 1405  TempSrc:   PainSc: 0-No pain                 Lynda Rainwater

## 2020-11-05 NOTE — Anesthesia Procedure Notes (Signed)
Anesthesia Regional Block: Popliteal block   Pre-Anesthetic Checklist: ,, timeout performed, Correct Patient, Correct Site, Correct Laterality, Correct Procedure, Correct Position, site marked, Risks and benefits discussed,  Surgical consent,  Pre-op evaluation,  At surgeon's request and post-op pain management  Laterality: Right  Prep: chloraprep       Needles:  Injection technique: Single-shot  Needle Type: Stimiplex     Needle Length: 9cm  Needle Gauge: 21     Additional Needles:   Procedures:,,,, ultrasound used (permanent image in chart),,,,  Narrative:  Start time: 11/05/2020 9:52 AM End time: 11/05/2020 9:57 AM Injection made incrementally with aspirations every 5 mL.  Performed by: Personally  Anesthesiologist: Lynda Rainwater, MD

## 2020-11-05 NOTE — Anesthesia Procedure Notes (Signed)
Procedure Name: LMA Insertion Performed by: Kahari Critzer M, CRNA Pre-anesthesia Checklist: Patient identified, Emergency Drugs available, Suction available and Patient being monitored Patient Re-evaluated:Patient Re-evaluated prior to induction Oxygen Delivery Method: Circle system utilized Preoxygenation: Pre-oxygenation with 100% oxygen Induction Type: IV induction Ventilation: Mask ventilation without difficulty LMA: LMA inserted LMA Size: 4.0 Number of attempts: 1 Airway Equipment and Method: Bite block Placement Confirmation: positive ETCO2 Tube secured with: Tape Dental Injury: Teeth and Oropharynx as per pre-operative assessment        

## 2020-11-05 NOTE — Brief Op Note (Signed)
11/05/2020  12:57 PM  PATIENT:  Katelyn Wilson  44 y.o. female  PRE-OPERATIVE DIAGNOSIS:  RIGHT BUNION  POST-OPERATIVE DIAGNOSIS:  RIGHT BUNION  PROCEDURE:  Procedure(s) with comments: RIGHT BUNIONECTOMY (Right) - BLOCK POSSIBLE AIKEN OSTEOTOMY (Right)  SURGEON:  Surgeon(s) and Role:    Landis Martins, DPM - Primary  PHYSICIAN ASSISTANT:   ASSISTANTS: none   ANESTHESIA:   regional  EBL:  15 mL   BLOOD ADMINISTERED:none  DRAINS: none   LOCAL MEDICATIONS USED:  NONE  SPECIMEN:  No Specimen  DISPOSITION OF SPECIMEN:  N/A  COUNTS:  YES  TOURNIQUET:   Total Tourniquet Time Documented: Leg (Right) - 23114 minutes Total: Leg (Right) - 63785 minutes   DICTATION: .Note written in EPIC  PLAN OF CARE: Discharge to home after PACU  PATIENT DISPOSITION:  PACU - hemodynamically stable.   Delay start of Pharmacological VTE agent (>24hrs) due to surgical blood loss or risk of bleeding: no

## 2020-11-05 NOTE — Progress Notes (Signed)
Assisted Dr. Sabra Heck with right, ultrasound guided, popliteal block. Side rails up, monitors on throughout procedure. See vital signs in flow sheet. Tolerated Procedure well.

## 2020-11-05 NOTE — Op Note (Signed)
DATE OF SURGERY: 11-05-20  PREOPERATIVE DIAGNOSES: 1. Hallux valgus, Right foot 2. Hallux interphalangeus, Right foot  POSTOPERATIVE DIAGNOSES: 1. Hallux valgus, Right foot 2. Hallux interphalangeus, Right foot  PROCEDURES PERFORMED: 1.  Kalish bunionectomy withl first metatarsal osteotomy and internal screw fixation, Right foot 2. Akin bunionectomy,Right 1st toe with internal staple fixation.  SURGEON: Landis Martins, DPM   ASSISTANT: None  ESTIMATED BLOOD LOSS:  Minimal.   HEMOSTASIS:  Right ankle tourniquet    ANESTHESIA: TIVA/Block  INDICATIONS FOR PROCEDURE: This female patient presented with bunion pain. She has tried conservative methods such as wide shoes, accommodative padding on an outpatient basis with Dr.  Cannon Kettle all of which have provided inadequate relief. At this time, she desires attempted surgical correction. The risks versus benefits of the procedure have been discussed with the patient in detail by Dr. Cannon Kettle  and the consent is available on the chart for review.  PROCEDURE IN DETAIL: After Regional and IV was established by the Department of Anesthesia, the patient was taken to the operating room via cart and placed on the operative table in supine position and a safety strap was placed across her waist for her protection. Copious amounts of Webril were applied about the Right ankle and a pneumatic ankle tourniquet was placed over the Webril.  After adequate IV sedation was administered by the Department of Anesthesia, The foot was elevated off the table. Esmarch bandages were used to exsanguinate the Right foot. The pneumatic ankle tourniquet was elevated to 250 mmHg. The foot was lowered in the operative field and the sterile stockinet was reflected. A sterile Betadine was wiped away with a wet and dry sponge and one-two pickup was used to test anesthesia, which was found to be adequate. Attention was directed to the first metatarsophalangeal joint, which was  found to be contracted, laterally deviated, and had decreased range of motion. A #15 blade was used to make a 4 cm dorsolinear incision. A #15 blade was used to deepen the incision through the subcutaneous layer. All superficial subcutaneous vessels were ligated with electrocautery. Next, a linear capsular incision was made down the bone with a #15 blade. The capsule was elevated medially and laterally off the metatarsal head and the metatarsal head was delivered into the wound. A hypertrophic medial eminence was resected with a sagittal saw taking care not to strike the head. The medial plantar aspect of the metatarsal head had some erosive changes and eburnation. Standard lateral release was also performed as well as a lateral capsulotomy freeing the fibular sesamoid complex.  Attention was directed to 1st metatarsal head and shaft after dissection where a V shaped osteotomy with the long arm dorsally was made using a sagittal saw.  The head of the first metatarsal was translocated laterally and reduced and fixated utilizing a Stryker 3.0 x 18 mm length headless screw.  The overhanging bone shelf at the medial aspect was then rasped smooth then intraoperative fluoroscopy was obtained.  Nice correction was achieved and excellent bone to bone contact was achieved. Next, a sagittal saw was used to make a proximal cut in approximately 1 cm dorsal to the base of the proximal phalanx, leaving a lateral intact cortical hinge. A distal cut parallel with the nail base was performed and a standard proximal Akin osteotomy was done.  After the wedge bone was removed, the saw blade was reinserted and used to tether the osteotomy with counter-pressure used to close down the osteotomy. A #15 drill blade was used  to drill two converging holes on the medial aspect of the bone. A Stryker staple 10 x 10 x 10 was inserted with compression of the osteotomy site. The foot was loaded again and the toe had an excellent cosmetic  straight appearance and the range of motion of the first metatarsophalangeal joint was then improved. Next, reciprocating rasps were used to smooth all bony surfaces. Copious amounts of sterile saline was used to flush the joint. Next, a # 3-0 Vicryl was used to reapproximate the capsular periosteal tissue layer. Next, #4-0 monocryl was used to close the subcutaneous layer. #5-0 Monocryl was used to the close the skin in a simple running cuticular suture fashion.  The Steri-Strips were applied followed by standard postoperative dressing consisting of 4 x 4s,abd pad, Kerlix, coban and Ace. The pneumatic ankle tourniquet was released and immediate hyperemic flush was noted to the digits.   The patient tolerated the above anesthesia and procedure without complications. She was transported via cart to the Postanesthesia Care Unit with vital signs stable and vascular status intact to the right foot. She is to be non weightbearing with crutches or rolling knee scooter. She was given emergency contact numbers and instructions to call if problems arise. She was given prescriptions. She was discharged in stable condition. She is to follow up with Dr. Cannon Kettle in office in 1 week.   Landis Martins, DPM

## 2020-11-06 ENCOUNTER — Encounter (HOSPITAL_BASED_OUTPATIENT_CLINIC_OR_DEPARTMENT_OTHER): Payer: Self-pay | Admitting: Sports Medicine

## 2020-11-15 ENCOUNTER — Other Ambulatory Visit: Payer: Self-pay

## 2020-11-15 ENCOUNTER — Ambulatory Visit (INDEPENDENT_AMBULATORY_CARE_PROVIDER_SITE_OTHER): Payer: 59 | Admitting: Sports Medicine

## 2020-11-15 ENCOUNTER — Encounter (HOSPITAL_BASED_OUTPATIENT_CLINIC_OR_DEPARTMENT_OTHER): Payer: Self-pay | Admitting: Sports Medicine

## 2020-11-15 ENCOUNTER — Encounter: Payer: Self-pay | Admitting: Sports Medicine

## 2020-11-15 ENCOUNTER — Ambulatory Visit (INDEPENDENT_AMBULATORY_CARE_PROVIDER_SITE_OTHER): Payer: 59

## 2020-11-15 DIAGNOSIS — M21611 Bunion of right foot: Secondary | ICD-10-CM | POA: Diagnosis not present

## 2020-11-15 DIAGNOSIS — Z9889 Other specified postprocedural states: Secondary | ICD-10-CM

## 2020-11-15 DIAGNOSIS — M79671 Pain in right foot: Secondary | ICD-10-CM

## 2020-11-15 DIAGNOSIS — M2011 Hallux valgus (acquired), right foot: Secondary | ICD-10-CM

## 2020-11-15 NOTE — Progress Notes (Signed)
Subjective: Katelyn Wilson is a 44 y.o. female patient seen today in office for POV #1 (DOS 11-05-20), S/P R austin akin bunionectomy. Patient denies pain at surgical site, denies calf pain, denies headache, chest pain, shortness of breath, nausea, vomiting, fever, or chills. Patient states that she is doing well and is only taking Ibuprofen. No other issues noted.   Patient Active Problem List   Diagnosis Date Noted  . Abnormal urinalysis 10/25/2020  . Preoperative clearance 11/22/2018  . B12 deficiency 09/09/2017  . Hyperlipidemia 02/03/2017  . Shortness of breath 12/23/2016  . Diabetes mellitus without complication ([place]) 60/63/0160  . Vitamin D deficiency 12/02/2016  . Cough variant asthma 12/05/2015  . Morbid obesity ([place]) 10/23/2013  . Fibroid uterus 06/22/2013  . Contraceptive management 11/16/2011  . Family history of hypertrophic cardiomyopathy 06/18/2011    Current Outpatient Medications on File Prior to Visit  Medication Sig Dispense Refill  . albuterol (VENTOLIN HFA) 108 (90 Base) MCG/ACT inhaler INHALE 2 PUFFS INTO THE LUNGS EVERY SIX HOURS AS NEEDED FOR WHEEZING OR SHORTNESS OF BREATH 18 g 1  . Black Cohosh-SoyIsoflav-Magnol (ESTROVEN MENOPAUSE RELIEF) CAPS Take by mouth.    . cetirizine (ZYRTEC) 10 MG tablet Take 10 mg by mouth as needed.      . cyanocobalamin (,VITAMIN B-12,) 1000 MCG/ML injection USE ONE INJECTION WEEKLY FOR 3 WEEKS, THEN MONTHLY 10 mL 1  . docusate sodium (COLACE) 100 MG capsule Take 1 capsule (100 mg total) by mouth daily as needed. 30 capsule 0  . ibuprofen (ADVIL) 800 MG tablet Take 1 tablet (800 mg total) by mouth every 8 (eight) hours as needed. 30 tablet 0  . promethazine (PHENERGAN) 12.5 MG tablet Take 1 tablet (12.5 mg total) by mouth every 6 (six) hours as needed for nausea or vomiting. 30 tablet 0  . Vitamin D, Ergocalciferol, (DRISDOL) 50000 units CAPS capsule Take 1 capsule (50,000 Units total) by mouth 2 (two) times a week. (Patient taking  differently: Take 50,000 Units by mouth every 30 (thirty) days. ) 30 capsule 2   Current Facility-Administered Medications on File Prior to Visit  Medication Dose Route Frequency Provider Last Rate Last Admin  . cyanocobalamin ((VITAMIN B-12)) injection 1,000 mcg  1,000 mcg Intramuscular Once Shella Maxim, NP        Allergies  Allergen Reactions  . Penicillins Rash    Amoxicillin    Objective: There were no vitals filed for this visit.  General: No acute distress, AAOx3  Right foot: Sutures intact with no gapping or dehiscence at surgical site, mild swelling to right foot, no erythema, no warmth, no drainage, no signs of infection noted, Capillary fill time <3 seconds in all digits, gross sensation present via light touch to right foot. No pain or crepitation with range of motion right foot.  No pain with calf compression.   Post Op Xray, Right foot: Excellent alignment and position. Osteotomy site healing. Hardware intact. Soft tissue swelling within normal limits for post op status.   Assessment and Plan:  Problem List Items Addressed This Visit   None   Visit Diagnoses    Bunion, right    -  Primary   Relevant Orders   DG Foot Complete Right   Post-operative state       Right foot pain       Hallux interphalangeus, acquired, right           -Patient seen and evaluated -Xrays reviewed  -Applied dry sterile dressing to surgical site  right foot secured with ACE wrap and stockinet  -Advised patient to make sure to keep dressings clean, dry, and intact to right surgical site, removing the ACE as needed  -Advised patient to continue with CAM boot on Right foot; new boot dispensed this visit -Advised patient to limit activity to necessity  -Advised patient to ice and elevate as necessary  -Continue with PRN meds -Work excuse provided  -Will plan for possible suture removal at next office visit. In the meantime, patient to call office if any issues or problems arise.    Landis Martins, DPM

## 2020-11-20 ENCOUNTER — Encounter: Payer: Self-pay | Admitting: Sports Medicine

## 2020-11-22 ENCOUNTER — Encounter: Payer: Self-pay | Admitting: Sports Medicine

## 2020-11-22 ENCOUNTER — Ambulatory Visit (INDEPENDENT_AMBULATORY_CARE_PROVIDER_SITE_OTHER): Payer: 59 | Admitting: Sports Medicine

## 2020-11-22 ENCOUNTER — Other Ambulatory Visit: Payer: Self-pay

## 2020-11-22 ENCOUNTER — Other Ambulatory Visit: Payer: Self-pay | Admitting: Sports Medicine

## 2020-11-22 DIAGNOSIS — M21611 Bunion of right foot: Secondary | ICD-10-CM

## 2020-11-22 DIAGNOSIS — M79671 Pain in right foot: Secondary | ICD-10-CM

## 2020-11-22 DIAGNOSIS — M2011 Hallux valgus (acquired), right foot: Secondary | ICD-10-CM

## 2020-11-22 DIAGNOSIS — Z9889 Other specified postprocedural states: Secondary | ICD-10-CM

## 2020-11-22 MED ORDER — IBUPROFEN 800 MG PO TABS
800.0000 mg | ORAL_TABLET | Freq: Three times a day (TID) | ORAL | 0 refills | Status: DC | PRN
Start: 2020-11-22 — End: 2020-11-22

## 2020-11-22 NOTE — Progress Notes (Signed)
Subjective: Katelyn Wilson is a 44 y.o. female patient seen today in office for POV #2 (DOS 11-05-20), S/P R austin akin bunionectomy. Patient denies pain at surgical site, report she is doing good but would like a refill on motrin and wants to return on work on 12/27 , denies calf pain, denies headache, chest pain, shortness of breath, nausea, vomiting, fever, or chills. No other issues noted.   Patient Active Problem List   Diagnosis Date Noted  . Abnormal urinalysis 10/25/2020  . Preoperative clearance 11/22/2018  . B12 deficiency 09/09/2017  . Hyperlipidemia 02/03/2017  . Shortness of breath 12/23/2016  . Diabetes mellitus without complication ([place]) 31/54/0086  . Vitamin D deficiency 12/02/2016  . Cough variant asthma 12/05/2015  . Morbid obesity ([place]) 10/23/2013  . Fibroid uterus 06/22/2013  . Contraceptive management 11/16/2011  . Family history of hypertrophic cardiomyopathy 06/18/2011    Current Outpatient Medications on File Prior to Visit  Medication Sig Dispense Refill  . albuterol (VENTOLIN HFA) 108 (90 Base) MCG/ACT inhaler INHALE 2 PUFFS INTO THE LUNGS EVERY SIX HOURS AS NEEDED FOR WHEEZING OR SHORTNESS OF BREATH 18 g 1  . Black Cohosh-SoyIsoflav-Magnol (ESTROVEN MENOPAUSE RELIEF) CAPS Take by mouth.    . cetirizine (ZYRTEC) 10 MG tablet Take 10 mg by mouth as needed.    . cyanocobalamin (,VITAMIN B-12,) 1000 MCG/ML injection USE ONE INJECTION WEEKLY FOR 3 WEEKS, THEN MONTHLY 10 mL 1  . docusate sodium (COLACE) 100 MG capsule Take 1 capsule (100 mg total) by mouth daily as needed. 30 capsule 0  . promethazine (PHENERGAN) 12.5 MG tablet Take 1 tablet (12.5 mg total) by mouth every 6 (six) hours as needed for nausea or vomiting. 30 tablet 0  . promethazine (PHENERGAN) 25 MG tablet Take by mouth.    . Vitamin D, Ergocalciferol, (DRISDOL) 50000 units CAPS capsule Take 1 capsule (50,000 Units total) by mouth 2 (two) times a week. (Patient taking differently: Take 50,000 Units by  mouth every 30 (thirty) days.) 30 capsule 2   Current Facility-Administered Medications on File Prior to Visit  Medication Dose Route Frequency Provider Last Rate Last Admin  . cyanocobalamin ((VITAMIN B-12)) injection 1,000 mcg  1,000 mcg Intramuscular Once Shella Maxim, NP        Allergies  Allergen Reactions  . Penicillins Rash    Amoxicillin    Objective: There were no vitals filed for this visit.  General: No acute distress, AAOx3  Right foot: Sutures intact with no gapping or dehiscence at surgical site, mild swelling to right foot, no erythema, no warmth, no drainage, no signs of infection noted, Capillary fill time <3 seconds in all digits, gross sensation present via light touch to right foot. No pain or crepitation with range of motion right foot.  No pain with calf compression.    Assessment and Plan:  Problem List Items Addressed This Visit   None   Visit Diagnoses    Bunion, right    -  Primary   Hallux interphalangeus, acquired, right       Post-operative state       Right foot pain         -Patient seen and evaluated  -Sutures removed and steristrips applied -Applied dry sterile dressing to surgical site right foot secured with ACE wrap and stockinet  -May remove and shower and redress with ACE wrap as needed for edema control -Continue with CAM boot and scooter -Advised patient to limit activity to necessity  -Advised patient to  ice and elevate as necessary  -Continue with PRN meds; Refilled Motrin -Work excuse provided to return on 12/27 -Will plan for xrays and transition to weightbearing at next office visit. In the meantime, patient to call office if any issues or problems arise.   Landis Martins, DPM

## 2020-12-13 ENCOUNTER — Other Ambulatory Visit: Payer: Self-pay

## 2020-12-13 ENCOUNTER — Ambulatory Visit (INDEPENDENT_AMBULATORY_CARE_PROVIDER_SITE_OTHER): Payer: 59

## 2020-12-13 ENCOUNTER — Ambulatory Visit (INDEPENDENT_AMBULATORY_CARE_PROVIDER_SITE_OTHER): Payer: 59 | Admitting: Sports Medicine

## 2020-12-13 ENCOUNTER — Encounter: Payer: Self-pay | Admitting: Sports Medicine

## 2020-12-13 DIAGNOSIS — Z9889 Other specified postprocedural states: Secondary | ICD-10-CM

## 2020-12-13 DIAGNOSIS — M21611 Bunion of right foot: Secondary | ICD-10-CM

## 2020-12-13 DIAGNOSIS — M2011 Hallux valgus (acquired), right foot: Secondary | ICD-10-CM

## 2020-12-13 DIAGNOSIS — M79671 Pain in right foot: Secondary | ICD-10-CM

## 2020-12-13 NOTE — Progress Notes (Signed)
Subjective: Katelyn Wilson is a 45 y.o. female patient seen today in office for POV #3 (DOS 11-05-20), S/P R austin akin bunionectomy. Patient denies pain at surgical site, report she is doing good back at work from home with no issues, denies calf pain, denies headache, chest pain, shortness of breath, nausea, vomiting, fever, or chills. No other issues noted.   Patient Active Problem List   Diagnosis Date Noted  . Abnormal urinalysis 10/25/2020  . Preoperative clearance 11/22/2018  . B12 deficiency 09/09/2017  . Hyperlipidemia 02/03/2017  . Shortness of breath 12/23/2016  . Diabetes mellitus without complication ([place]) 123456  . Vitamin D deficiency 12/02/2016  . Cough variant asthma 12/05/2015  . Morbid obesity ([place]) 10/23/2013  . Fibroid uterus 06/22/2013  . Contraceptive management 11/16/2011  . Family history of hypertrophic cardiomyopathy 06/18/2011    Current Outpatient Medications on File Prior to Visit  Medication Sig Dispense Refill  . albuterol (VENTOLIN HFA) 108 (90 Base) MCG/ACT inhaler INHALE 2 PUFFS INTO THE LUNGS EVERY SIX HOURS AS NEEDED FOR WHEEZING OR SHORTNESS OF BREATH 18 g 1  . Black Cohosh-SoyIsoflav-Magnol (ESTROVEN MENOPAUSE RELIEF) CAPS Take by mouth.    . cetirizine (ZYRTEC) 10 MG tablet Take 10 mg by mouth as needed.    . cyanocobalamin (,VITAMIN B-12,) 1000 MCG/ML injection USE ONE INJECTION WEEKLY FOR 3 WEEKS, THEN MONTHLY 10 mL 1  . docusate sodium (COLACE) 100 MG capsule Take 1 capsule (100 mg total) by mouth daily as needed. 30 capsule 0  . ibuprofen (ADVIL) 800 MG tablet Take 1 tablet (800 mg total) by mouth every 8 (eight) hours as needed. 30 tablet 0  . promethazine (PHENERGAN) 12.5 MG tablet Take 1 tablet (12.5 mg total) by mouth every 6 (six) hours as needed for nausea or vomiting. 30 tablet 0  . promethazine (PHENERGAN) 25 MG tablet Take by mouth.    . Vitamin D, Ergocalciferol, (DRISDOL) 50000 units CAPS capsule Take 1 capsule (50,000 Units  total) by mouth 2 (two) times a week. (Patient taking differently: Take 50,000 Units by mouth every 30 (thirty) days.) 30 capsule 2   Current Facility-Administered Medications on File Prior to Visit  Medication Dose Route Frequency Provider Last Rate Last Admin  . cyanocobalamin ((VITAMIN B-12)) injection 1,000 mcg  1,000 mcg Intramuscular Once Shella Maxim, NP        Allergies  Allergen Reactions  . Penicillins Rash    Amoxicillin    Objective: There were no vitals filed for this visit.  General: No acute distress, AAOx3  Right foot: Sutures intact with no gapping or dehiscence at surgical site, mild swelling to right foot, no erythema, no warmth, no drainage, no signs of infection noted, Capillary fill time <3 seconds in all digits, gross sensation present via light touch to right foot. No pain or crepitation with range of motion right foot.  No pain with calf compression.    Assessment and Plan:  Problem List Items Addressed This Visit   None   Visit Diagnoses    Hallux interphalangeus, acquired, right    -  Primary   Relevant Orders   DG Foot Complete Right   Bunion, right       Post-operative state       Right foot pain         -Patient seen and evaluated  -Xrays reviewed consistent with post op status -Sutures removed -Dispensed surgigrip compression sleeve -Continue with CAM boot and now may weightbear -Advised patient to limit activity  to necessity  -Advised patient to ice and elevate as necessary  -Handicap placard provided  -Will plan for xrays and transitioning out of boot at next visit.   Asencion Islam, DPM

## 2020-12-27 DIAGNOSIS — J028 Acute pharyngitis due to other specified organisms: Secondary | ICD-10-CM

## 2020-12-27 DIAGNOSIS — B9689 Other specified bacterial agents as the cause of diseases classified elsewhere: Secondary | ICD-10-CM

## 2020-12-28 ENCOUNTER — Other Ambulatory Visit: Payer: Self-pay | Admitting: Internal Medicine

## 2020-12-28 MED ORDER — ALBUTEROL SULFATE HFA 108 (90 BASE) MCG/ACT IN AERS: INHALATION_SPRAY | RESPIRATORY_TRACT | 1 refills | Status: DC

## 2021-01-03 ENCOUNTER — Other Ambulatory Visit: Payer: Self-pay

## 2021-01-03 ENCOUNTER — Encounter: Payer: Self-pay | Admitting: Sports Medicine

## 2021-01-03 ENCOUNTER — Ambulatory Visit (INDEPENDENT_AMBULATORY_CARE_PROVIDER_SITE_OTHER): Payer: 59

## 2021-01-03 ENCOUNTER — Ambulatory Visit (INDEPENDENT_AMBULATORY_CARE_PROVIDER_SITE_OTHER): Payer: 59 | Admitting: Sports Medicine

## 2021-01-03 ENCOUNTER — Ambulatory Visit: Payer: 59

## 2021-01-03 DIAGNOSIS — M2011 Hallux valgus (acquired), right foot: Secondary | ICD-10-CM

## 2021-01-03 DIAGNOSIS — M21611 Bunion of right foot: Secondary | ICD-10-CM

## 2021-01-03 DIAGNOSIS — Z9889 Other specified postprocedural states: Secondary | ICD-10-CM

## 2021-01-03 DIAGNOSIS — M79671 Pain in right foot: Secondary | ICD-10-CM

## 2021-01-03 NOTE — Progress Notes (Signed)
Subjective: Katelyn Wilson is a 45 y.o. female patient seen today in office for POV #4 (DOS 11-05-20), S/P R austin akin bunionectomy. Patient reports that she is doing good. No pain. No constitutional symptoms.  No other issues noted.   Patient Active Problem List   Diagnosis Date Noted  . Abnormal urinalysis 10/25/2020  . Preoperative clearance 11/22/2018  . B12 deficiency 09/09/2017  . Hyperlipidemia 02/03/2017  . Shortness of breath 12/23/2016  . Diabetes mellitus without complication ([place]) 27/25/3664  . Vitamin D deficiency 12/02/2016  . Cough variant asthma 12/05/2015  . Morbid obesity ([place]) 10/23/2013  . Fibroid uterus 06/22/2013  . Contraceptive management 11/16/2011  . Family history of hypertrophic cardiomyopathy 06/18/2011    Current Outpatient Medications on File Prior to Visit  Medication Sig Dispense Refill  . albuterol (VENTOLIN HFA) 108 (90 Base) MCG/ACT inhaler INHALE 2 PUFFS INTO THE LUNGS EVERY SIX HOURS AS NEEDED FOR WHEEZING OR SHORTNESS OF BREATH 18 g 1  . Black Cohosh-SoyIsoflav-Magnol (ESTROVEN MENOPAUSE RELIEF) CAPS Take by mouth.    . cetirizine (ZYRTEC) 10 MG tablet Take 10 mg by mouth as needed.    . cyanocobalamin (,VITAMIN B-12,) 1000 MCG/ML injection USE ONE INJECTION WEEKLY FOR 3 WEEKS, THEN MONTHLY 10 mL 1  . docusate sodium (COLACE) 100 MG capsule Take 1 capsule (100 mg total) by mouth daily as needed. 30 capsule 0  . ibuprofen (ADVIL) 800 MG tablet Take 1 tablet (800 mg total) by mouth every 8 (eight) hours as needed. 30 tablet 0  . promethazine (PHENERGAN) 12.5 MG tablet Take 1 tablet (12.5 mg total) by mouth every 6 (six) hours as needed for nausea or vomiting. 30 tablet 0  . promethazine (PHENERGAN) 25 MG tablet Take by mouth.    . Vitamin D, Ergocalciferol, (DRISDOL) 50000 units CAPS capsule Take 1 capsule (50,000 Units total) by mouth 2 (two) times a week. (Patient taking differently: Take 50,000 Units by mouth every 30 (thirty) days.) 30 capsule 2    Current Facility-Administered Medications on File Prior to Visit  Medication Dose Route Frequency Provider Last Rate Last Admin  . cyanocobalamin ((VITAMIN B-12)) injection 1,000 mcg  1,000 mcg Intramuscular Once Shella Maxim, NP        Allergies  Allergen Reactions  . Penicillins Rash    Amoxicillin    Objective: There were no vitals filed for this visit.  General: No acute distress, AAOx3  Right foot: Incision healing well, mild swelling to right foot, no erythema, no warmth, no drainage, no signs of infection noted, Capillary fill time <3 seconds in all digits, gross sensation present via light touch to right foot. No pain or crepitation with range of motion right foot near normal ROM at 1st MTPJ.  No pain with calf compression.    Assessment and Plan:  Problem List Items Addressed This Visit   None   Visit Diagnoses    Hallux interphalangeus, acquired, right    -  Primary   Relevant Orders   DG Foot Complete Right (Completed)   DG Foot Complete Right (Completed)   Bunion, right       Post-operative state       Right foot pain         -Patient seen and evaluated  -Xrays reviewed consistent with post op status; hardware intact -May continue with compression sleeve for edema control -May weightbear for the next 3 weeks with surgical shoe before trying a normal shoe -Advised patient to limit activity to necessity  -  Advised patient to ice and elevate as necessary  -Motrin PRN -Will plan for post op check and adding on scar treatment and ROM at next visit.   Landis Martins, DPM

## 2021-02-07 ENCOUNTER — Other Ambulatory Visit: Payer: Self-pay

## 2021-02-07 ENCOUNTER — Encounter: Payer: Self-pay | Admitting: Sports Medicine

## 2021-02-07 ENCOUNTER — Ambulatory Visit (INDEPENDENT_AMBULATORY_CARE_PROVIDER_SITE_OTHER): Payer: 59 | Admitting: Sports Medicine

## 2021-02-07 DIAGNOSIS — M21611 Bunion of right foot: Secondary | ICD-10-CM

## 2021-02-07 DIAGNOSIS — M79671 Pain in right foot: Secondary | ICD-10-CM

## 2021-02-07 DIAGNOSIS — M2011 Hallux valgus (acquired), right foot: Secondary | ICD-10-CM

## 2021-02-07 DIAGNOSIS — Z9889 Other specified postprocedural states: Secondary | ICD-10-CM

## 2021-02-07 NOTE — Progress Notes (Signed)
Subjective: Katelyn Wilson is a 45 y.o. female patient seen today in office for POV #5 (DOS 11-05-20), S/P R austin akin bunionectomy. Patient reports that she is doing good in a normal shoe. No pain but does admit some stiffness or weakness at the toe joint. No constitutional symptoms.  No other issues noted.   Patient Active Problem List   Diagnosis Date Noted  . Abnormal urinalysis 10/25/2020  . Preoperative clearance 11/22/2018  . B12 deficiency 09/09/2017  . Hyperlipidemia 02/03/2017  . Shortness of breath 12/23/2016  . Diabetes mellitus without complication ([place]) 81/44/8185  . Vitamin D deficiency 12/02/2016  . Cough variant asthma 12/05/2015  . Morbid obesity ([place]) 10/23/2013  . Fibroid uterus 06/22/2013  . Contraceptive management 11/16/2011  . Family history of hypertrophic cardiomyopathy 06/18/2011    Current Outpatient Medications on File Prior to Visit  Medication Sig Dispense Refill  . albuterol (VENTOLIN HFA) 108 (90 Base) MCG/ACT inhaler INHALE 2 PUFFS INTO THE LUNGS EVERY SIX HOURS AS NEEDED FOR WHEEZING OR SHORTNESS OF BREATH 18 g 1  . Black Cohosh-SoyIsoflav-Magnol (ESTROVEN MENOPAUSE RELIEF) CAPS Take by mouth.    . cetirizine (ZYRTEC) 10 MG tablet Take 10 mg by mouth as needed.    . cyanocobalamin (,VITAMIN B-12,) 1000 MCG/ML injection USE ONE INJECTION WEEKLY FOR 3 WEEKS, THEN MONTHLY 10 mL 1  . docusate sodium (COLACE) 100 MG capsule Take 1 capsule (100 mg total) by mouth daily as needed. 30 capsule 0  . ibuprofen (ADVIL) 800 MG tablet Take 1 tablet (800 mg total) by mouth every 8 (eight) hours as needed. 30 tablet 0  . promethazine (PHENERGAN) 12.5 MG tablet Take 1 tablet (12.5 mg total) by mouth every 6 (six) hours as needed for nausea or vomiting. 30 tablet 0  . promethazine (PHENERGAN) 25 MG tablet Take by mouth.    . Vitamin D, Ergocalciferol, (DRISDOL) 50000 units CAPS capsule Take 1 capsule (50,000 Units total) by mouth 2 (two) times a week. (Patient taking  differently: Take 50,000 Units by mouth every 30 (thirty) days.) 30 capsule 2   Current Facility-Administered Medications on File Prior to Visit  Medication Dose Route Frequency Provider Last Rate Last Admin  . cyanocobalamin ((VITAMIN B-12)) injection 1,000 mcg  1,000 mcg Intramuscular Once Shella Maxim, NP        Allergies  Allergen Reactions  . Penicillins Rash    Amoxicillin    Objective: There were no vitals filed for this visit.  General: No acute distress, AAOx3  Right foot: Incision healing well, mild swelling to right foot, no erythema, no warmth, no drainage, no signs of infection noted, Capillary fill time <3 seconds in all digits, gross sensation present via light touch to right foot. No pain or crepitation with range of motion right foot at first MPJ there is 45 degrees of dorsiflexion 5 degrees of plantarflexion that is slightly limited as compared to last visit.  No pain with calf compression.    Assessment and Plan:  Problem List Items Addressed This Visit   None   Visit Diagnoses    Hallux interphalangeus, acquired, right    -  Primary   Bunion, right       Post-operative state       Right foot pain         -Patient seen and evaluated  -Encouraged gentle range of motion exercises -Advised patient to start using scar cream or treatments to help with any adhesions over the first MPJ on the right -  May continue with compression sleeve for edema control -May increase activities and exercises to tolerance -Advised patient to ice and elevate as necessary  -Continue with Motrin PRN -Will plan for final post op check and x-rays at next visit.   Landis Martins, DPM

## 2021-03-12 ENCOUNTER — Other Ambulatory Visit: Payer: Self-pay

## 2021-04-11 ENCOUNTER — Other Ambulatory Visit: Payer: Self-pay

## 2021-04-11 ENCOUNTER — Encounter: Payer: Self-pay | Admitting: Sports Medicine

## 2021-04-11 ENCOUNTER — Ambulatory Visit (INDEPENDENT_AMBULATORY_CARE_PROVIDER_SITE_OTHER): Payer: Self-pay

## 2021-04-11 ENCOUNTER — Ambulatory Visit: Payer: Self-pay

## 2021-04-11 ENCOUNTER — Ambulatory Visit (INDEPENDENT_AMBULATORY_CARE_PROVIDER_SITE_OTHER): Payer: Self-pay | Admitting: Sports Medicine

## 2021-04-11 DIAGNOSIS — Z9889 Other specified postprocedural states: Secondary | ICD-10-CM

## 2021-04-11 DIAGNOSIS — M2011 Hallux valgus (acquired), right foot: Secondary | ICD-10-CM

## 2021-04-11 DIAGNOSIS — M21611 Bunion of right foot: Secondary | ICD-10-CM

## 2021-04-11 DIAGNOSIS — M79671 Pain in right foot: Secondary | ICD-10-CM

## 2021-04-11 NOTE — Progress Notes (Signed)
Subjective: Katelyn Wilson is a 45 y.o. female patient seen today in office for POV #6 (DOS 11-05-20), S/P R austin akin bunionectomy. Patient reports that she get some tingling or zaps of pain at the right 1st toe. Reports some stiffness but is getting better. No pain. No constitutional symptoms.  No other issues noted.   Patient Active Problem List   Diagnosis Date Noted  . Abnormal urinalysis 10/25/2020  . Preoperative clearance 11/22/2018  . B12 deficiency 09/09/2017  . Hyperlipidemia 02/03/2017  . Shortness of breath 12/23/2016  . Diabetes mellitus without complication ([place]) 08/65/7846  . Vitamin D deficiency 12/02/2016  . Cough variant asthma 12/05/2015  . Morbid obesity ([place]) 10/23/2013  . Fibroid uterus 06/22/2013  . Contraceptive management 11/16/2011  . Family history of hypertrophic cardiomyopathy 06/18/2011    Current Outpatient Medications on File Prior to Visit  Medication Sig Dispense Refill  . albuterol (VENTOLIN HFA) 108 (90 Base) MCG/ACT inhaler INHALE 2 PUFFS INTO THE LUNGS EVERY SIX HOURS AS NEEDED FOR WHEEZING OR SHORTNESS OF BREATH 18 g 1  . Black Cohosh-SoyIsoflav-Magnol (ESTROVEN MENOPAUSE RELIEF) CAPS Take by mouth.    . cetirizine (ZYRTEC) 10 MG tablet Take 10 mg by mouth as needed.    . cyanocobalamin (,VITAMIN B-12,) 1000 MCG/ML injection USE ONE INJECTION WEEKLY FOR 3 WEEKS, THEN MONTHLY 10 mL 1  . docusate sodium (COLACE) 100 MG capsule Take 1 capsule (100 mg total) by mouth daily as needed. 30 capsule 0  . HYDROcodone-acetaminophen (NORCO) 10-325 MG tablet TAKE 1 TABLET BY MOUTH EVERY 6 HOURS AS NEEDED FOR UP TO 7 DAYS. 28 tablet 0  . ibuprofen (ADVIL) 800 MG tablet TAKE 1 TABLET (800 MG TOTAL) BY MOUTH EVERY 8 (EIGHT) HOURS AS NEEDED. 30 tablet 0  . meloxicam (MOBIC) 7.5 MG tablet TAKE ONE TABLET BY MOUTH DAILY AS NEEDED ONCE FINISHED WITH STERAPRED TAPER 30 tablet 2  . promethazine (PHENERGAN) 12.5 MG tablet Take 1 tablet (12.5 mg total) by mouth every 6  (six) hours as needed for nausea or vomiting. 30 tablet 0  . promethazine (PHENERGAN) 25 MG tablet Take by mouth.    . promethazine (PHENERGAN) 25 MG tablet TAKE 1/2 TABLET (12.5 MG) BY MOUTH EVERY 6 HOURS AS NEEDED FOR NAUSEA OR VOMITING. 15 tablet 0  . Vitamin D, Ergocalciferol, (DRISDOL) 50000 units CAPS capsule Take 1 capsule (50,000 Units total) by mouth 2 (two) times a week. (Patient taking differently: Take 50,000 Units by mouth every 30 (thirty) days.) 30 capsule 2   Current Facility-Administered Medications on File Prior to Visit  Medication Dose Route Frequency Provider Last Rate Last Admin  . cyanocobalamin ((VITAMIN B-12)) injection 1,000 mcg  1,000 mcg Intramuscular Once Shella Maxim, NP        Allergies  Allergen Reactions  . Penicillins Rash    Amoxicillin    Objective: There were no vitals filed for this visit.  General: No acute distress, AAOx3  Right foot: Incision well healed, minimal swelling to right foot, no erythema, no warmth, no drainage, no signs of infection noted, Capillary fill time <3 seconds in all digits, gross sensation present via light touch to right foot. No pain or crepitation with range of motion right foot at first MPJ there is 50 degrees of dorsiflexion 5 degrees of plantarflexion slightly.  No pain with calf compression.    Xray right foot: Hardware intact. Changes consistent with post op status.   Assessment and Plan:  Problem List Items Addressed This Visit  None   Visit Diagnoses    Hallux interphalangeus, acquired, right    -  Primary   Relevant Orders   DG Foot Complete Right   Bunion, right       Post-operative state       Right foot pain         -Patient seen and evaluated  -Xrays reviewed  -Encouraged patient to continue with gentle range of motion exercises -May increase activities to tolerance and start higher impact exercises  -Continue with good supportive shoe wear -Patient is discharged for post op care  -Return to  office as needed or sooner if problems. Landis Martins, DPM

## 2021-08-06 ENCOUNTER — Other Ambulatory Visit: Payer: Self-pay

## 2021-08-06 ENCOUNTER — Ambulatory Visit (INDEPENDENT_AMBULATORY_CARE_PROVIDER_SITE_OTHER): Payer: 59 | Admitting: Certified Nurse Midwife

## 2021-08-06 ENCOUNTER — Encounter: Payer: Self-pay | Admitting: Certified Nurse Midwife

## 2021-08-06 VITALS — BP 122/85 | HR 78 | Ht 64.0 in | Wt 233.6 lb

## 2021-08-06 DIAGNOSIS — Z01419 Encounter for gynecological examination (general) (routine) without abnormal findings: Secondary | ICD-10-CM

## 2021-08-06 DIAGNOSIS — Z1231 Encounter for screening mammogram for malignant neoplasm of breast: Secondary | ICD-10-CM | POA: Diagnosis not present

## 2021-08-06 NOTE — Progress Notes (Signed)
GYNECOLOGY ANNUAL PREVENTATIVE CARE ENCOUNTER NOTE  History:     Katelyn Wilson is a 45 y.o. G0P0000 female here for a routine annual gynecologic exam.  Current complaints: occasional night sweats.   Denies abnormal vaginal bleeding, discharge, pelvic pain, problems with intercourse or other gynecologic concerns.     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    Gynecologic History Patient's last menstrual period was 07/12/2021 (exact date). Contraception: none Last Pap: 02/18/2018. Results were: normal with negative HPV Last mammogram: 08/23/2020. Results were: normal  Obstetric History OB History  Gravida Para Term Preterm AB Living  0 0 0 0 0 0  SAB IAB Ectopic Multiple Live Births  0 0 0 0 0    Past Medical History:  Diagnosis Date   Acid reflux    allergic rhinitis    Anxiety    Asthma    allergy induced   Hallux abducto valgus, left    Hypertension    no meds at this time   Obesity    Obesity (BMI 30-39.9)    Pre-diabetes    Prediabetes    Vitamin D deficiency     Past Surgical History:  Procedure Laterality Date   Barbie Banner OSTEOTOMY Left 11/29/2018   Procedure: Barbie Banner OSTEOTOMY;  Surgeon: Landis Martins, DPM;  Location: [place] [facility];  Service: Podiatry;  Laterality: Left;   Barbie Banner OSTEOTOMY Right 11/05/2020   Procedure: [employer];  Surgeon: Landis Martins, DPM;  Location: [place];  Service: Podiatry;  Laterality: Right;   BREAST BIOPSY Left 2019   venus clip, [place] bx, fibroadenoma   BUNIONECTOMY Left 11/29/2018   Procedure: Jeralene Huff BUNIONECTOMY LEFT FOOT;  Surgeon: Landis Martins, DPM;  Location: [facility];  Service: Podiatry;  Laterality: Left;   BUNIONECTOMY Right 11/05/2020   Procedure: RIGHT BUNIONECTOMY;  Surgeon: Landis Martins, DPM;  Location: [facility];  Service:  Podiatry;  Laterality: Right;  BLOCK   none     WISDOM TOOTH EXTRACTION      Current Outpatient Medications on File Prior to Visit  Medication Sig Dispense Refill   albuterol (VENTOLIN HFA) 108 (90 Base) MCG/ACT inhaler INHALE 2 PUFFS INTO THE LUNGS EVERY SIX HOURS AS NEEDED FOR WHEEZING OR SHORTNESS OF BREATH 18 g 1   Black Cohosh-SoyIsoflav-Magnol (ESTROVEN MENOPAUSE RELIEF) CAPS Take by mouth.     cetirizine (ZYRTEC) 10 MG tablet Take 10 mg by mouth as needed.     cyanocobalamin (,VITAMIN B-12,) 1000 MCG/ML injection USE ONE INJECTION WEEKLY FOR 3 WEEKS, THEN MONTHLY 10 mL 1   ibuprofen (ADVIL) 800 MG tablet TAKE 1 TABLET (800 MG TOTAL) BY MOUTH EVERY 8 (EIGHT) HOURS AS NEEDED. 30 tablet 0   Vitamin D, Ergocalciferol, (DRISDOL) 50000 units CAPS capsule Take 1 capsule (50,000 Units total) by mouth 2 (two) times a week. (Patient not taking: Reported on 08/06/2021) 30 capsule 2   Current Facility-Administered Medications on File Prior to Visit  Medication Dose Route Frequency Provider Last Rate Last Admin   cyanocobalamin ((VITAMIN B-12)) injection 1,000 mcg  1,000 mcg Intramuscular Once Shella Maxim, NP        Allergies  Allergen Reactions   Penicillins Rash    Amoxicillin    Social History:  reports that she has never smoked. She has never used smokeless tobacco. She reports current alcohol use. She reports that she does not use  drugs.  Family History  Problem Relation Age of Onset   Heart disease Paternal Aunt 52       sudden death   Heart disease Father        sudden cardiac death   Diabetes Father    Hypertension Father    Sleep apnea Father    Diabetes Other    Lung cancer Other    Cancer Other    Diabetes Mother    Sleep apnea Mother    Obesity Mother    Cancer Maternal Grandmother    Cancer Maternal Grandfather    Breast cancer Neg Hx     The following portions of the patient's history were reviewed and updated as appropriate: allergies, current medications,  past family history, past medical history, past social history, past surgical history and problem list.  Review of Systems Pertinent items noted in HPI and remainder of comprehensive ROS otherwise negative.  Physical Exam:  BP 122/85   Pulse 78   Ht '5\' 4"'$  (1.626 m)   Wt 233 lb 9.6 oz (106 kg)   LMP 07/12/2021 (Exact Date)   BMI 40.10 kg/m  CONSTITUTIONAL: Well-developed, well-nourished , obesefemale in no acute distress.  HENT:  Normocephalic, atraumatic, External right and left ear normal. Oropharynx is clear and moist EYES: Conjunctivae and EOM are normal. Pupils are equal, round, and reactive to light. No scleral icterus.  NECK: Normal range of motion, supple, no masses.  Normal thyroid.  SKIN: Skin is warm and dry. No rash noted. Not diaphoretic. No erythema. No pallor. MUSCULOSKELETAL: Normal range of motion. No tenderness.  No cyanosis, clubbing, or edema.  2+ distal pulses. NEUROLOGIC: Alert and oriented to person, place, and time. Normal reflexes, muscle tone coordination.  PSYCHIATRIC: Normal mood and affect. Normal behavior. Normal judgment and thought content. CARDIOVASCULAR: Normal heart rate noted, regular rhythm RESPIRATORY: Clear to auscultation bilaterally. Effort and breath sounds normal, no problems with respiration noted. BREASTS: Symmetric in size. No masses, tenderness, skin changes, nipple drainage, or lymphadenopathy bilaterally.  ABDOMEN: Soft, no distention noted.  No tenderness, rebound or guarding.  PELVIC: Normal appearing external genitalia and urethral meatus; normal appearing vaginal mucosa and cervix.  No abnormal discharge noted.  Pap smear not due.  Normal uterine size, no other palpable masses, no uterine or adnexal tenderness.  .   Assessment and Plan:    1. Well woman exam with routine gynecological exam    Pap: not due Mammogram : ordered Labs: none due Refills: none Referral: none Having some mild perimenopausal symptoms. Self help measures  reviewed.  Routine preventative health maintenance measures emphasized. Please refer to After Visit Summary for other counseling recommendations.      Philip Aspen, CNM [facility]

## 2021-08-28 ENCOUNTER — Ambulatory Visit
Admission: RE | Admit: 2021-08-28 | Discharge: 2021-08-28 | Disposition: A | Discharge status: Home / Self Care | Payer: No Typology Code available for payment source | Source: Ambulatory Visit | Attending: Certified Nurse Midwife | Admitting: Certified Nurse Midwife

## 2021-08-28 ENCOUNTER — Other Ambulatory Visit: Payer: Self-pay

## 2021-08-28 DIAGNOSIS — Z01419 Encounter for gynecological examination (general) (routine) without abnormal findings: Secondary | ICD-10-CM | POA: Diagnosis present

## 2021-08-28 DIAGNOSIS — Z1231 Encounter for screening mammogram for malignant neoplasm of breast: Secondary | ICD-10-CM | POA: Insufficient documentation

## 2021-09-24 ENCOUNTER — Encounter: Payer: Self-pay | Admitting: Internal Medicine

## 2021-09-24 ENCOUNTER — Other Ambulatory Visit: Payer: Self-pay

## 2021-09-24 ENCOUNTER — Ambulatory Visit (INDEPENDENT_AMBULATORY_CARE_PROVIDER_SITE_OTHER): Payer: No Typology Code available for payment source | Admitting: Internal Medicine

## 2021-09-24 VITALS — BP 112/80 | HR 103 | Temp 96.1°F | Ht 64.0 in | Wt 234.0 lb

## 2021-09-24 DIAGNOSIS — E559 Vitamin D deficiency, unspecified: Secondary | ICD-10-CM

## 2021-09-24 DIAGNOSIS — Z9889 Other specified postprocedural states: Secondary | ICD-10-CM

## 2021-09-24 DIAGNOSIS — E538 Deficiency of other specified B group vitamins: Secondary | ICD-10-CM

## 2021-09-24 DIAGNOSIS — E119 Type 2 diabetes mellitus without complications: Secondary | ICD-10-CM | POA: Diagnosis not present

## 2021-09-24 DIAGNOSIS — Z23 Encounter for immunization: Secondary | ICD-10-CM | POA: Diagnosis not present

## 2021-09-24 MED ORDER — TIRZEPATIDE 2.5 MG/0.5ML ~~LOC~~ SOAJ: 2.5000 mg | SUBCUTANEOUS | 2 refills | Status: DC

## 2021-09-24 NOTE — Progress Notes (Signed)
Subjective:  Patient ID: Katelyn Wilson, female    DOB: 1976-05-13  Age: 45 y.o. MRN: [id number]  CC: The primary encounter diagnosis was B12 deficiency. Diagnoses of Vitamin D deficiency and Diabetes mellitus without complication ([place]) were also pertinent to this visit.  HPI Katelyn Wilson presents for  Chief Complaint  Patient presents with   Follow-up    Follow up diabetes, and hypertension    This visit occurred during the SARS-CoV-2 public health emergency.  Safety protocols were in place, including screening questions prior to the visit, additional usage of staff PPE, and extensive cleaning of exam room while observing appropriate contact time as indicated for disinfecting solutions.    T2DM:  She  feels generally well,   is t  exercising regularly and  trying to lose weight. Checking  blood sugars less than once daily at variable times, usually only if she feels she may be having a hypoglycemic event. .  BS have been under 130 fasting and < 150 post prandially.  Denies any recent hypoglyemic events.  Taking   medications as directed. Following a carbohydrate modified diet 6 days per week. Denies numbness, burning and tingling of extremities. Appetite is good.    Non fasting labs were drawn  Taking b12 oral supplements Vitamin D weekly .  Forgot a few weeks in a row .      Outpatient Medications Prior to Visit  Medication Sig Dispense Refill   albuterol (VENTOLIN HFA) 108 (90 Base) MCG/ACT inhaler INHALE 2 PUFFS INTO THE LUNGS EVERY SIX HOURS AS NEEDED FOR WHEEZING OR SHORTNESS OF BREATH 18 g 1   Black Cohosh-SoyIsoflav-Magnol (ESTROVEN MENOPAUSE RELIEF) CAPS Take by mouth.     cetirizine (ZYRTEC) 10 MG tablet Take 10 mg by mouth as needed.     Cyanocobalamin (VITAMIN B-12) 1000 MCG SUBL Place 1,000 mcg under the tongue daily.     Vitamin D, Ergocalciferol, (DRISDOL) 50000 units CAPS capsule Take 1 capsule (50,000 Units total) by mouth 2 (two) times a week. 30 capsule 2    cyanocobalamin (,VITAMIN B-12,) 1000 MCG/ML injection USE ONE INJECTION WEEKLY FOR 3 WEEKS, THEN MONTHLY (Patient not taking: Reported on 09/24/2021) 10 mL 1   ibuprofen (ADVIL) 800 MG tablet TAKE 1 TABLET (800 MG TOTAL) BY MOUTH EVERY 8 (EIGHT) HOURS AS NEEDED. (Patient not taking: Reported on 09/24/2021) 30 tablet 0   Facility-Administered Medications Prior to Visit  Medication Dose Route Frequency Provider Last Rate Last Admin   cyanocobalamin ((VITAMIN B-12)) injection 1,000 mcg  1,000 mcg Intramuscular Once Shella Maxim, NP        Review of Systems;  Patient denies headache, fevers, malaise, unintentional weight loss, skin rash, eye pain, sinus congestion and sinus pain, sore throat, dysphagia,  hemoptysis , cough, dyspnea, wheezing, chest pain, palpitations, orthopnea, edema, abdominal pain, nausea, melena, diarrhea, constipation, flank pain, dysuria, hematuria, urinary  Frequency, nocturia, numbness, tingling, seizures,  Focal weakness, Loss of consciousness,  Tremor, insomnia, depression, anxiety, and suicidal ideation.      Objective:  BP 112/80 (BP Location: Left Arm, Patient Position: Sitting, Cuff Size: Large)   Pulse (!) 103   Temp (!) 96.1 F (35.6 C) (Temporal)   Ht 5\' 4"  (1.626 m)   Wt 234 lb (106.1 kg)   SpO2 98%   BMI 40.17 kg/m   BP Readings from Last 3 Encounters:  09/24/21 112/80  08/06/21 122/85  11/05/20 (!) 113/49    Wt Readings from Last 3 Encounters:  09/24/21 234  lb (106.1 kg)  08/06/21 233 lb 9.6 oz (106 kg)  11/05/20 225 lb 8.5 oz (102.3 kg)    General appearance: alert, cooperative and appears stated age Ears: normal TM's and external ear canals both ears Throat: lips, mucosa, and tongue normal; teeth and gums normal Neck: no adenopathy, no carotid bruit, supple, symmetrical, trachea midline and thyroid not enlarged, symmetric, no tenderness/mass/nodules Back: symmetric, no curvature. ROM normal. No CVA tenderness. Lungs: clear to auscultation  bilaterally Heart: regular rate and rhythm, S1, S2 normal, no murmur, click, rub or gallop Abdomen: soft, non-tender; bowel sounds normal; no masses,  no organomegaly Pulses: 2+ and symmetric Skin: Skin color, texture, turgor normal. No rashes or lesions Lymph nodes: Cervical, supraclavicular, and axillary nodes normal.  Lab Results  Component Value Date   HGBA1C 6.7 (H) 10/23/2020   HGBA1C 6.7 (H) 06/13/2020   HGBA1C 7.0 (H) 11/25/2019    Lab Results  Component Value Date   CREATININE 0.66 10/23/2020   CREATININE 0.74 07/04/2020   CREATININE 0.77 06/13/2020    Lab Results  Component Value Date   WBC 4.0 10/23/2020   HGB 9.3 (L) 10/23/2020   HCT 30.7 (L) 10/23/2020   PLT 446 (H) 10/23/2020   GLUCOSE 128 (H) 10/23/2020   CHOL 163 06/13/2020   TRIG 98.0 06/13/2020   HDL 48.00 06/13/2020   LDLDIRECT 118 06/13/2016   LDLCALC 96 06/13/2020   ALT 10 10/23/2020   AST 12 10/23/2020   NA 134 (L) 10/23/2020   K 4.4 10/23/2020   CL 100 10/23/2020   CREATININE 0.66 10/23/2020   BUN 9 10/23/2020   CO2 28 10/23/2020   TSH 1.63 06/13/2020   INR 1.0 10/23/2020   HGBA1C 6.7 (H) 10/23/2020   MICROALBUR <0.7 07/04/2020    MM 3D SCREEN BREAST BILATERAL  Result Date: 09/03/2021 CLINICAL DATA:  Screening. EXAM: DIGITAL SCREENING BILATERAL MAMMOGRAM WITH TOMOSYNTHESIS AND CAD TECHNIQUE: Bilateral screening digital craniocaudal and mediolateral oblique mammograms were obtained. Bilateral screening digital breast tomosynthesis was performed. The images were evaluated with computer-aided detection. COMPARISON:  Previous exam(s). ACR Breast Density Category b: There are scattered areas of fibroglandular density. FINDINGS: There are no findings suspicious for malignancy. IMPRESSION: No mammographic evidence of malignancy. A result letter of this screening mammogram will be mailed directly to the patient. RECOMMENDATION: Screening mammogram in one year. (Code:SM-B-01Y) BI-RADS CATEGORY  1:  Negative. Electronically Signed   By: Lajean Manes M.D.   On: 09/03/2021 09:36   Assessment & Plan:   Problem List Items Addressed This Visit       Unprioritized   Diabetes mellitus without complication ([place])   Relevant Medications   tirzepatide (MOUNJARO) 2.5 MG/0.5ML Pen   Other Relevant Orders   Hemoglobin A1c   Comprehensive metabolic panel   Lipid panel   Microalbumin / creatinine urine ratio   B12 deficiency - Primary   Relevant Orders   CBC with Differential/Platelet   Vitamin B12   Vitamin D deficiency   Relevant Orders   VITAMIN D 25 Hydroxy (Vit-D Deficiency, Fractures)    I have discontinued Cesar L. Ebey's cyanocobalamin and ibuprofen. I am also having her start on tirzepatide. Additionally, I am having her maintain her cetirizine, Vitamin D (Ergocalciferol), Estroven Menopause Relief, albuterol, and Vitamin B-12. We will continue to administer cyanocobalamin.  Meds ordered this encounter  Medications   tirzepatide (MOUNJARO) 2.5 MG/0.5ML Pen    Sig: Inject 2.5 mg into the skin once a week.    Dispense:  2  mL    Refill:  2    Medications Discontinued During This Encounter  Medication Reason   cyanocobalamin (,VITAMIN B-12,) 1000 MCG/ML injection Completed Course   ibuprofen (ADVIL) 800 MG tablet Completed Course    Follow-up: No follow-ups on file.   Crecencio Mc, MD

## 2021-09-24 NOTE — Patient Instructions (Addendum)
  I  am prescribing a medication to help you lose weight called Mounjaro.   You can read about it and obtain the $25 copay card on their website: [email or web address]  Katelyn Wilson is a medication that is taken as a weekly subcutaneous injection. It is not insulin.  It  causes your pancreas to increase its  own insulin secretion  And also slows down the emptying of your stomach,  So it decreases your appetite and helps you lose weight.  The dose for the first 4 weekly doses is 2.5 mg .  You may have mild nausea on the first or second day but this should resolve.  If not  ,  stop the medication.   As long as you are losing weight,  you can continue the dose you are on .  Only increase the dose to  5.0 mg  after 4 weeks if your weight has plateaued.  Let me know when you need a refill and what dose you are taking.

## 2021-09-25 DIAGNOSIS — Z9889 Other specified postprocedural states: Secondary | ICD-10-CM | POA: Insufficient documentation

## 2021-09-25 LAB — LIPID PANEL
Cholesterol: 152 mg/dL (ref 0–200)
HDL: 46.8 mg/dL (ref >39.00)
LDL Cholesterol: 87 mg/dL (ref 0–99)
NonHDL: 105.44
Total CHOL/HDL Ratio: 3
Triglycerides: 90 mg/dL (ref 0.0–149.0)
VLDL: 18 mg/dL (ref 0.0–40.0)

## 2021-09-25 LAB — CBC WITH DIFFERENTIAL/PLATELET
Basophils Absolute: 0 10*3/uL (ref 0.0–0.1)
Basophils Relative: 0.8 % (ref 0.0–3.0)
Eosinophils Absolute: 0.2 10*3/uL (ref 0.0–0.7)
Eosinophils Relative: 3 % (ref 0.0–5.0)
HCT: 28.2 % — ABNORMAL LOW (ref 36.0–46.0)
Hemoglobin: 8.6 g/dL — ABNORMAL LOW (ref 12.0–15.0)
Lymphocytes Relative: 22.7 % (ref 12.0–46.0)
Lymphs Abs: 1.2 10*3/uL (ref 0.7–4.0)
MCHC: 30.6 g/dL (ref 30.0–36.0)
MCV: 66.3 fl — ABNORMAL LOW (ref 78.0–100.0)
Monocytes Absolute: 0.4 10*3/uL (ref 0.1–1.0)
Monocytes Relative: 6.9 % (ref 3.0–12.0)
Neutro Abs: 3.4 10*3/uL (ref 1.4–7.7)
Neutrophils Relative %: 66.6 % (ref 43.0–77.0)
Platelets: 412 10*3/uL — ABNORMAL HIGH (ref 150.0–400.0)
RBC: 4.25 Mil/uL (ref 3.87–5.11)
RDW: 18.5 % — ABNORMAL HIGH (ref 11.5–15.5)
WBC: 5.1 10*3/uL (ref 4.0–10.5)

## 2021-09-25 LAB — COMPREHENSIVE METABOLIC PANEL
ALT: 9 U/L (ref 0–35)
AST: 11 U/L (ref 0–37)
Albumin: 4 g/dL (ref 3.5–5.2)
Alkaline Phosphatase: 54 U/L (ref 39–117)
BUN: 11 mg/dL (ref 6–23)
CO2: 24 mEq/L (ref 19–32)
Calcium: 8.7 mg/dL (ref 8.4–10.5)
Chloride: 104 mEq/L (ref 96–112)
Creatinine, Ser: 0.7 mg/dL (ref 0.40–1.20)
GFR: 105.53 mL/min (ref >60.00)
Glucose, Bld: 119 mg/dL — ABNORMAL HIGH (ref 70–99)
Potassium: 4.1 mEq/L (ref 3.5–5.1)
Sodium: 135 mEq/L (ref 135–145)
Total Bilirubin: 0.2 mg/dL (ref 0.2–1.2)
Total Protein: 7.5 g/dL (ref 6.0–8.3)

## 2021-09-25 LAB — MICROALBUMIN / CREATININE URINE RATIO
Creatinine,U: 23.2 mg/dL
Microalb Creat Ratio: 3 mg/g (ref 0.0–30.0)
Microalb, Ur: <0.7 mg/dL (ref 0.0–1.9)

## 2021-09-25 LAB — VITAMIN B12: Vitamin B-12: 227 pg/mL (ref 211–911)

## 2021-09-25 LAB — HEMOGLOBIN A1C: Hgb A1c MFr Bld: 7 % — ABNORMAL HIGH (ref 4.6–6.5)

## 2021-09-25 LAB — VITAMIN D 25 HYDROXY (VIT D DEFICIENCY, FRACTURES): VITD: 47.3 ng/mL (ref 30.00–100.00)

## 2021-09-25 NOTE — Assessment & Plan Note (Signed)
Historically  well-controlled on diet alone  . Patient is reminded to schedule an annual eye exam and foot exam is normal today. Patient has no microalbuminuria.  Lab Results  Component Value Date   HGBA1C 7.0 (H) 09/24/2021   Lab Results  Component Value Date   MICROALBUR <0.7 09/24/2021

## 2021-09-25 NOTE — Assessment & Plan Note (Signed)
S/p bilateral surgeries.  Most recently this summer.  She has not resumed exercise due to non weight bearing status

## 2021-09-25 NOTE — Assessment & Plan Note (Signed)
With type 2 DM .  Discussed use of mounjaro to restrict appetite .

## 2021-09-26 ENCOUNTER — Other Ambulatory Visit: Payer: Self-pay | Admitting: Internal Medicine

## 2021-09-26 DIAGNOSIS — D649 Anemia, unspecified: Secondary | ICD-10-CM

## 2021-09-30 NOTE — Telephone Encounter (Signed)
See result note.  

## 2021-10-01 ENCOUNTER — Telehealth: Payer: Self-pay

## 2021-10-01 MED ORDER — CYANOCOBALAMIN 1000 MCG/ML IJ SOLN: INTRAMUSCULAR | 0 refills | Status: DC

## 2021-10-01 MED ORDER — "SYRINGE/NEEDLE (DISP) 25G X 1"" 3 ML MISC": 0 refills | Status: AC

## 2021-10-01 NOTE — Telephone Encounter (Signed)
B12 vial and syringes ordered for home injection

## 2021-10-14 ENCOUNTER — Other Ambulatory Visit: Payer: Self-pay

## 2021-10-14 ENCOUNTER — Other Ambulatory Visit (INDEPENDENT_AMBULATORY_CARE_PROVIDER_SITE_OTHER): Payer: No Typology Code available for payment source

## 2021-10-14 DIAGNOSIS — D649 Anemia, unspecified: Secondary | ICD-10-CM | POA: Diagnosis not present

## 2021-10-17 LAB — IRON,TIBC AND FERRITIN PANEL
%SAT: 5 % (calc) — ABNORMAL LOW (ref 16–45)
Ferritin: 4 ng/mL — ABNORMAL LOW (ref 16–232)
Iron: 22 ug/dL — ABNORMAL LOW (ref 40–190)
TIBC: 436 mcg/dL (calc) (ref 250–450)

## 2021-10-17 LAB — INTRINSIC FACTOR ANTIBODIES: Intrinsic Factor: POSITIVE — AB

## 2021-11-22 ENCOUNTER — Other Ambulatory Visit: Payer: Self-pay | Admitting: Internal Medicine

## 2021-11-25 MED ORDER — CYANOCOBALAMIN 1000 MCG/ML IJ SOLN: INTRAMUSCULAR | 0 refills | Status: DC

## 2021-12-30 ENCOUNTER — Other Ambulatory Visit: Payer: Self-pay

## 2021-12-30 ENCOUNTER — Ambulatory Visit (INDEPENDENT_AMBULATORY_CARE_PROVIDER_SITE_OTHER): Payer: No Typology Code available for payment source | Admitting: Internal Medicine

## 2021-12-30 ENCOUNTER — Encounter: Payer: Self-pay | Admitting: Internal Medicine

## 2021-12-30 VITALS — BP 130/80 | HR 92 | Temp 98.1°F | Ht 64.0 in | Wt 228.0 lb

## 2021-12-30 DIAGNOSIS — D510 Vitamin B12 deficiency anemia due to intrinsic factor deficiency: Secondary | ICD-10-CM | POA: Diagnosis not present

## 2021-12-30 DIAGNOSIS — J45991 Cough variant asthma: Secondary | ICD-10-CM | POA: Diagnosis not present

## 2021-12-30 DIAGNOSIS — Z8249 Family history of ischemic heart disease and other diseases of the circulatory system: Secondary | ICD-10-CM | POA: Diagnosis not present

## 2021-12-30 DIAGNOSIS — E1169 Type 2 diabetes mellitus with other specified complication: Secondary | ICD-10-CM | POA: Insufficient documentation

## 2021-12-30 NOTE — Assessment & Plan Note (Addendum)
Genetic testing and ECHO via EP consultation with Dr Caryl Comes in 2012 were both negative for HOCM .  On today's exam, she had a Grade 2 systolic murmur  that was non radiating. I have ordered and reviewed a 12 lead EKG , compared it to the previous one done in 2019 , and find that there are no acute changes and patient is in sinus rhythm. She does not meet criteria for LVH .    She is exercising regularly and has no symptoms of dyspnea, chest pain  or dizziness with aerobic exercise. Marland Kitchen Her baseline BP and pulse are normal.   Advised her that cardiology follow up will be advised if she develops any symptoms previously mentioned

## 2021-12-30 NOTE — Patient Instructions (Signed)
Congratulations on the weight loss!   Return for fasting labs in early April  I'll see you in July

## 2021-12-30 NOTE — Assessment & Plan Note (Addendum)
Diagnosed in 0919,  Complicated by obesity .  She is losing weight steadily  on a low glycemic index diet using  intermittent fasting (12 lbs since jan 1).  Will repeat A1c and fasting labs in April   Lab Results  Component Value Date   HGBA1C 7.0 (H) 09/24/2021   Lab Results  Component Value Date   LABMICR See below: 10/26/2020   LABMICR 6.0 07/28/2014   MICROALBUR <0.7 09/24/2021   MICROALBUR <0.7 07/04/2020

## 2021-12-30 NOTE — Progress Notes (Signed)
.  Subjective:  Patient ID: Katelyn Wilson, female    DOB: [birth year]  Age: 46 y.o. MRN: [id number]  CC: The primary encounter diagnosis was Family history of hypertrophic cardiomyopathy. Diagnoses of Pernicious anemia, Type 2 diabetes mellitus with other specified complication, without long-term current use of insulin ([place]), Cough variant asthma, and Morbid obesity ([place]) were also pertinent to this visit.   This visit occurred during the SARS-CoV-2 public health emergency.  Safety protocols were in place, including screening questions prior to the visit, additional usage of staff PPE, and extensive cleaning of exam room while observing appropriate contact time as indicated for disinfecting solutions.    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 Checking  blood sugars less than once daily at variable times, usually only if she feels she may be having a hypoglycemic event. .  BS have been under 130 fasting and < 150 post prandially.  Denies any recent hypoglyemic events.  Taking   medications as directed. Following a carbohydrate modified diet 6 days per week. Denies numbness, burning and tingling of extremities. Appetite is good.    Obesity :  she has been motivated to lose weight by the success of a friend who lost > 50 lbs by following a commerically available weight loss program  called E2M  that includes an 8 week diet that utilizes intermittent fasting,  carbohydrate restriction,  and elimination of foods considered to be inflammatory.  Started jan 2.  Feels great currently after surviving the first week of detox symptoms .  Has lost 12 lbs since Jan 1   Family History of HOCM:   her father and two paternal aunts died early  due to HOCM.   She  has been  asymptomatic but underwent genetic testing and EP evaluation in 2012. last EKG was preoperatively in 2019.  Last ECHO 2012.     Outpatient Medications Prior to Visit  Medication Sig Dispense Refill   cetirizine (ZYRTEC) 10 MG tablet Take 10 mg by mouth as needed.     cyanocobalamin (,VITAMIN B-12,) 1000 MCG/ML injection Use as directed 10 mL 0   SYRINGE-NEEDLE, DISP, 3 ML 25G X 1" 3 ML MISC Use as directed to administer B12 injections 50 each 0   Vitamin D, Ergocalciferol, (DRISDOL) 50000 units CAPS capsule Take 1 capsule (50,000 Units total) by mouth 2 (two) times a week. 30 capsule 2   albuterol (VENTOLIN HFA) 108 (90 Base) MCG/ACT inhaler INHALE 2 PUFFS INTO THE LUNGS EVERY SIX HOURS AS NEEDED FOR WHEEZING OR SHORTNESS OF BREATH 18 g 1   Black Cohosh-SoyIsoflav-Magnol (ESTROVEN MENOPAUSE RELIEF) CAPS Take by mouth.     Cyanocobalamin (VITAMIN B-12) 1000 MCG SUBL Place 1,000 mcg under the tongue daily.     tirzepatide [facility]) 2.5 MG/0.5ML Pen Inject 2.5 mg into the skin once a week. 2 mL 2   Facility-Administered Medications Prior to Visit  Medication Dose Route Frequency Provider Last Rate Last Admin   cyanocobalamin ((VITAMIN B-12)) injection 1,000 mcg  1,000 mcg Intramuscular Once Shella Maxim, NP        Review of Systems;  Patient denies headache, fevers, malaise, unintentional weight loss, skin rash, eye pain, sinus congestion and sinus pain, sore  throat, dysphagia,  hemoptysis , cough, dyspnea, wheezing, chest pain, palpitations, orthopnea, edema, abdominal pain, nausea, melena, diarrhea, constipation, flank pain, dysuria, hematuria, urinary  Frequency, nocturia, numbness, tingling, seizures,  Focal weakness, Loss of consciousness,  Tremor, insomnia, depression, anxiety, and suicidal ideation.      Objective:  BP 130/80 (BP Location: Left Arm, Patient Position: Sitting, Cuff Size: Normal)    Pulse 92    Temp 98.1 F (36.7 C) (Oral)    Ht 5\' 4"  (1.626 m)    Wt 228 lb (103.4 kg)     LMP 12/25/2021 (Exact Date)    SpO2 98%    BMI 39.14 kg/m   BP Readings from Last 3 Encounters:  12/30/21 130/80  09/24/21 112/80  08/06/21 122/85    Wt Readings from Last 3 Encounters:  12/30/21 228 lb (103.4 kg)  09/24/21 234 lb (106.1 kg)  08/06/21 233 lb 9.6 oz (106 kg)    General appearance: alert, cooperative and appears stated age Ears: normal TM's and external ear canals both ears Throat: lips, mucosa, and tongue normal; teeth and gums normal Neck: no adenopathy, no carotid bruit, supple, symmetrical, trachea midline and thyroid not enlarged, symmetric, no tenderness/mass/nodules Back: symmetric, no curvature. ROM normal. No CVA tenderness. Lungs: clear to auscultation bilaterally Heart: regular rate and rhythm, S1, S2 normal, no murmur, click, rub or gallop Abdomen: soft, non-tender; bowel sounds normal; no masses,  no organomegaly Pulses: 2+ and symmetric Skin: Skin color, texture, turgor normal. No rashes or lesions Lymph nodes: Cervical, supraclavicular, and axillary nodes normal.  Lab Results  Component Value Date   HGBA1C 7.0 (H) 09/24/2021   HGBA1C 6.7 (H) 10/23/2020   HGBA1C 6.7 (H) 06/13/2020    Lab Results  Component Value Date   CREATININE 0.70 09/24/2021   CREATININE 0.66 10/23/2020   CREATININE 0.74 07/04/2020    Lab Results  Component Value Date   WBC 5.1 09/24/2021   HGB 8.6 Repeated and verified X2. (L) 09/24/2021   HCT 28.2 (L) 09/24/2021   PLT 412.0 (H) 09/24/2021   GLUCOSE 119 (H) 09/24/2021   CHOL 152 09/24/2021   TRIG 90.0 09/24/2021   HDL 46.80 09/24/2021   LDLDIRECT 118 06/13/2016   LDLCALC 87 09/24/2021   ALT 9 09/24/2021   AST 11 09/24/2021   NA 135 09/24/2021   K 4.1 09/24/2021   CL 104 09/24/2021   CREATININE 0.70 09/24/2021   BUN 11 09/24/2021   CO2 24 09/24/2021   TSH 1.63 06/13/2020   INR 1.0 10/23/2020   HGBA1C 7.0 (H) 09/24/2021   MICROALBUR <0.7 09/24/2021    MM 3D SCREEN BREAST BILATERAL  Result Date:  09/03/2021 CLINICAL DATA:  Screening. EXAM: DIGITAL SCREENING BILATERAL MAMMOGRAM WITH TOMOSYNTHESIS AND CAD TECHNIQUE: Bilateral screening digital craniocaudal and mediolateral oblique mammograms were obtained. Bilateral screening digital breast tomosynthesis was performed. The images were evaluated with computer-aided detection. COMPARISON:  Previous exam(s). ACR Breast Density Category b: There are scattered areas of fibroglandular density. FINDINGS: There are no findings suspicious for malignancy. IMPRESSION: No mammographic evidence of malignancy. A result letter of this screening mammogram will be mailed directly to the patient. RECOMMENDATION: Screening mammogram in one year. (Code:SM-B-01Y) BI-RADS CATEGORY  1: Negative. Electronically Signed   By: Lajean Manes M.D.   On: 09/03/2021 09:36   Assessment & Plan:   Problem List Items Addressed This Visit     Family history of hypertrophic cardiomyopathy - Primary    Genetic testing and ECHO via EP consultation with Dr  Caryl Comes in 2012 were both negative for HOCM .  On today's exam, she had a Grade 2 systolic murmur  that was non radiating. I have ordered and reviewed a 12 lead EKG , compared it to the previous one done in 2019 , and find that there are no acute changes and patient is in sinus rhythm. She does not meet criteria for LVH .    She is exercising regularly and has no symptoms of dyspnea, chest pain  or dizziness with aerobic exercise. Marland Kitchen Her baseline BP and pulse are normal.   Advised her that cardiology follow up will be advised if she develops any symptoms previously mentioned       Relevant Orders   EKG 12-Lead (Completed)   Morbid obesity ([place])    With type 2 DM diagnosed in  2019.   . I have congratulated her in reduction of   BMI and encouraged  Continued weight loss with goal of 10% of body weigh over the next 6 months using a low glycemic index diet and regular exercise a minimum of 5 days per week.   She has deferred  pharmacotherapy with Ozempic at this time since she is losing weight without it.        Cough variant asthma    She has been asymptomatic with exercise and reports no baseline symptoms in months . Last pulmonology follow up was in 2017 and GERD treatment was recommended       Pernicious anemia    She has been advised to continue lifelong parenteral supplementation       Relevant Orders   CBC with Differential/Platelet   Type 2 diabetes mellitus with other specified complication ([place])    Diagnosed in 2376,  Complicated by obesity .  She is losing weight steadily  on a low glycemic index diet using  intermittent fasting (12 lbs since jan 1).  Will repeat A1c and fasting labs in April   Lab Results  Component Value Date   HGBA1C 7.0 (H) 09/24/2021   Lab Results  Component Value Date   LABMICR See below: 10/26/2020   LABMICR 6.0 07/28/2014   MICROALBUR <0.7 09/24/2021   MICROALBUR <0.7 07/04/2020          Relevant Orders   EKG 12-Lead (Completed)   Comprehensive metabolic panel   Hemoglobin A1c   LDL cholesterol, direct   Lipid panel   Microalbumin / creatinine urine ratio    I spent 30 minutes dedicated to the care of this patient on the date of this encounter to include pre-visit review of patient's medical history,  most recent imaging studies, Face-to-face time with the patient , and post visit ordering of testing and therapeutics.    Follow-up: No follow-ups on file.   Crecencio Mc, MD

## 2021-12-31 NOTE — Assessment & Plan Note (Addendum)
With type 2 DM diagnosed in  2019.   . I have congratulated her in reduction of   BMI and encouraged  Continued weight loss with goal of 10% of body weigh over the next 6 months using a low glycemic index diet and regular exercise a minimum of 5 days per week.   She has deferred pharmacotherapy with Ozempic at this time since she is losing weight without it.

## 2021-12-31 NOTE — Assessment & Plan Note (Signed)
She has been advised to continue lifelong parenteral supplementation

## 2021-12-31 NOTE — Assessment & Plan Note (Signed)
She has been asymptomatic with exercise and reports no baseline symptoms in months . Last pulmonology follow up was in 2017 and GERD treatment was recommended

## 2022-01-01 ENCOUNTER — Encounter: Payer: Self-pay | Admitting: Certified Nurse Midwife

## 2022-03-10 ENCOUNTER — Other Ambulatory Visit (INDEPENDENT_AMBULATORY_CARE_PROVIDER_SITE_OTHER): Payer: No Typology Code available for payment source

## 2022-03-10 DIAGNOSIS — D510 Vitamin B12 deficiency anemia due to intrinsic factor deficiency: Secondary | ICD-10-CM | POA: Diagnosis not present

## 2022-03-10 DIAGNOSIS — E1169 Type 2 diabetes mellitus with other specified complication: Secondary | ICD-10-CM

## 2022-03-10 DIAGNOSIS — D500 Iron deficiency anemia secondary to blood loss (chronic): Secondary | ICD-10-CM

## 2022-03-10 LAB — COMPREHENSIVE METABOLIC PANEL
ALT: 7 U/L (ref 0–35)
AST: 11 U/L (ref 0–37)
Albumin: 3.9 g/dL (ref 3.5–5.2)
Alkaline Phosphatase: 40 U/L (ref 39–117)
BUN: 8 mg/dL (ref 6–23)
CO2: 24 mEq/L (ref 19–32)
Calcium: 8.8 mg/dL (ref 8.4–10.5)
Chloride: 103 mEq/L (ref 96–112)
Creatinine, Ser: 0.73 mg/dL (ref 0.40–1.20)
GFR: 100.03 mL/min (ref >60.00)
Glucose, Bld: 112 mg/dL — ABNORMAL HIGH (ref 70–99)
Potassium: 4.3 mEq/L (ref 3.5–5.1)
Sodium: 135 mEq/L (ref 135–145)
Total Bilirubin: 0.3 mg/dL (ref 0.2–1.2)
Total Protein: 6.9 g/dL (ref 6.0–8.3)

## 2022-03-10 LAB — HEMOGLOBIN A1C: Hgb A1c MFr Bld: 6.6 % — ABNORMAL HIGH (ref 4.6–6.5)

## 2022-03-10 LAB — CBC WITH DIFFERENTIAL/PLATELET
Basophils Absolute: 0.1 10*3/uL (ref 0.0–0.1)
Basophils Relative: 1 % (ref 0.0–3.0)
Eosinophils Absolute: 0.1 10*3/uL (ref 0.0–0.7)
Eosinophils Relative: 2 % (ref 0.0–5.0)
HCT: 28.5 % — ABNORMAL LOW (ref 36.0–46.0)
Hemoglobin: 8.8 g/dL — ABNORMAL LOW (ref 12.0–15.0)
Lymphocytes Relative: 17.1 % (ref 12.0–46.0)
Lymphs Abs: 1 10*3/uL (ref 0.7–4.0)
MCHC: 31 g/dL (ref 30.0–36.0)
MCV: 68.8 fl — ABNORMAL LOW (ref 78.0–100.0)
Monocytes Absolute: 0.4 10*3/uL (ref 0.1–1.0)
Monocytes Relative: 6.8 % (ref 3.0–12.0)
Neutro Abs: 4.4 10*3/uL (ref 1.4–7.7)
Neutrophils Relative %: 73.1 % (ref 43.0–77.0)
Platelets: 441 10*3/uL — ABNORMAL HIGH (ref 150.0–400.0)
RBC: 4.14 Mil/uL (ref 3.87–5.11)
RDW: 18.6 % — ABNORMAL HIGH (ref 11.5–15.5)
WBC: 6 10*3/uL (ref 4.0–10.5)

## 2022-03-10 LAB — LDL CHOLESTEROL, DIRECT: Direct LDL: 100 mg/dL

## 2022-03-10 LAB — MICROALBUMIN / CREATININE URINE RATIO
Creatinine,U: 39 mg/dL
Microalb Creat Ratio: 1.8 mg/g (ref 0.0–30.0)
Microalb, Ur: <0.7 mg/dL (ref 0.0–1.9)

## 2022-03-10 LAB — LIPID PANEL
Cholesterol: 159 mg/dL (ref 0–200)
HDL: 50 mg/dL (ref >39.00)
LDL Cholesterol: 89 mg/dL (ref 0–99)
NonHDL: 109.3
Total CHOL/HDL Ratio: 3
Triglycerides: 102 mg/dL (ref 0.0–149.0)
VLDL: 20.4 mg/dL (ref 0.0–40.0)

## 2022-03-12 ENCOUNTER — Encounter: Payer: Self-pay | Admitting: Internal Medicine

## 2022-03-12 DIAGNOSIS — D500 Iron deficiency anemia secondary to blood loss (chronic): Secondary | ICD-10-CM | POA: Insufficient documentation

## 2022-03-12 NOTE — Assessment & Plan Note (Signed)
No improvement. In 6 moths  ? ?Lab Results  ?Component Value Date  ? WBC 6.0 03/10/2022  ? HGB 8.8 Repeated and verified X2. (L) 03/10/2022  ? HCT 28.5 (L) 03/10/2022  ? MCV 68.8 Repeated and verified X2. (L) 03/10/2022  ? PLT 441.0 (H) 03/10/2022  ? ? ?

## 2022-04-17 ENCOUNTER — Encounter: Payer: Self-pay | Admitting: Internal Medicine

## 2022-04-30 ENCOUNTER — Telehealth (INDEPENDENT_AMBULATORY_CARE_PROVIDER_SITE_OTHER): Payer: No Typology Code available for payment source | Admitting: Internal Medicine

## 2022-04-30 ENCOUNTER — Encounter: Payer: Self-pay | Admitting: Internal Medicine

## 2022-04-30 VITALS — Ht 64.0 in | Wt 217.0 lb

## 2022-04-30 DIAGNOSIS — M25831 Other specified joint disorders, right wrist: Secondary | ICD-10-CM | POA: Diagnosis not present

## 2022-04-30 NOTE — Progress Notes (Unsigned)
Virtual Visit via [facility] Note  This visit type was conducted due to national recommendations for restrictions regarding the COVID-19 pandemic (e.g. social distancing).  This format is felt to be most appropriate for this patient at this time.  All issues noted in this document were discussed and addressed.  No physical exam was performed (except for noted visual exam findings with Video Visits).   I connected withNAME@ on 04/30/22 at  2:30 PM EDT by a video enabled telemedicine application  and verified that I am speaking with the correct person using two identifiers. Location patient: home Location provider: work or home office Persons participating in the virtual visit: patient, provider  I discussed the limitations, risks, security and privacy concerns of performing an evaluation and management service by telephone and the availability of in person appointments. I also discussed with the patient that there may be a patient responsible charge related to this service. The patient expressed understanding and agreed to proceed.  Reason for visit: inflammatory mass removed from right wrist  HPI:  46 yr old AA female presents for follow up on after having a mass excised from the  radial side of her right wrist.  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  There is a paternal FH of cardiomyopathy with long QT  syndrome   The incision has healed well and she has full mobility of wrist    ROS: See pertinent positives and negatives per HPI.  Past Medical History:  Diagnosis Date   Acid reflux    allergic rhinitis    Anxiety    Asthma    allergy induced   Hallux abducto valgus, left    Hypertension    no meds at this time   Obesity    Obesity (BMI 30-39.9)    Pre-diabetes    Prediabetes    Vitamin D deficiency     Past Surgical History:  Procedure Laterality Date   Barbie Banner OSTEOTOMY Left 11/29/2018   Procedure: Barbie Banner OSTEOTOMY;  Surgeon: Landis Martins, DPM;  Location: [facility];  Service: Podiatry;  Laterality: Left;   Barbie Banner OSTEOTOMY Right 11/05/2020   Procedure: [employer];  Surgeon: Landis Martins, DPM;  Location: [facility];  Service: Podiatry;  Laterality: Right;   BREAST BIOPSY Left 2019   venus clip, [place] bx, fibroadenoma   BUNIONECTOMY Left 11/29/2018   Procedure: Jeralene Huff BUNIONECTOMY LEFT FOOT;  Surgeon: Landis Martins, DPM;  Location: [facility];  Service: Podiatry;  Laterality: Left;   BUNIONECTOMY Right 11/05/2020   Procedure: RIGHT BUNIONECTOMY;  Surgeon: Landis Martins, DPM;  Location: [facility];  Service: Podiatry;  Laterality: Right;  BLOCK   none     WISDOM TOOTH EXTRACTION      Family History  Problem Relation Age of Onset   Heart disease Paternal Aunt 1       sudden death   Heart disease Father  sudden cardiac death   Diabetes Father    Hypertension Father    Sleep apnea Father    Diabetes Other    Lung cancer Other    Cancer Other    Diabetes Mother    Sleep apnea Mother    Obesity Mother    Cancer Maternal Grandmother    Cancer Maternal Grandfather    Breast cancer Neg Hx     SOCIAL HX:  reports that she has never smoked. She has never used smokeless tobacco. She reports current alcohol use. She reports that she does not use drugs.    Current Outpatient Medications:     albuterol (VENTOLIN HFA) 108 (90 Base) MCG/ACT inhaler, INHALE 2 PUFFS INTO THE LUNGS EVERY SIX HOURS AS NEEDED FOR WHEEZING OR SHORTNESS OF BREATH, Disp: 18 g, Rfl: 1   cetirizine (ZYRTEC) 10 MG tablet, Take 10 mg by mouth as needed., Disp: , Rfl:    cyanocobalamin (,VITAMIN B-12,) 1000 MCG/ML injection, Use as directed, Disp: 10 mL, Rfl: 0   SYRINGE-NEEDLE, DISP, 3 ML 25G X 1" 3 ML MISC, Use as directed to administer B12 injections, Disp: 50 each, Rfl: 0   Vitamin D, Ergocalciferol, (DRISDOL) 50000 units CAPS capsule, Take 1 capsule (50,000 Units total) by mouth 2 (two) times a week., Disp: 30 capsule, Rfl: 2  Current Facility-Administered Medications:    cyanocobalamin ((VITAMIN B-12)) injection 1,000 mcg, 1,000 mcg, Intramuscular, Once, Shella Maxim, NP  EXAM:  VITALS per patient if applicable:  GENERAL: alert, oriented, appears well and in no acute distress  HEENT: atraumatic, conjunttiva clear, no obvious abnormalities on inspection of external nose and ears  NECK: normal movements of the head and neck  LUNGS: on inspection no signs of respiratory distress, breathing rate appears normal, no obvious gross SOB, gasping or wheezing  CV: no obvious cyanosis  MS: moves all visible extremities without noticeable abnormality  PSYCH/NEURO: pleasant and cooperative, no obvious depression or anxiety, speech and thought processing grossly intact  ASSESSMENT AND PLAN:  Discussed the following assessment and plan:  Mass of joint of right wrist - Plan: Sedimentation rate, C-reactive protein, Cyclic Citrul Peptide Antibody, IGG, ANA, Uric acid, Rheumatoid Factor, Ambulatory referral to Rheumatology, CANCELED: Ambulatory referral to Rheumatology  Mass of joint of right wrist Excised by Dr Jackqulyn Livings. Path report suggestive of RA.  All stains were  negative for fungi, bacteria. Refer to rheumatology ,  Screening labs ordered: uric acid, ESR and CRP are normal.  RF and ANA still pending      I discussed the assessment and treatment plan with the patient. The patient was provided an opportunity to ask questions and all were answered. The patient agreed with the plan and demonstrated an understanding of the instructions.   The patient was advised to call back or seek an in-person evaluation if the symptoms worsen or if the condition fails to improve as anticipated.   I spent 30 minutes dedicated to the care of this patient on the date of this encounter to include pre-visit review of her medical history,  er recent surgery and corresponding path report. Face-to-face time with the patient , and post visit ordering of testing and therapeutics.    Crecencio Mc, MD

## 2022-05-01 ENCOUNTER — Other Ambulatory Visit (INDEPENDENT_AMBULATORY_CARE_PROVIDER_SITE_OTHER): Payer: No Typology Code available for payment source

## 2022-05-01 ENCOUNTER — Encounter: Payer: Self-pay | Admitting: Internal Medicine

## 2022-05-01 DIAGNOSIS — M25831 Other specified joint disorders, right wrist: Secondary | ICD-10-CM | POA: Diagnosis not present

## 2022-05-01 LAB — URIC ACID: Uric Acid, Serum: 4.1 mg/dL (ref 2.4–7.0)

## 2022-05-01 LAB — SEDIMENTATION RATE: Sed Rate: 49 mm/hr — ABNORMAL HIGH (ref 0–20)

## 2022-05-01 LAB — C-REACTIVE PROTEIN: CRP: <1 mg/dL (ref 0.5–20.0)

## 2022-05-01 NOTE — Assessment & Plan Note (Addendum)
Excised by Dr Jackqulyn Livings. Path report suggestive of RA.  All stains were  negative for fungi, bacteria. Refer to rheumatology ,  Screening labs ordered: uric acid, ESR and CRP are normal.  RF and ANA still pending

## 2022-05-01 NOTE — Telephone Encounter (Signed)
I have pended referral. Needs to be associated with a diagnosis and signed.

## 2022-05-03 LAB — RHEUMATOID FACTOR: Rheumatoid fact SerPl-aCnc: <14 IU/mL (ref <14)

## 2022-05-03 LAB — ANTI-NUCLEAR AB-TITER (ANA TITER)
ANA TITER: 1:160 {titer} — ABNORMAL HIGH
ANA Titer 1: 1:40 {titer} — ABNORMAL HIGH

## 2022-05-03 LAB — ANA: Anti Nuclear Antibody (ANA): POSITIVE — AB

## 2022-05-03 LAB — CYCLIC CITRUL PEPTIDE ANTIBODY, IGG: Cyclic Citrullin Peptide Ab: 211 UNITS — ABNORMAL HIGH

## 2022-05-13 ENCOUNTER — Telehealth: Payer: No Typology Code available for payment source | Admitting: Internal Medicine

## 2022-05-21 ENCOUNTER — Other Ambulatory Visit: Payer: Self-pay | Admitting: Internal Medicine

## 2022-05-21 MED ORDER — CYANOCOBALAMIN 1000 MCG/ML IJ SOLN: INTRAMUSCULAR | 0 refills | Status: AC

## 2022-06-23 ENCOUNTER — Ambulatory Visit (INDEPENDENT_AMBULATORY_CARE_PROVIDER_SITE_OTHER): Payer: No Typology Code available for payment source | Admitting: Internal Medicine

## 2022-06-23 ENCOUNTER — Encounter: Payer: Self-pay | Admitting: Internal Medicine

## 2022-06-23 VITALS — BP 130/68 | HR 90 | Temp 98.4°F | Resp 21 | Ht 64.0 in | Wt 225.0 lb

## 2022-06-23 DIAGNOSIS — E1169 Type 2 diabetes mellitus with other specified complication: Secondary | ICD-10-CM | POA: Diagnosis not present

## 2022-06-23 DIAGNOSIS — D500 Iron deficiency anemia secondary to blood loss (chronic): Secondary | ICD-10-CM | POA: Diagnosis not present

## 2022-06-23 DIAGNOSIS — D510 Vitamin B12 deficiency anemia due to intrinsic factor deficiency: Secondary | ICD-10-CM

## 2022-06-23 DIAGNOSIS — D259 Leiomyoma of uterus, unspecified: Secondary | ICD-10-CM

## 2022-06-23 NOTE — Patient Instructions (Signed)
No changes to meds today unless your iron deficiency has resolved  I'll check a1c so you won't have to return for 6 months  If you plateau before you reach your goal with weight let me know
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
## 2022-06-24 LAB — IBC + FERRITIN
Ferritin: 3 ng/mL — ABNORMAL LOW (ref 10.0–291.0)
Iron: 14 ug/dL — ABNORMAL LOW (ref 42–145)
Saturation Ratios: 2.7 % — ABNORMAL LOW (ref 20.0–50.0)
TIBC: 509.6 ug/dL — ABNORMAL HIGH (ref 250.0–450.0)
Transferrin: 364 mg/dL — ABNORMAL HIGH (ref 212.0–360.0)

## 2022-06-24 LAB — CBC WITH DIFFERENTIAL/PLATELET
Basophils Absolute: 0 10*3/uL (ref 0.0–0.1)
Basophils Relative: 1 % (ref 0.0–3.0)
Eosinophils Absolute: 0.2 10*3/uL (ref 0.0–0.7)
Eosinophils Relative: 3.2 % (ref 0.0–5.0)
HCT: 32.1 % — ABNORMAL LOW (ref 36.0–46.0)
Hemoglobin: 10.3 g/dL — ABNORMAL LOW (ref 12.0–15.0)
Lymphocytes Relative: 26.4 % (ref 12.0–46.0)
Lymphs Abs: 1.2 10*3/uL (ref 0.7–4.0)
MCHC: 32 g/dL (ref 30.0–36.0)
MCV: 73.9 fl — ABNORMAL LOW (ref 78.0–100.0)
Monocytes Absolute: 0.4 10*3/uL (ref 0.1–1.0)
Monocytes Relative: 9.4 % (ref 3.0–12.0)
Neutro Abs: 2.8 10*3/uL (ref 1.4–7.7)
Neutrophils Relative %: 60 % (ref 43.0–77.0)
Platelets: 339 10*3/uL (ref 150.0–400.0)
RBC: 4.35 Mil/uL (ref 3.87–5.11)
RDW: 18.9 % — ABNORMAL HIGH (ref 11.5–15.5)
WBC: 4.7 10*3/uL (ref 4.0–10.5)

## 2022-06-24 LAB — COMPREHENSIVE METABOLIC PANEL
ALT: 10 U/L (ref 0–35)
AST: 12 U/L (ref 0–37)
Albumin: 4.1 g/dL (ref 3.5–5.2)
Alkaline Phosphatase: 47 U/L (ref 39–117)
BUN: 10 mg/dL (ref 6–23)
CO2: 25 mEq/L (ref 19–32)
Calcium: 9 mg/dL (ref 8.4–10.5)
Chloride: 103 mEq/L (ref 96–112)
Creatinine, Ser: 0.76 mg/dL (ref 0.40–1.20)
GFR: 95.12 mL/min (ref >60.00)
Glucose, Bld: 116 mg/dL — ABNORMAL HIGH (ref 70–99)
Potassium: 3.9 mEq/L (ref 3.5–5.1)
Sodium: 136 mEq/L (ref 135–145)
Total Bilirubin: 0.2 mg/dL (ref 0.2–1.2)
Total Protein: 7.3 g/dL (ref 6.0–8.3)

## 2022-06-24 LAB — HEMOGLOBIN A1C: Hgb A1c MFr Bld: 6.7 % — ABNORMAL HIGH (ref 4.6–6.5)

## 2022-06-24 LAB — B12 AND FOLATE PANEL
Folate: 12.9 ng/mL (ref >5.9)
Vitamin B-12: 766 pg/mL (ref 211–911)

## 2022-06-24 NOTE — Assessment & Plan Note (Signed)
Causing menorrhagia and IDA

## 2022-06-24 NOTE — Assessment & Plan Note (Signed)
With type 2 DM diagnosed in  2019.   . I have congratulated her in reduction of   BMI and encouraged  Continued weight loss with goal of 10% of body weight over the next 6 months using a low glycemic index diet and regular exercise a minimum of 5 days per week.   She has deferred pharmacotherapy with Ozempic at this time since she is losing weight without it.

## 2022-06-24 NOTE — Assessment & Plan Note (Signed)
She is taking parenteral injections. Repeat level is due  Lab Results  Component Value Date   VITAMINB12 227 09/24/2021

## 2022-06-24 NOTE — Assessment & Plan Note (Addendum)
Diagnosed in 4114,  Complicated by obesity .  She is losing weight steadily  on a low glycemic index diet using  intermittent fasting (12 lbs since jan 1).  Will repeat A1c today; she has declined statin therapy for now given her premenopausal status and LDL < 100    Lab Results  Component Value Date   HGBA1C 6.6 (H) 03/10/2022   Lab Results  Component Value Date   LABMICR See below: 10/26/2020   LABMICR 6.0 07/28/2014   MICROALBUR <0.7 03/10/2022   MICROALBUR <0.7 09/24/2021

## 2022-06-24 NOTE — Assessment & Plan Note (Signed)
Repeat iron studies are due .  She has been taking a ferrous gluconate supplement since last visit.  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

## 2022-08-12 ENCOUNTER — Encounter: Payer: 59 | Admitting: Certified Nurse Midwife

## 2022-08-26 ENCOUNTER — Encounter: Payer: 59 | Admitting: Certified Nurse Midwife

## 2022-08-30 ENCOUNTER — Telehealth: Payer: No Typology Code available for payment source | Admitting: Nurse Practitioner

## 2022-08-30 DIAGNOSIS — U071 COVID-19: Secondary | ICD-10-CM

## 2022-08-30 MED ORDER — BENZONATATE 100 MG PO CAPS: 100.0000 mg | ORAL_CAPSULE | Freq: Two times a day (BID) | ORAL | 0 refills | Status: AC | PRN

## 2022-08-30 NOTE — Progress Notes (Signed)

## 2022-09-02 ENCOUNTER — Encounter: Payer: Self-pay | Admitting: Internal Medicine

## 2022-12-24 ENCOUNTER — Ambulatory Visit: Payer: No Typology Code available for payment source | Admitting: Internal Medicine

## 2023-02-05 ENCOUNTER — Ambulatory Visit: Payer: No Typology Code available for payment source | Admitting: Internal Medicine

## 2023-03-10 ENCOUNTER — Ambulatory Visit: Payer: No Typology Code available for payment source | Admitting: Internal Medicine

## 2024-03-01 ENCOUNTER — Encounter: Payer: Self-pay | Admitting: *Deleted
